# Patient Record
Sex: Male | Born: 1950
Health system: Southern US, Community
[De-identification: ages and names within clinical notes are randomized; demographics above are authoritative.]

## PROBLEM LIST (undated history)

## (undated) DIAGNOSIS — G4733 Obstructive sleep apnea (adult) (pediatric): Secondary | ICD-10-CM

## (undated) DIAGNOSIS — I2510 Atherosclerotic heart disease of native coronary artery without angina pectoris: Secondary | ICD-10-CM

## (undated) DIAGNOSIS — I219 Acute myocardial infarction, unspecified: Secondary | ICD-10-CM

## (undated) DIAGNOSIS — M5136 Other intervertebral disc degeneration, lumbar region: Secondary | ICD-10-CM

## (undated) DIAGNOSIS — M545 Low back pain, unspecified: Secondary | ICD-10-CM

## (undated) DIAGNOSIS — F419 Anxiety disorder, unspecified: Secondary | ICD-10-CM

## (undated) DIAGNOSIS — F1120 Opioid dependence, uncomplicated: Secondary | ICD-10-CM

## (undated) DIAGNOSIS — G8929 Other chronic pain: Secondary | ICD-10-CM

## (undated) DIAGNOSIS — E7800 Pure hypercholesterolemia, unspecified: Secondary | ICD-10-CM

## (undated) DIAGNOSIS — N200 Calculus of kidney: Secondary | ICD-10-CM

## (undated) DIAGNOSIS — Z8679 Personal history of other diseases of the circulatory system: Secondary | ICD-10-CM

## (undated) DIAGNOSIS — Z9989 Dependence on other enabling machines and devices: Secondary | ICD-10-CM

## (undated) DIAGNOSIS — I499 Cardiac arrhythmia, unspecified: Secondary | ICD-10-CM

## (undated) DIAGNOSIS — I10 Essential (primary) hypertension: Secondary | ICD-10-CM

## (undated) DIAGNOSIS — M51369 Other intervertebral disc degeneration, lumbar region without mention of lumbar back pain or lower extremity pain: Secondary | ICD-10-CM

## (undated) DIAGNOSIS — I340 Nonrheumatic mitral (valve) insufficiency: Secondary | ICD-10-CM

## (undated) DIAGNOSIS — F329 Major depressive disorder, single episode, unspecified: Secondary | ICD-10-CM

## (undated) DIAGNOSIS — F32A Depression, unspecified: Secondary | ICD-10-CM

## (undated) DIAGNOSIS — I5189 Other ill-defined heart diseases: Secondary | ICD-10-CM

## (undated) DIAGNOSIS — M109 Gout, unspecified: Secondary | ICD-10-CM

## (undated) DIAGNOSIS — Z87442 Personal history of urinary calculi: Secondary | ICD-10-CM

## (undated) HISTORY — PX: COLONOSCOPY: SHX174

## (undated) HISTORY — PX: CYST EXCISION: SHX5701

## (undated) HISTORY — PX: CYSTOSCOPY W/ STONE MANIPULATION: SHX1427

---

## 1988-04-08 DIAGNOSIS — M5127 Other intervertebral disc displacement, lumbosacral region: Secondary | ICD-10-CM | POA: Insufficient documentation

## 1998-10-05 ENCOUNTER — Ambulatory Visit (HOSPITAL_BASED_OUTPATIENT_CLINIC_OR_DEPARTMENT_OTHER): Admission: RE | Admit: 1998-10-05 | Discharge: 1998-10-05 | Payer: Self-pay | Admitting: *Deleted

## 1999-05-30 ENCOUNTER — Ambulatory Visit (HOSPITAL_COMMUNITY): Admission: RE | Admit: 1999-05-30 | Discharge: 1999-05-30 | Payer: Self-pay | Admitting: Orthopedic Surgery

## 1999-05-30 ENCOUNTER — Encounter: Payer: Self-pay | Admitting: Orthopedic Surgery

## 2007-06-21 ENCOUNTER — Ambulatory Visit (HOSPITAL_BASED_OUTPATIENT_CLINIC_OR_DEPARTMENT_OTHER): Admission: RE | Admit: 2007-06-21 | Discharge: 2007-06-21 | Payer: Self-pay | Admitting: Otolaryngology

## 2007-06-28 ENCOUNTER — Ambulatory Visit: Payer: Self-pay | Admitting: Internal Medicine

## 2008-07-18 ENCOUNTER — Emergency Department (HOSPITAL_COMMUNITY): Admission: EM | Admit: 2008-07-18 | Discharge: 2008-07-19 | Payer: Self-pay | Admitting: Emergency Medicine

## 2008-07-19 ENCOUNTER — Inpatient Hospital Stay (HOSPITAL_COMMUNITY): Admission: AD | Admit: 2008-07-19 | Discharge: 2008-07-20 | Payer: Self-pay | Admitting: *Deleted

## 2008-07-19 ENCOUNTER — Ambulatory Visit: Payer: Self-pay | Admitting: *Deleted

## 2010-07-18 LAB — COMPREHENSIVE METABOLIC PANEL
ALT: 54 U/L — ABNORMAL HIGH (ref 0–53)
AST: 36 U/L (ref 0–37)
Albumin: 3.9 g/dL (ref 3.5–5.2)
Alkaline Phosphatase: 58 U/L (ref 39–117)
CO2: 27 mEq/L (ref 19–32)
Chloride: 104 mEq/L (ref 96–112)
GFR calc Af Amer: >60 mL/min (ref >60)
Potassium: 3.6 mEq/L (ref 3.5–5.1)
Total Bilirubin: 0.9 mg/dL (ref 0.3–1.2)

## 2010-07-18 LAB — ACETAMINOPHEN LEVEL: Acetaminophen (Tylenol), Serum: <10 ug/mL — ABNORMAL LOW (ref 10–30)

## 2010-07-18 LAB — CBC: Platelets: 146 10*3/uL — ABNORMAL LOW (ref 150–400)

## 2010-07-18 LAB — RAPID URINE DRUG SCREEN, HOSP PERFORMED
Amphetamines: NOT DETECTED
Barbiturates: NOT DETECTED
Benzodiazepines: POSITIVE — AB
Cocaine: NOT DETECTED
Opiates: POSITIVE — AB
Tetrahydrocannabinol: NOT DETECTED

## 2010-07-18 LAB — ETHANOL: Alcohol, Ethyl (B): 47 mg/dL — ABNORMAL HIGH (ref 0–10)

## 2010-08-21 NOTE — Procedures (Signed)
NAME:  Terry Cook, Terry Cook               ACCOUNT NO.:  [id number]   MEDICAL RECORD NO.:  [id number]          PATIENT TYPE:  OUT   LOCATION:  SLEEP CENTER                 FACILITY:  [facility]   PHYSICIAN:  Clinton D. Maple Hudson, MD, FCCP, FACPDATE OF BIRTH:  1950/10/24   DATE OF STUDY:  06/21/2007                            NOCTURNAL POLYSOMNOGRAM   REFERRING PHYSICIAN:  Antony Contras, MD   INDICATIONS FOR PROCEDURE:  Hypersomnia with sleep apnea.   RESULTS:  Epward sleepiness score 11/24, BMI 27.7, weight 177 pounds,  height 67 inches, neck 17 inches.   HOME MEDICATIONS:  Charted and reviewed.   SLEEP ARCHITECTURE:  Split study protocol. During the diagnostic phase,  total sleep time was 125.5 minutes with sleep efficiency 97.3%. Stage 1  was 6.8%; Stage 2 was 93.2%. Stage 3 was absent. REM was absent. Sleep  latency 3 minutes. Awake after sleep onset 0.5 minutes. Arousal index  10.5. Bedtime mediation Ambien.   RESPIRATORY DATA:  Apnea hypopnea index (AHI) 58.8, indicating severe  obstructive sleep apnea/hypopnea syndrome before CPAP. This included 9  obstructive apnea's and 114 hypopnea's before CPAP. CPAP was titrated to  12 CWP, AHI 3.2 per hour. He chose a medium Mirage Quattro mask with  heated humidifier.   OXYGEN DATA:  Loud snoring with oxygen desaturation to a nadir of 74%  before CPAP. After CPAP control, saturation held 95.9% on room air.   CARDIAC DATA:  Sinus rhythm.   MOVEMENT/PARASOMNIA:  No significant movement disturbance. No bathroom  trips.   IMPRESSION/RECOMMENDATIONS:  1. Severe obstructive sleep apnea/hypopnea syndrome, AHI 58.8 per      hour, with most events recorded while supine. Loud snoring with      oxygen desaturation to a nadir of 74%.  2. Successful CPAP titration to 12 CWP, AHI 3.2 per hour. The patient      chose a medium Mirage Quattro mask with heated humidifier.     Clinton D. Maple Hudson, MD, [facility], FACP  Diplomate, Biomedical engineer of Sleep Medicine  Electronically Signed    CDY/MEDQ  D:  06/27/2007 10:55:10  T:  06/27/2007 12:07:33  Job:  [id number]

## 2010-08-24 NOTE — Discharge Summary (Signed)
NAME:  Terry Cook, Terry Cook NO.:  [id number]   MEDICAL RECORD NO.:  [id number]          PATIENT TYPE:  IPS   LOCATION:  0305                          FACILITY:  [facility]   PHYSICIAN:  Jasmine Pang, [name] DATE OF BIRTH:  09-02-1950   DATE OF ADMISSION:  07/19/2008  DATE OF DISCHARGE:  07/20/2008                               DISCHARGE SUMMARY   IDENTIFICATION:  This is a 61 year old white male from [place], who  was admitted due to being suicidal.   HISTORY OF PRESENT ILLNESS:  The patient stated he has occupational  stressors.  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  He also has very large debt.  He  took Xanax and thought it would relax him to go through with suicide  plan.  He states that he came to the realization that he cannot commit  suicide.  He will have to let his family know about the financial  problems.  He very much wanted to go home today.  For further admission  information, see psychiatric admission assessment.   PHYSICAL FINDINGS:  There were no acute physical or medical problems  noted.   HOSPITAL COURSE:  Upon admission, the patient was restarted on his  simvastatin 80 mg daily and Benicar/hydrochlorothiazide 40/25 mg daily  and aspirin 81 mg daily.  He was also started on Ambien 5 mg p.o. q.h.s.  p.r.n. and Librium 25 mg p.o. q.4 h. p.r.n. symptoms of withdrawal and  Motrin 400 mg p.o. q.6 h. p.r.n. pain.  It was also ordered that he will  use a CPAP machine.  On July 20, 2008, the patient was started on  alprazolam 0.5 mg p.o. q.i.d. p.r.n.  He states this has helped him at  home.  The Ambien was increased to 10 mg p.o. q.h.s. and was also  started on Celexa 20 mg p.o. q. day.  In individual sessions, the  patient again states he came to realization that he cannot commit  suicide.  He does not want to kill himself.  He realizes that he is  going to have to let his family know about the financial problems.  Social worker spoke with the patient's wife and  she was only comfortable  with the patient coming home if he was on antidepressant.  She also  wanted a family session, which was done on July 20, 2008.  Counselor  met with the patient and his wife.  Wife confirmed the gun has been  removed from the home.  She was adamant that he needed to be on  medication for depression and that he needed to be in therapy when he  leaves.  He agreed to this.  Wife then felt safe with him coming home  and realized that he was not going to get significant one-to-one therapy  while in the hospital as she had originally thought.   DISCHARGE DIAGNOSES:  Axis I:  Major depression, recurrent, severe  without psychosis.  Axis II:  None.  Axis III:  Hypercholesterolemia, hypertension.  Axis IV:  Severe (problems with financial stressors, problems with  primary support group, burden  of psychiatric illness).  Axis V:  Global assessment of functioning was 50 upon discharge.  GAF  was 40 upon admission.  GAF highest past year was 75.   DISCHARGE PLAN:  There was no specific activity level or dietary  restrictions.   POSTHOSPITAL CARE PLANS:  The patient will go to the [facility]  Intensive Outpatient Program on April 15th at 8:45 a.m.   DISCHARGE MEDICATIONS:  1. Aspirin 81 mg daily.  2. Benicar 40 mg/hydrochlorothiazide daily.  3. Simvastatin 80 mg daily.  4. Alprazolam as directed.      Jasmine Pang, [name]  Electronically Signed     BHS/MEDQ  D:  08/01/2008  T:  08/02/2008  Job:  [id number]

## 2011-05-10 DIAGNOSIS — I219 Acute myocardial infarction, unspecified: Secondary | ICD-10-CM

## 2011-05-10 HISTORY — PX: CORONARY ANGIOPLASTY WITH STENT PLACEMENT: SHX49

## 2011-05-10 HISTORY — DX: Acute myocardial infarction, unspecified: I21.9

## 2011-06-03 ENCOUNTER — Inpatient Hospital Stay (HOSPITAL_COMMUNITY)
Admission: EM | Admit: 2011-06-03 | Discharge: 2011-06-06 | DRG: 247 | Disposition: A | Discharge status: Home / Self Care | Payer: Self-pay | Attending: Cardiology | Admitting: Cardiology

## 2011-06-03 ENCOUNTER — Encounter (HOSPITAL_COMMUNITY): Payer: Self-pay | Admitting: *Deleted

## 2011-06-03 ENCOUNTER — Emergency Department (HOSPITAL_COMMUNITY): Payer: Self-pay

## 2011-06-03 ENCOUNTER — Other Ambulatory Visit: Payer: Self-pay

## 2011-06-03 DIAGNOSIS — E876 Hypokalemia: Secondary | ICD-10-CM | POA: Diagnosis not present

## 2011-06-03 DIAGNOSIS — N138 Other obstructive and reflux uropathy: Secondary | ICD-10-CM | POA: Diagnosis present

## 2011-06-03 DIAGNOSIS — N401 Enlarged prostate with lower urinary tract symptoms: Secondary | ICD-10-CM | POA: Diagnosis present

## 2011-06-03 DIAGNOSIS — Z7982 Long term (current) use of aspirin: Secondary | ICD-10-CM

## 2011-06-03 DIAGNOSIS — F411 Generalized anxiety disorder: Secondary | ICD-10-CM | POA: Diagnosis present

## 2011-06-03 DIAGNOSIS — E785 Hyperlipidemia, unspecified: Secondary | ICD-10-CM

## 2011-06-03 DIAGNOSIS — Z7902 Long term (current) use of antithrombotics/antiplatelets: Secondary | ICD-10-CM

## 2011-06-03 DIAGNOSIS — Z79899 Other long term (current) drug therapy: Secondary | ICD-10-CM

## 2011-06-03 DIAGNOSIS — D696 Thrombocytopenia, unspecified: Secondary | ICD-10-CM | POA: Diagnosis not present

## 2011-06-03 DIAGNOSIS — I2119 ST elevation (STEMI) myocardial infarction involving other coronary artery of inferior wall: Principal | ICD-10-CM

## 2011-06-03 DIAGNOSIS — Z6831 Body mass index (BMI) 31.0-31.9, adult: Secondary | ICD-10-CM

## 2011-06-03 DIAGNOSIS — I214 Non-ST elevation (NSTEMI) myocardial infarction: Secondary | ICD-10-CM

## 2011-06-03 DIAGNOSIS — E669 Obesity, unspecified: Secondary | ICD-10-CM | POA: Diagnosis present

## 2011-06-03 DIAGNOSIS — R079 Chest pain, unspecified: Secondary | ICD-10-CM

## 2011-06-03 DIAGNOSIS — R339 Retention of urine, unspecified: Secondary | ICD-10-CM | POA: Diagnosis present

## 2011-06-03 DIAGNOSIS — Z8249 Family history of ischemic heart disease and other diseases of the circulatory system: Secondary | ICD-10-CM

## 2011-06-03 DIAGNOSIS — I10 Essential (primary) hypertension: Secondary | ICD-10-CM | POA: Diagnosis present

## 2011-06-03 DIAGNOSIS — Z955 Presence of coronary angioplasty implant and graft: Secondary | ICD-10-CM

## 2011-06-03 DIAGNOSIS — I2510 Atherosclerotic heart disease of native coronary artery without angina pectoris: Secondary | ICD-10-CM

## 2011-06-03 DIAGNOSIS — G4733 Obstructive sleep apnea (adult) (pediatric): Secondary | ICD-10-CM

## 2011-06-03 HISTORY — DX: Atherosclerotic heart disease of native coronary artery without angina pectoris: I25.10

## 2011-06-03 HISTORY — DX: Obstructive sleep apnea (adult) (pediatric): Z99.89

## 2011-06-03 HISTORY — DX: Essential (primary) hypertension: I10

## 2011-06-03 HISTORY — DX: Obstructive sleep apnea (adult) (pediatric): G47.33

## 2011-06-03 HISTORY — DX: Pure hypercholesterolemia, unspecified: E78.00

## 2011-06-03 LAB — POCT I-STAT, CHEM 8
Creatinine, Ser: 1.1 mg/dL (ref 0.50–1.35)
HCT: 47 % (ref 39.0–52.0)
Hemoglobin: 16 g/dL (ref 13.0–17.0)
Sodium: 141 mEq/L (ref 135–145)
TCO2: 32 mmol/L (ref 0–100)

## 2011-06-03 LAB — CBC
MCH: 32.2 pg (ref 26.0–34.0)
MCHC: 35.7 g/dL (ref 30.0–36.0)
Platelets: 124 10*3/uL — ABNORMAL LOW (ref 150–400)
RDW: 12.7 % (ref 11.5–15.5)

## 2011-06-03 LAB — POCT I-STAT TROPONIN I: Troponin i, poc: 6.08 ng/mL (ref 0.00–0.08)

## 2011-06-03 MED ORDER — HEPARIN (PORCINE) IN NACL 100-0.45 UNIT/ML-% IJ SOLN
1200.0000 [IU]/h | INTRAMUSCULAR | Status: DC
  Administered 2011-06-03: 1000 [IU]/h via INTRAVENOUS
  Filled 2011-06-03 (×2): qty 250

## 2011-06-03 MED ORDER — ZOLPIDEM TARTRATE 5 MG PO TABS: 10.0000 mg | ORAL_TABLET | Freq: Once | ORAL | Status: DC

## 2011-06-03 MED ORDER — CITALOPRAM HYDROBROMIDE 20 MG PO TABS
20.0000 mg | ORAL_TABLET | Freq: Once | ORAL | Status: AC
  Administered 2011-06-04: 20 mg via ORAL
  Filled 2011-06-03: qty 1

## 2011-06-03 MED ORDER — HEPARIN BOLUS VIA INFUSION
4000.0000 [IU] | Freq: Once | INTRAVENOUS | Status: AC
  Administered 2011-06-03: 4000 [IU] via INTRAVENOUS

## 2011-06-03 MED ORDER — POTASSIUM CHLORIDE CRYS ER 20 MEQ PO TBCR
20.0000 meq | EXTENDED_RELEASE_TABLET | Freq: Once | ORAL | Status: AC
  Administered 2011-06-04: 20 meq via ORAL
  Filled 2011-06-03: qty 1

## 2011-06-03 MED ORDER — LOSARTAN POTASSIUM-HCTZ 100-25 MG PO TABS: 1.0000 | ORAL_TABLET | Freq: Every day | ORAL | Status: DC

## 2011-06-03 MED ORDER — HYDROCHLOROTHIAZIDE 12.5 MG PO CAPS
25.0000 mg | ORAL_CAPSULE | Freq: Once | ORAL | Status: AC
  Administered 2011-06-04: 25 mg via ORAL
  Filled 2011-06-03 (×2): qty 2

## 2011-06-03 MED ORDER — CITALOPRAM HYDROBROMIDE 20 MG PO TABS
20.0000 mg | ORAL_TABLET | Freq: Every day | ORAL | Status: DC
  Administered 2011-06-04 – 2011-06-06 (×3): 20 mg via ORAL
  Filled 2011-06-03 (×3): qty 1

## 2011-06-03 MED ORDER — LOSARTAN POTASSIUM 50 MG PO TABS
100.0000 mg | ORAL_TABLET | Freq: Once | ORAL | Status: AC
  Administered 2011-06-04: 100 mg via ORAL
  Filled 2011-06-03: qty 2

## 2011-06-03 MED ORDER — HEPARIN SODIUM (PORCINE) 5000 UNIT/ML IJ SOLN
INTRAMUSCULAR | Status: AC
  Administered 2011-06-03: 5000 [IU]
  Filled 2011-06-03: qty 1

## 2011-06-03 MED ORDER — ZOLPIDEM TARTRATE 5 MG PO TABS: 10.0000 mg | ORAL_TABLET | Freq: Every day | ORAL | Status: DC

## 2011-06-03 MED ORDER — NITROGLYCERIN 0.4 MG SL SUBL
0.4000 mg | SUBLINGUAL_TABLET | SUBLINGUAL | Status: AC | PRN
  Administered 2011-06-03 (×5): 0.4 mg via SUBLINGUAL

## 2011-06-03 MED ORDER — HYDROCODONE-ACETAMINOPHEN 5-325 MG PO TABS
1.0000 | ORAL_TABLET | Freq: Four times a day (QID) | ORAL | Status: DC | PRN
  Administered 2011-06-03: 1 via ORAL
  Filled 2011-06-03: qty 1

## 2011-06-03 NOTE — H&P (Addendum)
See dictated note # [id number].  Patient with NSTEMi now Cp free.  Admit to CCU.  Cath in am.  On ASA and Heparin. Add Integrillin, metoprolol and statin     Patient seen and examined.  No interval change in history and exam since last note   Stable for procedure.  Darden Palmer. MD [facility]  06/04/2011

## 2011-06-03 NOTE — ED Provider Notes (Signed)
History     CSN: [id number]  Arrival date & time 06/03/11  1702   First MD Initiated Contact with Patient 06/03/11 1932      Chief Complaint  Patient presents with  . Chest Pain    Patient is a 61 y.o. male presenting with chest pain. The history is provided by the patient.  Chest Pain The chest pain began 3 - 5 days ago. Duration of episode(s) is 24 hours. Chest pain occurs frequently. The chest pain is worsening. The pain is associated with exertion. The severity of the pain is moderate. The quality of the pain is described as pressure-like. The pain does not radiate. Chest pain is worsened by exertion. Primary symptoms include shortness of breath, nausea and dizziness.  Dizziness also occurs with nausea and diaphoresis.  Associated symptoms include diaphoresis.    Pt reports episodes of chest pain for past 3 days, with the last episode >24 hours He reports diaphoresis/nausea/SOB He took an ASA today He denies known h/o CAD He denies abd pain No pleuritic type pain No recent surgery/travel No recent rectal bleeding reported Past Medical History  Diagnosis Date  . Hypertension   . Sleep apnea     No past surgical history on file.  History reviewed. No pertinent family history.  History  Substance Use Topics  . Smoking status: Never Smoker   . Smokeless tobacco: Not on file  . Alcohol Use: No      Review of Systems  Constitutional: Positive for diaphoresis.  Respiratory: Positive for shortness of breath.   Cardiovascular: Positive for chest pain.  Gastrointestinal: Positive for nausea.  Neurological: Positive for dizziness.  All other systems reviewed and are negative.    Allergies  Review of patient's allergies indicates no known allergies.  Home Medications   Current Outpatient Rx  Name Route Sig Dispense Refill  . ASPIRIN EC 81 MG PO TBEC Oral Take 81 mg by mouth daily.    Marland Kitchen CITALOPRAM HYDROBROMIDE 20 MG PO TABS Oral Take 20 mg by mouth daily.    Marland Kitchen  HYDROCODONE-ACETAMINOPHEN 5-500 MG PO TABS Oral Take 1-2 tablets by mouth every 4 (four) hours as needed. For pain    . LOSARTAN POTASSIUM-HCTZ 100-25 MG PO TABS Oral Take 1 tablet by mouth daily.    Marland Kitchen SIMVASTATIN 40 MG PO TABS Oral Take 40 mg by mouth at bedtime.    Marland Kitchen ZOLPIDEM TARTRATE 10 MG PO TABS Oral Take 10 mg by mouth at bedtime.      BP 161/101  Pulse 67  Resp 20  SpO2 98% CONSTITUTIONAL: Well developed/well nourished HEAD AND FACE: Normocephalic/atraumatic EYES: EOMI/PERRL ENMT: Mucous membranes moist NECK: supple no meningeal signs SPINE:entire spine nontender CV: S1/S2 noted, no murmurs/rubs/gallops noted LUNGS: Lungs are clear to auscultation bilaterally, no apparent distress ABDOMEN: soft, nontender, no rebound or guarding GU:no cva tenderness NEURO: Pt is awake/alert, moves all extremitiesx4 EXTREMITIES: pulses normal, full ROM, no edema SKIN: warm, color normal PSYCH: no abnormalities of mood noted  Physical Exam  ED Course  Procedures   CRITICAL CARE Performed by: Joya Gaskins   Total critical care time: 35  Critical care time was exclusive of separately billable procedures and treating other patients.  Critical care was necessary to treat or prevent imminent or life-threatening deterioration.  Critical care was time spent personally by me on the following activities: development of treatment plan with patient and/or surrogate as well as nursing, discussions with consultants, evaluation of patient's response to treatment, examination of  patient, obtaining history from patient or surrogate, ordering and performing treatments and interventions, ordering and review of laboratory studies, ordering and review of radiographic studies, pulse oximetry and re-evaluation of patient's condition.   Labs Reviewed  CBC - Abnormal; Notable for the following:    WBC 11.3 (*)    Platelets 124 (*)    All other components within normal limits  POCT I-STAT, CHEM 8 -  Abnormal; Notable for the following:    Potassium 3.4 (*)    All other components within normal limits  POCT I-STAT TROPONIN I - Abnormal; Notable for the following:    Troponin i, poc 6.08 (*)    All other components within normal limits  APTT   Dg Chest 2 View  06/03/2011  *RADIOLOGY REPORT*  Clinical Data: Chest pain.  Night sweats.  CHEST - 2 VIEW  Comparison: None.  Findings: Two views of the chest were obtained. Heart and mediastinum are within normal limits.  The trachea is midline.  No evidence for edema or pleural effusions.  Mild degenerative changes in the thoracic spine.  IMPRESSION: No acute chest findings.  Original Report Authenticated By: Richarda Overlie, [name]   8:02 PM Pt with Non-stemi NTG/heparin ordered Pt has already taken ASA today [facility]  8:46 PM D/w dr Donnie Aho, will see patient Pt stabilized in the ED  8:54 PM Patient reports his pain is improved Vitals improved D/w his wife/updated family     MDM  Nursing notes reviewed and considered in documentation All labs/vitals reviewed and considered xrays reviewed and considered     Date: 06/03/2011  Rate: 76  Rhythm: normal sinus rhythm  QRS Axis: normal  Intervals: normal  ST/T Wave abnormalities: nonspecific ST changes  Conduction Disutrbances:none  Narrative Interpretation:   Old EKG Reviewed: unchanged    Joya Gaskins, MD 06/03/11 2054

## 2011-06-03 NOTE — ED Notes (Signed)
Pt reports that he is pain free at this time after NTG x3 doses.  Denies N/V and headache.

## 2011-06-03 NOTE — ED Notes (Signed)
Family at bedside. No verbal complaints at this time. Will continue to monitor.

## 2011-06-03 NOTE — ED Notes (Signed)
Pt reports that he uses CPAP at home but did not bring it with him.  Called RT to inquire as to whether or not he needed to have someone to deliver it to the hospital.  RT assured me that there would be one available when the pt gets to a room. Informed pt.

## 2011-06-03 NOTE — ED Notes (Signed)
To ed for eval of cp and night sweats. States pain started 3 days ago. Describes pain as intermittent from crushing to a knife. Skin w/d, resp e/u. Poor historian.

## 2011-06-03 NOTE — ED Notes (Signed)
Pt reports that his CP is beginning to return and is now at 4/10.

## 2011-06-03 NOTE — Progress Notes (Addendum)
ANTICOAGULATION CONSULT NOTE - Initial Consult  Pharmacy Consult for Heparin, Integrilin Indication: Chest pain, ACS  No Known Allergies  Patient Measurements:   Heparin Dosing Weight: 81kg 78ft 6in 178lb, 81 kg   Vital Signs: BP: 161/101 mmHg (02/25 1933) Pulse Rate: 67  (02/25 1933)  Labs:  Basename 06/03/11 1844 06/03/11 1804  HGB 16.0 15.8  HCT 47.0 44.2  PLT -- 124*  APTT -- --  LABPROT -- --  INR -- --  HEPARINUNFRC -- --  CREATININE 1.10 --  CKTOTAL -- --  CKMB -- --  TROPONINI -- --   CrCl is unknown because there is no height on file for the current visit.  Medical History: Past Medical History  Diagnosis Date  . Hypertension   . Sleep apnea     Medications:   (Not in a hospital admission) Patient notes he has taken up to 8 aspirin daily over the last two days in addition to his naproxen.  Assessment: 61 yo M admitted with chest pain and troponin of 6.  Pharmacy consulted to start heparin.  Patient denies recent bleeding, surgery, bleeding.  Goal of Therapy:  Heparin level 0.3-0.7 units/ml   Plan:  Heparin: 4000 units bolus, 1000 units/hr Check heparin level 6h after ggt starts Daily heparin level and CBC   Juliette Merrily Brittle 06/03/2011,8:07 PM  Addendum: Pharmacy now consulted to manage Integrilin for patient. Estimated CrCl > 50 mL/min.  Will start Integrilin 180 mcg/kg x 1 bolus, then IV infusion at 2 mcg/kg/min.   Lorre Munroe, PharmD 06/03/2011, 00:10 AM.

## 2011-06-03 NOTE — ED Notes (Signed)
No old EKG.

## 2011-06-03 NOTE — ED Notes (Signed)
Pt reports that his CP is now 2/10 and tolerable without associated symptoms.  No diaphoresis noted, pt denies n/v at this time.

## 2011-06-04 ENCOUNTER — Other Ambulatory Visit: Payer: Self-pay

## 2011-06-04 ENCOUNTER — Encounter (HOSPITAL_COMMUNITY): Payer: Self-pay | Admitting: *Deleted

## 2011-06-04 ENCOUNTER — Encounter (HOSPITAL_COMMUNITY)
Admission: EM | Disposition: A | Discharge status: Home / Self Care | Payer: Self-pay | Source: Home / Self Care | Attending: Cardiology

## 2011-06-04 DIAGNOSIS — E785 Hyperlipidemia, unspecified: Secondary | ICD-10-CM

## 2011-06-04 DIAGNOSIS — I2510 Atherosclerotic heart disease of native coronary artery without angina pectoris: Secondary | ICD-10-CM

## 2011-06-04 DIAGNOSIS — Z955 Presence of coronary angioplasty implant and graft: Secondary | ICD-10-CM

## 2011-06-04 HISTORY — DX: Atherosclerotic heart disease of native coronary artery without angina pectoris: I25.10

## 2011-06-04 HISTORY — PX: LEFT HEART CATHETERIZATION WITH CORONARY ANGIOGRAM: SHX5451

## 2011-06-04 LAB — CARDIAC PANEL(CRET KIN+CKTOT+MB+TROPI)
CK, MB: 40.1 ng/mL (ref 0.3–4.0)
CK, MB: 22.7 ng/mL (ref 0.3–4.0)
Relative Index: 4 — ABNORMAL HIGH (ref 0.0–2.5)
Total CK: 671 U/L — ABNORMAL HIGH (ref 7–232)
Total CK: 561 U/L — ABNORMAL HIGH (ref 7–232)

## 2011-06-04 LAB — CBC
HCT: 41.2 % (ref 39.0–52.0)
Hemoglobin: 14.6 g/dL (ref 13.0–17.0)
Hemoglobin: 14.7 g/dL (ref 13.0–17.0)
MCH: 32.3 pg (ref 26.0–34.0)
MCHC: 35.4 g/dL (ref 30.0–36.0)
MCV: 90.7 fL (ref 78.0–100.0)
MCV: 91.2 fL (ref 78.0–100.0)
Platelets: 128 10*3/uL — ABNORMAL LOW (ref 150–400)
RBC: 4.53 MIL/uL (ref 4.22–5.81)
RDW: 12.8 % (ref 11.5–15.5)
WBC: 10.5 10*3/uL (ref 4.0–10.5)

## 2011-06-04 LAB — LIPID PANEL
Cholesterol: 133 mg/dL (ref 0–200)
Triglycerides: 147 mg/dL (ref <150)

## 2011-06-04 LAB — BASIC METABOLIC PANEL
Calcium: 9.2 mg/dL (ref 8.4–10.5)
GFR calc Af Amer: 88 mL/min — ABNORMAL LOW (ref >90)
GFR calc non Af Amer: 76 mL/min — ABNORMAL LOW (ref >90)
Sodium: 140 mEq/L (ref 135–145)

## 2011-06-04 LAB — PROTIME-INR: Prothrombin Time: 15.7 seconds — ABNORMAL HIGH (ref 11.6–15.2)

## 2011-06-04 SURGERY — LEFT HEART CATHETERIZATION WITH CORONARY ANGIOGRAM
Anesthesia: LOCAL

## 2011-06-04 MED ORDER — ATORVASTATIN CALCIUM 80 MG PO TABS
80.0000 mg | ORAL_TABLET | Freq: Every day | ORAL | Status: DC
  Administered 2011-06-04 – 2011-06-05 (×2): 80 mg via ORAL
  Filled 2011-06-04 (×3): qty 1

## 2011-06-04 MED ORDER — ASPIRIN EC 81 MG PO TBEC
81.0000 mg | DELAYED_RELEASE_TABLET | Freq: Every day | ORAL | Status: DC
  Administered 2011-06-05 – 2011-06-06 (×2): 81 mg via ORAL
  Filled 2011-06-04 (×3): qty 1

## 2011-06-04 MED ORDER — METOPROLOL TARTRATE 12.5 MG HALF TABLET
12.5000 mg | ORAL_TABLET | Freq: Two times a day (BID) | ORAL | Status: DC
  Administered 2011-06-04: 12.5 mg via ORAL
  Filled 2011-06-04 (×2): qty 1

## 2011-06-04 MED ORDER — TICAGRELOR 90 MG PO TABS
90.0000 mg | ORAL_TABLET | Freq: Two times a day (BID) | ORAL | Status: DC
  Administered 2011-06-05 – 2011-06-06 (×3): 90 mg via ORAL
  Filled 2011-06-04 (×4): qty 1

## 2011-06-04 MED ORDER — ZOLPIDEM TARTRATE 5 MG PO TABS: 10.0000 mg | ORAL_TABLET | Freq: Every evening | ORAL | Status: DC | PRN

## 2011-06-04 MED ORDER — SODIUM CHLORIDE 0.9 % IV SOLN: INTRAVENOUS | Status: DC

## 2011-06-04 MED ORDER — ASPIRIN 81 MG PO CHEW
324.0000 mg | CHEWABLE_TABLET | ORAL | Status: AC
  Administered 2011-06-04: 324 mg via ORAL
  Filled 2011-06-04: qty 4

## 2011-06-04 MED ORDER — SODIUM CHLORIDE 0.9 % IV SOLN: 1.0000 mL/kg/h | INTRAVENOUS | Status: AC

## 2011-06-04 MED ORDER — LOSARTAN POTASSIUM 50 MG PO TABS
100.0000 mg | ORAL_TABLET | Freq: Every day | ORAL | Status: DC
  Administered 2011-06-04 – 2011-06-06 (×3): 100 mg via ORAL
  Filled 2011-06-04 (×3): qty 2

## 2011-06-04 MED ORDER — ACETAMINOPHEN 325 MG PO TABS
650.0000 mg | ORAL_TABLET | ORAL | Status: DC | PRN
  Administered 2011-06-04 – 2011-06-05 (×6): 650 mg via ORAL
  Filled 2011-06-04 (×6): qty 2

## 2011-06-04 MED ORDER — HEPARIN BOLUS VIA INFUSION
1000.0000 [IU] | Freq: Once | INTRAVENOUS | Status: AC
  Administered 2011-06-04: 1000 [IU] via INTRAVENOUS
  Filled 2011-06-04: qty 1000

## 2011-06-04 MED ORDER — LIDOCAINE HCL (PF) 1 % IJ SOLN
INTRAMUSCULAR | Status: AC
  Filled 2011-06-04: qty 30

## 2011-06-04 MED ORDER — NITROGLYCERIN 0.4 MG SL SUBL: 0.4000 mg | SUBLINGUAL_TABLET | SUBLINGUAL | Status: DC | PRN

## 2011-06-04 MED ORDER — NITROGLYCERIN IN D5W 200-5 MCG/ML-% IV SOLN
2.0000 ug/min | INTRAVENOUS | Status: DC
Start: 2011-06-04 — End: 2011-06-06

## 2011-06-04 MED ORDER — ZOLPIDEM TARTRATE 5 MG PO TABS
10.0000 mg | ORAL_TABLET | Freq: Every evening | ORAL | Status: DC | PRN
  Administered 2011-06-05 (×2): 10 mg via ORAL
  Filled 2011-06-04: qty 2
  Filled 2011-06-04 (×2): qty 1

## 2011-06-04 MED ORDER — NITROGLYCERIN 0.2 MG/ML ON CALL CATH LAB
INTRAVENOUS | Status: AC
  Filled 2011-06-04: qty 1

## 2011-06-04 MED ORDER — EPTIFIBATIDE 75 MG/100ML IV SOLN: 2.0000 ug/kg/min | INTRAVENOUS | Status: AC

## 2011-06-04 MED ORDER — HEPARIN SODIUM (PORCINE) 1000 UNIT/ML IJ SOLN
INTRAMUSCULAR | Status: AC
  Filled 2011-06-04: qty 1

## 2011-06-04 MED ORDER — ONDANSETRON HCL 4 MG/2ML IJ SOLN: 4.0000 mg | Freq: Four times a day (QID) | INTRAMUSCULAR | Status: DC | PRN

## 2011-06-04 MED ORDER — METOPROLOL TARTRATE 25 MG PO TABS
25.0000 mg | ORAL_TABLET | Freq: Two times a day (BID) | ORAL | Status: DC
  Administered 2011-06-04 – 2011-06-05 (×2): 25 mg via ORAL
  Filled 2011-06-04 (×4): qty 1

## 2011-06-04 MED ORDER — EPTIFIBATIDE BOLUS VIA INFUSION
180.0000 ug/kg | Freq: Once | INTRAVENOUS | Status: AC
  Administered 2011-06-04: 14600 ug via INTRAVENOUS
  Filled 2011-06-04: qty 20

## 2011-06-04 MED ORDER — ACETAMINOPHEN 325 MG PO TABS: 650.0000 mg | ORAL_TABLET | ORAL | Status: DC | PRN

## 2011-06-04 MED ORDER — MIDAZOLAM HCL 2 MG/2ML IJ SOLN
INTRAMUSCULAR | Status: AC
  Filled 2011-06-04: qty 2

## 2011-06-04 MED ORDER — HYDROCHLOROTHIAZIDE 25 MG PO TABS
25.0000 mg | ORAL_TABLET | Freq: Every day | ORAL | Status: DC
  Administered 2011-06-04: 25 mg via ORAL
  Filled 2011-06-04: qty 1

## 2011-06-04 MED ORDER — NITROGLYCERIN IN D5W 200-5 MCG/ML-% IV SOLN
2.0000 ug/min | INTRAVENOUS | Status: DC
  Administered 2011-06-04: 10 ug/min via INTRAVENOUS
  Filled 2011-06-04 (×2): qty 250

## 2011-06-04 MED ORDER — MORPHINE SULFATE 2 MG/ML IJ SOLN
1.0000 mg | INTRAMUSCULAR | Status: DC | PRN
  Administered 2011-06-04: 1 mg via INTRAVENOUS
  Administered 2011-06-04: 2 mg via INTRAVENOUS
  Filled 2011-06-04: qty 1

## 2011-06-04 MED ORDER — HEPARIN (PORCINE) IN NACL 2-0.9 UNIT/ML-% IJ SOLN
INTRAMUSCULAR | Status: AC
  Filled 2011-06-04: qty 2000

## 2011-06-04 MED ORDER — EPTIFIBATIDE 75 MG/100ML IV SOLN
2.0000 ug/kg/min | INTRAVENOUS | Status: DC
  Administered 2011-06-04 (×2): 2 ug/kg/min via INTRAVENOUS
  Filled 2011-06-04 (×6): qty 100

## 2011-06-04 MED ORDER — ALPRAZOLAM 0.25 MG PO TABS
0.2500 mg | ORAL_TABLET | Freq: Two times a day (BID) | ORAL | Status: DC | PRN
  Administered 2011-06-04: 0.25 mg via ORAL
  Filled 2011-06-04: qty 1

## 2011-06-04 MED ORDER — DIAZEPAM 5 MG PO TABS
10.0000 mg | ORAL_TABLET | ORAL | Status: AC
  Administered 2011-06-04: 10 mg via ORAL
  Filled 2011-06-04: qty 2

## 2011-06-04 MED ORDER — ASPIRIN 300 MG RE SUPP
300.0000 mg | RECTAL | Status: AC
  Filled 2011-06-04: qty 1

## 2011-06-04 MED ORDER — NITROGLYCERIN IN D5W 200-5 MCG/ML-% IV SOLN
INTRAVENOUS | Status: AC
  Administered 2011-06-04: 10 ug/min via INTRAVENOUS
  Filled 2011-06-04: qty 250

## 2011-06-04 MED ORDER — MORPHINE SULFATE 2 MG/ML IJ SOLN
INTRAMUSCULAR | Status: AC
  Administered 2011-06-04: 2 mg via INTRAVENOUS
  Filled 2011-06-04: qty 1

## 2011-06-04 NOTE — CV Procedure (Signed)
Cardiac Catheterization Report   Terry Cook  [birth year]  [id number]  06/04/11   PROCEDURE:  Left heart catheterization with selective coronary angiography, left ventriculogram.  INDICATIONS:  Non-ST elevation myocardial infarction, unstable angina multiple cardiac risks  The risks, benefits, and details of the procedure were explained to the patient.  The patient verbalized understanding and wanted to proceed.  Informed written consent was obtained.  PROCEDURE TECHNIQUE:  After Xylocaine anesthesia a 14F sheath was placed in the right femoral artery with a single anterior needle wall stick.   Left coronary angiography was done using a Judkins L4 guide catheter.  Right coronary angiography was done using a Judkins R4 guide catheter.  A 30 cc left ventriculogram was performed in the 30 RAO projection and the digital images were reviewed. Dr. Eldridge Dace felt that he would be a candidate for percutaneous intervention of the circumflex as the right coronary artery appeared occluded and less favorable for percutaneous intervention. Dr. Eldridge Dace then scrubbed into the case and assumed care for the intervention.   CONTRAST:  Total of 65 cc.  COMPLICATIONS:  None.    HEMODYNAMICS: Aortic postcontrast 107/67, left ventriculogram postcontrast 107/14-22 .There was no gradient between the left ventricle and aorta.    ANGIOGRAPHIC DATA:    CORONARY ARTERIES:   Arise and distribute normally.  Right dominant. Mild coronary calcification is noted.  Left main coronary artery: Normal.  Left anterior descending: Appears to be a twin LAD, the more medial branch has only minor irregularities. The more lateral branch has proximal 30-40% stenosis and gives off a diagonal branch. There appears to be a severe 80-90% stenosis in the distal vessel at the apex.  Circumflex coronary artery: First marginal branch a segmental 60% to 80% proximal stenosis. Following the first  marginal branch is a severe eccentric 99% stenosis with some ulceration prior to additional marginal branches. The second marginal branch has a proximal 60% stenosis. A third marginal branch has proximal irregularity. Collateral filling is seen of the right coronary artery off of the atrial branch and through septal collaterals.   Right coronary artery: Mild proximal stenosis noted. The vessel has a mid segment occlusion estimated 1.5 cm after a right ventricular branch. Very faint antegrade flow is noted. Significant collateral filling of the distal vessel is noted off of the right coronary artery.Marland Kitchen  LEFT VENTRICULOGRAM:  Performed in the 30 RAO projection.  The aortic and mitral valves are normal. The left ventricle is normal in size there is akinesis of the proximal mid and distal inferior wall.. Estimated ejection fraction is 45%..   IMPRESSIONS:  1. Severe coronary artery disease with a severe ulcerated stenosis involving the mid circumflex between the first and second marginal branches. Moderate diffuse disease involving the first and second marginal branches. Total occlusion of the right coronary artery with collateral filling via right to right as well as left left collaterals. Distal disease noted in the LAD. 2. Normal left particular function with akinesis of the inferior wall and ejection fraction 45%  RECOMMENDATION:    Discussed with Dr. Eldridge Dace. After review the films it appears that he has had an inferior infarction but appears to be more of a chronic occlusion but is difficult to say. He does have significant collaterals coming from left to left and right to right. We will attempt at stenting of the circumflex stenosis and determine if he has ongoing ischemia prior to its duration of right coronary intervention. He does have distal stenosis involving the LAD also.  Darden Palmer MD [facility]

## 2011-06-04 NOTE — Progress Notes (Signed)
Patient stable post PCI of circumflex with DES.  He has occluded RCA an only mild LAD disease. Inferior hypokinesis.  Currently agitated and c/o prostate pain and bladder pain.  SHeath in place still and on integrillin.  WIll insert foley until can be off of bedrest. Taper NTG.  Darden Palmer MD [facility]

## 2011-06-04 NOTE — Progress Notes (Signed)
Spoke with Dr. Mayford Knife. Ordered to turn off Integrilin at this time.  Perkins, Terry Cook

## 2011-06-04 NOTE — Progress Notes (Signed)
Dr. Donnie Aho paged at [facility] regarding critical values from previous shift.  Terry Cook

## 2011-06-04 NOTE — H&P (Signed)
NAME:  Terry Cook, Terry Cook NO.:  [id number]  MEDICAL RECORD NO.:  [id number]  LOCATION:  PDA10                        FACILITY:  [facility]  PHYSICIAN:  Natasha Bence, MD       DATE OF BIRTH:  02-21-1951  DATE OF ADMISSION:  06/03/2011 DATE OF DISCHARGE:                             HISTORY & PHYSICAL   PRIMARY PHYSICIAN:  [facility].  CHIEF COMPLAINT:  Chest discomfort.  HISTORY OF PRESENT ILLNESS:  Terry Cook is a 62 year old white male with a history of hypertension who developed substernal chest discomfort approximately 3 days ago while doing some work.  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  He initially thought this was musculoskeletal pain, however, later that night started to have several episodes of this pain associated with diaphoresis and mild shortness of breath.  He denies any nausea or lightheadedness.  He had several episodes throughout the next 2 days, and finally decided to come in today primarily because of the associated known diaphoresis.  In the ED, he received 3 sublingual nitroglycerin with complete relief of his pain. He is currently pain free.  Prior to this episode, he had had no episodes of chest discomfort.  However, he does have mild dyspnea on exertion which he attributes to him being out of shape.  He denies any PND or orthopnea.  He does have intermittent palpitations, not had any episodes of syncope.  He denies any melena or hematochezia.  REVIEW OF SYSTEMS:  Otherwise, review of systems as below.  GENERAL:  No fever, chills, or sweats.  SKIN:  No rash or ulcers.  GI:  No diarrhea, constipation, melena, or hematochezia.  CARDIOVASCULAR:  See HPI. RESPIRATORY:  No PND or orthopnea.  GU:  No dysuria, hematuria. MUSCULOSKELETAL:  He has chronic degenerative joint disease of his back. Otherwise, a complete review of systems was performed was  negative except for what is stated above.  PAST MEDICAL HISTORY: 1. Hypertension. 2. Degenerative joint disease. 3. Obstructive sleep apnea on CPAP.  PAST SURGICAL HISTORY:  None.  FAMILY HISTORY:  He has a strong family history of coronary artery disease.  His father died of sudden cardiac death in his 15s.  Brother had an MI with stent placement in his mid 47s.  SOCIAL HISTORY:  He is a Office manager.  He denies any smoking.  He drinks beer rarely.  No illicit drug use.  ALLERGIES:  He has no known drug allergies.  MEDICATIONS:  Home meds: 1. He is on aspirin 81 mg p.o. daily. 2. Celexa 20 mg p.o. daily. 3. Hydrocodone 5/500 mg 1-2 tablets every 4 hours as needed. 4. Hyzaar 100/25 mg p.o. daily. 5. Zolpidem 10 mg p.o. daily. 6. Simvastatin 40 mg p.o. daily.  PHYSICAL EXAMINATION:  VITAL SIGNS:  He is afebrile.  Temperature 99.1, pulse is 67 and regular, blood pressure 161/101, O2 sats 98% on room air, respiratory rate 20. GENERAL:  He is an overweight white male in no apparent distress. EYES:  He has anicteric sclerae. NECK:  He has normal jugular venous pressure.  No carotid  bruits.  No thyromegaly.  LUNGS:  Clear to auscultation bilaterally. CARDIOVASCULAR:  He has a regular rate and rhythm.  No murmurs, rubs, or gallops.  He does have occasional ectopy. ABDOMEN:  Soft, nontender, nondistended.  No masses or bruits. EXTREMITIES:  Warm with no edema.  He has symmetrical pulses throughout. SKIN:  No rash or ulcers. NEURO:  Grossly afocal.  LABORATORY DATA:  Sodium 141, potassium 3.4, chloride 100, BUN of 15, creatinine of 1.1, glucose of 98, calcium of 1.2.  White blood cell count 11.3, hematocrit of 44, platelet count 124.  Point of care troponin was 6.08, PTT was 35.  Chest x-ray showed no acute infiltrates or effusions.  EKG shows sinus mechanism, rate 76 beats per minute.  He has biatrial enlargement.  He has inferior ST-T changes with Q-waves suggestive of  ischemia.  IMPRESSION AND PLAN:  This is a 61 year old hypertensive male who presents with chest discomfort and non ST elevation myocardial infarction.  His primary event probably occurred 2-3 days ago, and he already has q waves in his inferior leads.  Currently he is pain free and on heparin infusion after receiving aspirin in the emergency department.  We will be admitted to CCU on telemetry.  We will follow serial cardiac enzymes.  In the morning, we will obtain echocardiogram and proceed with catheterization to define his coronary anatomy.  We will also add Integrilin infusion tonight and metoprolol in addition to statin.  Currently, he is pain free and hemodynamically stable.  We will keep him on CPAP for his obstructive sleep apnea.          ______________________________ Natasha Bence, MD     MH/MEDQ  D:  06/03/2011  T:  06/04/2011  Job:  [id number]

## 2011-06-04 NOTE — Progress Notes (Signed)
Nursing: Obtained critical values from lab at 0605 am. Paged cardiology resident at [phone] with no response. Called Dr. York Spaniel office and the answering service was given information to call this RN at 0630 am. No response. Called Dr. York Spaniel office again and gave information required to page at 0720. No response. [place] RN made aware of critical values in report.

## 2011-06-04 NOTE — CV Procedure (Signed)
PROCEDURE:  PCI mid left circumflex  INDICATIONS:  NSTEMI   The risks, benefits, and details of the procedure were explained to the patient.  The patient verbalized understanding and wanted to proceed.  Informed written consent was obtained.  PROCEDURE TECHNIQUE:  Dr. Donnie Aho performed a diagnostic catheterization showing a severe, 90% lesion in the midcircumflex.  A 66F sheath was placed in the right femoral artery in exchange for a 5 French sheath..   Left coronary angiography was done using a CLS 3.5 guide catheter.  Heparin was used for anticoagulation.  An ACT was used to confirm that the heparin is therapeutic.  Integrilin was running during the procedures well.  Pro water wire was placed across the stenosis in the midcircumflex.  A 2.5 x 20 Myrtle balloon was placed across the diseased area and inflated to 8 atmospheres.  A 2.75 x 28 Promus element stent was placed across the diseased area and deployed at 14 atmospheres.  The stent was postdilated with a 3.0 x 20 Sac Quantum Apex balloon inflated to 16 atmospheres.  There is no residual stenosis.  Initial TIMI 2 flow was improved to TIMI 3 flow.  Lesion length was 24 mm.  Intracoronary nitroglycerin was administered several times to help treat distal vessel vasospasm.   CONTRAST:  Total of 50  cc.  COMPLICATIONS:  None.       IMPRESSIONS:  1. Successful PCI of the mid circumflex artery with a 2.75 x 28 Promus element stent, postdilated to greater than 3 mm in diameter.  RECOMMENDATION:  The patient will need to take dual antiplatelet therapy for at least a year.  Continue aggressive secondary prevention.  He'll followup with Dr. Donnie Aho.

## 2011-06-04 NOTE — Progress Notes (Signed)
ANTICOAGULATION CONSULT NOTE - Follow Up  Pharmacy Consult for Heparin, Integrilin Indication: Chest pain, ACS  No Known Allergies  Patient Measurements: Weight: 178 lb 9.2 oz (81 kg) Heparin Dosing Weight: 81kg 28ft 6in 178lb, 81 kg  Vital Signs: BP: 102/49 mmHg (02/26 0330) Pulse Rate: 67  (02/26 0330)  Labs:  Basename 06/04/11 0300 06/04/11 0010 06/03/11 1949 06/03/11 1844 06/03/11 1804  HGB -- 14.6 -- 16.0 --  HCT -- 41.2 -- 47.0 44.2  PLT -- 125* -- -- 124*  APTT -- -- 35 -- --  LABPROT -- -- -- -- --  INR -- -- -- -- --  HEPARINUNFRC 0.24* -- -- -- --  CREATININE -- -- -- 1.10 --  CKTOTAL -- -- -- -- --  CKMB -- -- -- -- --  TROPONINI -- -- -- -- --   CrCl is unknown because there is no height on file for the current visit.  Medical History: Past Medical History  Diagnosis Date  . Hypertension   . Sleep apnea     Medications:   (Not in a hospital admission)  Assessment: 61 yo M admitted with chest pain and troponin of 6. Pharmacy consulted to manage heparin and Integrilin.  Heparin level (0.24) is below-goal on 1000 units/hr.   Goal of Therapy:  Heparin level 0.3 - 0.5 (on Integrilin)   Plan:  1. Heparin IV bolus of 1000 units, then Increase IV heparin to 1200 units/hr  2. Heparin level, CBC in 6 hours.   Emeline Gins 06/04/2011,3:40 AM

## 2011-06-04 NOTE — Progress Notes (Signed)
Chaplain attempted initial visit with patient, but patient was not available. Chaplain may attempt an initial visit at a later time.

## 2011-06-04 NOTE — Progress Notes (Signed)
*  PRELIMINARY RESULTS* Echocardiogram 2D Echocardiogram has been performed.  Jeryl Columbia R 06/04/2011, 11:14 AM

## 2011-06-04 NOTE — Progress Notes (Signed)
Subjective:  Pain free now.  EKG with subtle inferior changes.  Objective:  Vital Signs in the last 24 hours: BP 108/79  Pulse 70  Temp(Src) 98.6 F (37 C) (Oral)  Resp 20  Ht 5\' 5"  (1.651 m)  Wt 84.823 kg (187 lb)  BMI 31.12 kg/m2  SpO2 96%  Physical Exam:  Lungs:  Clear to A&P Cardiac:  Regular rhythm, normal S1 and S2, no S3 Extremities:  No edema present, 2+pulses  Intake/Output from previous day: 02/25 0701 - 02/26 0700 In: 124 [I.V.:124] Out: 200 [Urine:200] Weight change:   Lab Results: Basic Metabolic Panel:  Basename 06/04/11 0442 06/03/11 1844  NA 140 141  K 3.8 3.4*  CL 100 100  CO2 34* --  GLUCOSE 95 98  BUN 13 15  CREATININE 1.04 1.10   CBC:  Basename 06/04/11 0010 06/03/11 1844 06/03/11 1804  WBC 10.9* -- 11.3*  NEUTROABS -- -- --  HGB 14.6 16.0 --  HCT 41.2 47.0 --  MCV 91.2 -- 90.2  PLT 125* -- 124*   Cardiac Enzymes:  Basename 06/04/11 0442  CKTOTAL 671*  CKMB 40.1*  CKMBINDEX --  TROPONINI 13.24*    Protime: . Lab Results  Component Value Date   INR 1.22 06/04/2011    Telemetry: Sinus  Assessment/Plan: \1. NSTEMI initial 2. Strong FH 3. Mild thrombocytopenia  Rec:  Cardiac cath later on. Cardiac catheterization was discussed with the patient fully including risks of myocardial infarction, death, stroke, bleeding, arrhythmia, dye allergy, renal insufficiency or bleeding.  The patient understands and is willing to proceed. Possibility of PCI and intervention discussed with patient to be done by interventional cardiology post cath if necessary.  Terry Cook.  MD [facility] 06/04/2011, 9:11 AM

## 2011-06-04 NOTE — Progress Notes (Signed)
UR Completed. Simmons, Aadyn Buchheit F [phone]  

## 2011-06-04 NOTE — ED Notes (Signed)
Pt. Transferred to floor via stretcher with cardiac monitor and RN

## 2011-06-05 LAB — BASIC METABOLIC PANEL
BUN: 11 mg/dL (ref 6–23)
BUN: 11 mg/dL (ref 6–23)
CO2: 25 mEq/L (ref 19–32)
Chloride: 98 mEq/L (ref 96–112)
Creatinine, Ser: 0.83 mg/dL (ref 0.50–1.35)
GFR calc Af Amer: >90 mL/min (ref >90)
GFR calc non Af Amer: 88 mL/min — ABNORMAL LOW (ref >90)
Glucose, Bld: 114 mg/dL — ABNORMAL HIGH (ref 70–99)
Glucose, Bld: 96 mg/dL (ref 70–99)
Potassium: 3 mEq/L — ABNORMAL LOW (ref 3.5–5.1)

## 2011-06-05 LAB — CBC
HCT: 39.6 % (ref 39.0–52.0)
Hemoglobin: 13.9 g/dL (ref 13.0–17.0)
MCHC: 35.1 g/dL (ref 30.0–36.0)

## 2011-06-05 MED ORDER — ASPIRIN 81 MG PO CHEW
CHEWABLE_TABLET | ORAL | Status: AC
  Filled 2011-06-05: qty 1

## 2011-06-05 MED ORDER — POTASSIUM CHLORIDE CRYS ER 20 MEQ PO TBCR
40.0000 meq | EXTENDED_RELEASE_TABLET | Freq: Once | ORAL | Status: AC
  Administered 2011-06-05: 40 meq via ORAL

## 2011-06-05 MED ORDER — ATROPINE SULFATE 1 MG/ML IJ SOLN
INTRAMUSCULAR | Status: AC
  Filled 2011-06-05: qty 1

## 2011-06-05 MED ORDER — POTASSIUM CHLORIDE 20 MEQ/15ML (10%) PO LIQD: 40.0000 meq | Freq: Two times a day (BID) | ORAL | Status: DC

## 2011-06-05 MED ORDER — ENOXAPARIN SODIUM 40 MG/0.4ML ~~LOC~~ SOLN
40.0000 mg | Freq: Every day | SUBCUTANEOUS | Status: DC
  Administered 2011-06-05 – 2011-06-06 (×2): 40 mg via SUBCUTANEOUS
  Filled 2011-06-05 (×2): qty 0.4

## 2011-06-05 MED ORDER — POTASSIUM CHLORIDE CRYS ER 20 MEQ PO TBCR
EXTENDED_RELEASE_TABLET | ORAL | Status: AC
  Filled 2011-06-05: qty 2

## 2011-06-05 MED ORDER — POTASSIUM CHLORIDE 20 MEQ/15ML (10%) PO LIQD
40.0000 meq | Freq: Two times a day (BID) | ORAL | Status: AC
  Administered 2011-06-05: 40 meq via ORAL
  Filled 2011-06-05: qty 30

## 2011-06-05 MED ORDER — METOPROLOL TARTRATE 50 MG PO TABS
50.0000 mg | ORAL_TABLET | Freq: Two times a day (BID) | ORAL | Status: DC
  Administered 2011-06-05 – 2011-06-06 (×2): 50 mg via ORAL
  Filled 2011-06-05 (×3): qty 1

## 2011-06-05 NOTE — Progress Notes (Signed)
Sheath removed from right groin. Site level 0 pre-removal. Manual pressure applied for 25 minutes beginning at 0033 till 0058. Pt tolerated procedure. No adverse effects. Post sheath removal groin level 0. Post cath instructions given, pulses present, pressure dsg applied. Will continue to monitor site. Perkins, [place] Elizabeth

## 2011-06-05 NOTE — Progress Notes (Signed)
Pt. Has order to wear cpap. Pt has his home cpap present. [employer] was notified but was unable to respond. RT inspected pt cords and machine and the machine appears to be intact.

## 2011-06-05 NOTE — Progress Notes (Signed)
Subjective:  Feels much better this am, denies chest pain or SOB.  Has foley in place for urinary retention last pm.   Objective:  Vital Signs in the last 24 hours: BP 138/88  Pulse 80  Temp(Src) 98.6 F (37 C) (Axillary)  Resp 18  Ht 5\' 5"  (1.651 m)  Wt 84.823 kg (187 lb)  BMI 31.12 kg/m2  SpO2 97%  Physical Exam: Pleasant WM in NAD Lungs:  Clear  Cardiac:  Regular rhythm, normal S1 and S2, no S3 Abdomen:  Soft, nontender, no masses Extremities:  Cath site clean and dry  Intake/Output from previous day: 02/26 0701 - 02/27 0700 In: 1874 [P.O.:30; I.V.:1339; IV Piggyback:505] Out: 2575 [Urine:2575]  Lab Results: Basic Metabolic Panel:  Basename 06/05/11 0620 06/04/11 2340  NA 143 137  K 3.0* 3.2*  CL 103 98  CO2 31 25  GLUCOSE 96 114*  BUN 11 11  CREATININE 0.96 0.83   CBC:  Basename 06/05/11 0620 06/04/11 1024  WBC 8.8 10.5  NEUTROABS -- --  HGB 13.9 14.7  HCT 39.6 41.1  MCV 90.8 90.7  PLT 118* 128*   Cardiac Enzymes:  Basename 06/04/11 1625 06/04/11 1024 06/04/11 0442  CKTOTAL 561* 612* 671*  CKMB 22.7* 31.2* 40.1*  CKMBINDEX -- -- --  TROPONINI 20.56* 11.38* 13.24*    Protime: . Lab Results  Component Value Date   INR 1.22 06/04/2011   EKG:  Inferior MI  Telemetry: Reviewed  Sinus rhtyhm  Assessment/Plan:  1. Acute inferior MI 2. S/p DES 3. Urinary retention 4. Hypokalemia 5. Hypertension  REC:  D/C IV NTG, foley, begin rehab, Increase BP meds.  Move to floor.  Darden Palmer.  MD [facility] 06/05/2011, 9:07 AM

## 2011-06-05 NOTE — Progress Notes (Signed)
75CARDIAC REHAB PHASE I   PRE:  Rate/Rhythm: 75 SR  BP:  Supine: 121/82  Sitting:   Standing:    SaO2:   MODE:  Ambulation: 700 ft   POST:  Rate/Rhythem: 86 SR  BP:  Supine:   Sitting: 135/76  Standing:    SaO2:  1335-1430 Pt tolerated ambulation well without c/o of cp or SOB. VS stable. Started discharge education with pt and wife. He agrees to [employer] in [place], will send referral. Pt has no insurance, will ask case manger to see and gave him financial form to fill out for assistance for [facility].  Beatrix Fetters

## 2011-06-06 DIAGNOSIS — I2119 ST elevation (STEMI) myocardial infarction involving other coronary artery of inferior wall: Secondary | ICD-10-CM

## 2011-06-06 LAB — BASIC METABOLIC PANEL
CO2: 30 mEq/L (ref 19–32)
Chloride: 105 mEq/L (ref 96–112)
Glucose, Bld: 93 mg/dL (ref 70–99)
Potassium: 4.1 mEq/L (ref 3.5–5.1)
Sodium: 142 mEq/L (ref 135–145)

## 2011-06-06 MED ORDER — NITROGLYCERIN 0.4 MG SL SUBL: 0.4000 mg | SUBLINGUAL_TABLET | SUBLINGUAL | Status: DC | PRN

## 2011-06-06 MED ORDER — ATORVASTATIN CALCIUM 80 MG PO TABS: 80.0000 mg | ORAL_TABLET | Freq: Every day | ORAL | Status: DC

## 2011-06-06 MED ORDER — ASPIRIN 81 MG PO TBEC: 81.0000 mg | DELAYED_RELEASE_TABLET | Freq: Every day | ORAL | Status: AC

## 2011-06-06 MED ORDER — TICAGRELOR 90 MG PO TABS: 90.0000 mg | ORAL_TABLET | Freq: Two times a day (BID) | ORAL | Status: DC

## 2011-06-06 MED ORDER — METOPROLOL TARTRATE 50 MG PO TABS: 50.0000 mg | ORAL_TABLET | Freq: Two times a day (BID) | ORAL | Status: DC

## 2011-06-06 NOTE — Progress Notes (Signed)
Pt discharge education performed. Pt verbalized understanding. IV's removed, no bleeding noted, catheter intact. D/Ced telemetry

## 2011-06-06 NOTE — Progress Notes (Signed)
[id number] Cardiac Rehab Completed discharge education with pt. He voices understanding.

## 2011-06-06 NOTE — Progress Notes (Signed)
   CARE MANAGEMENT NOTE 06/06/2011  Patient:  Terry Cook, Terry Cook   Account Number:  [id number]  Date Initiated:  06/06/2011  Documentation initiated by:  GRAVES-BIGELOW,Tyresa Prindiville  Subjective/Objective Assessment:   Pt admitted with cp Acute inferior myocardial infarction-initial episode. Plan for home on Brilinta.     Action/Plan:   CM did call [facility] pharmacy and they have a bottle of brilinta available. CM did provide pt with a 30 day free card. Pt stated he will have insurance by April 1st. Pt in process of having house forelocsed and possible bankruptcy.   Anticipated DC Date:  06/06/2011   Anticipated DC Plan:  HOME/SELF CARE      DC Planning Services  CM consult      Choice offered to / List presented to:             Status of service:  Completed, signed off Medicare Important Message given?   (If response is "NO", the following Medicare IM given date fields will be blank) Date Medicare IM given:   Date Additional Medicare IM given:    Discharge Disposition:  HOME/SELF CARE  Per UR Regulation:    Comments:  06-06-11 1440 Tomi Bamberger, RN,BSN [phone] CM did supply pt with pt assist forms and he will take forms  back to MD [facility] office and have him fill out his part and then will fax to co. CM relayed to pt not to miss the medication and he see's that he has nt heard anything from pt assist AZ&ME to call MD and ask for samples.

## 2011-06-06 NOTE — Discharge Summary (Signed)
Physician Discharge Summary  Patient ID: CADENCE HASLAM MRN: [id number] DOB/AGE: January 29, 1951 61 y.o.  Admit date: 06/03/2011 Discharge date: 06/06/2011  Primary Physician: Dr. Juluis Rainier  Primary Discharge Diagnosis: 1. Acute inferior myocardial infarction-initial episode  Secondary Discharge Diagnosis: 2. Hypertension 3. Hyperlipidemia 4. Obesity 5. Sleep apnea 6. Anxiety 7 BPH  Procedures::  1. Cardiac catheterization 2. Drug-eluting stent placement to the midportion of the circumflex with a Promus drug-eluting stent 3. Echocardiogram  Hospital Course: Patient is a 61 year old male with previous history of hypertension and hyperlipidemia as well as sleep apnea. He presented to the emergency room with a three-day history of chest discomfort described as tightness across his chest that is been intermittent and associated with shortness of breath. He presented to the emergency room her he was found L. of elevation of troponin and CPK with minor changes inferiorly. He was admitted to rule out a myocardial infarction.  The patient was placed on heparin and later Integrilin. He was given beta blockers. He had significant elevation of troponin but was pain-free. He was taken to the cardiac catheterization laboratory the next day and was found to have a severe stenosis involving the mid circumflex vessel. Both of the first and second marginal branches were moderately diseased at their proximal portions. The right coronary artery appeared occluded in the midportion and had a long segment of occlusion and appeared more chronic. There is also a severe stenosis in the distal apex of the LAD. Ejection fraction was 40-45% with inferior hypokinesis. Echocardiogram showed a significantly akinetic inferior wall.  After review of the films Dr. Eldridge Dace did PCI with a drug-eluting stent to the mid circumflex. He had no recurrent chest discomfort following that and was moved to the floor. He did  develop urinary retention and required placement of a Foley catheterization following the catheterization. He was ambulatory in the hall and had no recurrent chest discomfort and is discharged home in improved condition. He was seen by cardiac rehabilitation and was given significant instruction concerning activity.  Discharge Exam: Blood pressure 126/80, pulse 59, temperature 97.9 F (36.6 C), temperature source Oral, resp. rate 20, height 5\' 5"  (1.651 m), weight 84.823 kg (187 lb), SpO2 94.00%.   He was stable the day of discharge with clear lungs and a normal cardiac exam. Catheterization site had no ecchymosis  Labs: CBC:   Lab Results  Component Value Date   WBC 8.8 06/05/2011   HGB 13.9 06/05/2011   HCT 39.6 06/05/2011   MCV 90.8 06/05/2011   PLT 118* 06/05/2011   CMP:  Lab 06/06/11 0545  NA 142  K 4.1  CL 105  CO2 30  BUN 15  CREATININE 1.03  CALCIUM 9.5  PROT --  BILITOT --  ALKPHOS --  ALT --  AST --  GLUCOSE 93   Lipid Panel     Component Value Date/Time   CHOL 133 06/04/2011 0442   TRIG 147 06/04/2011 0442   HDL 35* 06/04/2011 0442   CHOLHDL 3.8 06/04/2011 0442   VLDL 29 06/04/2011 0442   LDLCALC 69 06/04/2011 0442   Cardiac Enzymes:  Basename 06/04/11 1625 06/04/11 1024 06/04/11 0442  CKTOTAL 561* 612* 671*  CKMB 22.7* 31.2* 40.1*  CKMBINDEX -- -- --  TROPONINI 20.56* 11.38* 13.24*    Radiology: Clear lung fields, mild degenerative changes in the spine.  EKG: Inferior myocardial infarction that evolved  Discharge Medications:  Rohn, Fritsch  Home Medication Instructions [id number]   Printed on:06/06/11 1006  Medication  Information                    losartan-hydrochlorothiazide (HYZAAR) 100-25 MG per tablet Take 1 tablet by mouth daily.           aspirin EC 81 MG tablet Take 81 mg by mouth daily.           citalopram (CELEXA) 20 MG tablet Take 20 mg by mouth daily.           zolpidem (AMBIEN) 10 MG tablet Take 10 mg by mouth at  bedtime.           aspirin EC 81 MG EC tablet Take 1 tablet (81 mg total) by mouth daily.           atorvastatin (LIPITOR) 80 MG tablet Take 1 tablet (80 mg total) by mouth daily at 6 PM.           metoprolol (LOPRESSOR) 50 MG tablet Take 1 tablet (50 mg total) by mouth 2 (two) times daily.           Ticagrelor (BRILINTA) 90 MG TABS tablet Take 1 tablet (90 mg total) by mouth 2 (two) times daily.           nitroGLYCERIN (NITROSTAT) 0.4 MG SL tablet Place 1 tablet (0.4 mg total) under the tongue every 5 (five) minutes x 3 doses as needed for chest pain.             Followup plans and appointments:  He is to be out of work for 2-3 weeks. He will follow a carbohydrate modified diet of the Mediterranean type such as the [employer]. Followup in one week and consider treadmill testing in 2-3 weeks. Report any recurrent chest discomfort.  Time spent with patient to include physician time: 45 minutes  Signed: W. Ashley Royalty. MD [facility] 06/06/2011, 10:06 AM

## 2011-07-08 DEATH — deceased

## 2012-03-30 ENCOUNTER — Ambulatory Visit (INDEPENDENT_AMBULATORY_CARE_PROVIDER_SITE_OTHER): Payer: BC Managed Care – PPO | Admitting: *Deleted

## 2012-03-30 DIAGNOSIS — I739 Peripheral vascular disease, unspecified: Secondary | ICD-10-CM

## 2013-11-17 ENCOUNTER — Inpatient Hospital Stay (HOSPITAL_COMMUNITY)
Admission: EM | Admit: 2013-11-17 | Discharge: 2013-11-19 | DRG: 247 | Disposition: A | Discharge status: Home / Self Care | Payer: BC Managed Care – PPO | Attending: Cardiology | Admitting: Cardiology

## 2013-11-17 ENCOUNTER — Emergency Department (HOSPITAL_COMMUNITY): Payer: BC Managed Care – PPO

## 2013-11-17 ENCOUNTER — Encounter (HOSPITAL_COMMUNITY): Payer: Self-pay | Admitting: Emergency Medicine

## 2013-11-17 DIAGNOSIS — Z7982 Long term (current) use of aspirin: Secondary | ICD-10-CM

## 2013-11-17 DIAGNOSIS — G4733 Obstructive sleep apnea (adult) (pediatric): Secondary | ICD-10-CM | POA: Diagnosis present

## 2013-11-17 DIAGNOSIS — M5137 Other intervertebral disc degeneration, lumbosacral region: Secondary | ICD-10-CM | POA: Diagnosis present

## 2013-11-17 DIAGNOSIS — I4729 Other ventricular tachycardia: Secondary | ICD-10-CM | POA: Diagnosis not present

## 2013-11-17 DIAGNOSIS — M51379 Other intervertebral disc degeneration, lumbosacral region without mention of lumbar back pain or lower extremity pain: Secondary | ICD-10-CM | POA: Diagnosis present

## 2013-11-17 DIAGNOSIS — E7800 Pure hypercholesterolemia, unspecified: Secondary | ICD-10-CM | POA: Diagnosis present

## 2013-11-17 DIAGNOSIS — M25512 Pain in left shoulder: Secondary | ICD-10-CM

## 2013-11-17 DIAGNOSIS — I200 Unstable angina: Secondary | ICD-10-CM | POA: Diagnosis present

## 2013-11-17 DIAGNOSIS — R079 Chest pain, unspecified: Secondary | ICD-10-CM | POA: Diagnosis present

## 2013-11-17 DIAGNOSIS — Z8249 Family history of ischemic heart disease and other diseases of the circulatory system: Secondary | ICD-10-CM

## 2013-11-17 DIAGNOSIS — Z9861 Coronary angioplasty status: Secondary | ICD-10-CM | POA: Diagnosis not present

## 2013-11-17 DIAGNOSIS — I252 Old myocardial infarction: Secondary | ICD-10-CM

## 2013-11-17 DIAGNOSIS — F411 Generalized anxiety disorder: Secondary | ICD-10-CM | POA: Diagnosis present

## 2013-11-17 DIAGNOSIS — Z87442 Personal history of urinary calculi: Secondary | ICD-10-CM | POA: Diagnosis not present

## 2013-11-17 DIAGNOSIS — Z7902 Long term (current) use of antithrombotics/antiplatelets: Secondary | ICD-10-CM | POA: Diagnosis not present

## 2013-11-17 DIAGNOSIS — I498 Other specified cardiac arrhythmias: Secondary | ICD-10-CM | POA: Diagnosis not present

## 2013-11-17 DIAGNOSIS — E785 Hyperlipidemia, unspecified: Secondary | ICD-10-CM | POA: Diagnosis present

## 2013-11-17 DIAGNOSIS — I10 Essential (primary) hypertension: Secondary | ICD-10-CM | POA: Diagnosis present

## 2013-11-17 DIAGNOSIS — I472 Ventricular tachycardia, unspecified: Secondary | ICD-10-CM | POA: Diagnosis not present

## 2013-11-17 DIAGNOSIS — E876 Hypokalemia: Secondary | ICD-10-CM | POA: Diagnosis not present

## 2013-11-17 DIAGNOSIS — M25511 Pain in right shoulder: Secondary | ICD-10-CM

## 2013-11-17 DIAGNOSIS — F329 Major depressive disorder, single episode, unspecified: Secondary | ICD-10-CM | POA: Diagnosis present

## 2013-11-17 DIAGNOSIS — I2510 Atherosclerotic heart disease of native coronary artery without angina pectoris: Secondary | ICD-10-CM | POA: Diagnosis present

## 2013-11-17 DIAGNOSIS — E663 Overweight: Secondary | ICD-10-CM | POA: Diagnosis present

## 2013-11-17 DIAGNOSIS — Z79899 Other long term (current) drug therapy: Secondary | ICD-10-CM

## 2013-11-17 DIAGNOSIS — F3289 Other specified depressive episodes: Secondary | ICD-10-CM | POA: Diagnosis present

## 2013-11-17 DIAGNOSIS — I2582 Chronic total occlusion of coronary artery: Secondary | ICD-10-CM | POA: Diagnosis present

## 2013-11-17 DIAGNOSIS — Z955 Presence of coronary angioplasty implant and graft: Secondary | ICD-10-CM

## 2013-11-17 HISTORY — DX: Other chronic pain: G89.29

## 2013-11-17 HISTORY — DX: Calculus of kidney: N20.0

## 2013-11-17 HISTORY — DX: Low back pain, unspecified: M54.50

## 2013-11-17 HISTORY — DX: Other intervertebral disc degeneration, lumbar region without mention of lumbar back pain or lower extremity pain: M51.369

## 2013-11-17 HISTORY — DX: Anxiety disorder, unspecified: F41.9

## 2013-11-17 HISTORY — DX: Low back pain: M54.5

## 2013-11-17 HISTORY — DX: Cardiac arrhythmia, unspecified: I49.9

## 2013-11-17 HISTORY — DX: Depression, unspecified: F32.A

## 2013-11-17 HISTORY — DX: Major depressive disorder, single episode, unspecified: F32.9

## 2013-11-17 HISTORY — DX: Acute myocardial infarction, unspecified: I21.9

## 2013-11-17 HISTORY — DX: Other intervertebral disc degeneration, lumbar region: M51.36

## 2013-11-17 LAB — COMPREHENSIVE METABOLIC PANEL
ALBUMIN: 4.1 g/dL (ref 3.5–5.2)
ALT: 25 U/L (ref 0–53)
ANION GAP: 12 (ref 5–15)
AST: 26 U/L (ref 0–37)
Alkaline Phosphatase: 78 U/L (ref 39–117)
BUN: 13 mg/dL (ref 6–23)
CALCIUM: 9.4 mg/dL (ref 8.4–10.5)
CO2: 33 mEq/L — ABNORMAL HIGH (ref 19–32)
CREATININE: 0.96 mg/dL (ref 0.50–1.35)
Chloride: 100 mEq/L (ref 96–112)
GFR calc Af Amer: >90 mL/min (ref >90)
GFR calc non Af Amer: 86 mL/min — ABNORMAL LOW (ref >90)
Glucose, Bld: 132 mg/dL — ABNORMAL HIGH (ref 70–99)
POTASSIUM: 3.1 meq/L — AB (ref 3.7–5.3)
Sodium: 145 mEq/L (ref 137–147)
Total Bilirubin: 0.7 mg/dL (ref 0.3–1.2)
Total Protein: 7.6 g/dL (ref 6.0–8.3)

## 2013-11-17 LAB — HEPATIC FUNCTION PANEL
ALBUMIN: 4.5 g/dL (ref 3.5–5.2)
ALT: 28 U/L (ref 0–53)
AST: 34 U/L (ref 0–37)
Alkaline Phosphatase: 84 U/L (ref 39–117)
Bilirubin, Direct: <0.2 mg/dL (ref 0.0–0.3)
TOTAL PROTEIN: 7.7 g/dL (ref 6.0–8.3)
Total Bilirubin: 0.7 mg/dL (ref 0.3–1.2)

## 2013-11-17 LAB — BASIC METABOLIC PANEL
ANION GAP: 13 (ref 5–15)
BUN: 10 mg/dL (ref 6–23)
CALCIUM: 9.7 mg/dL (ref 8.4–10.5)
CHLORIDE: 96 meq/L (ref 96–112)
CO2: 30 mEq/L (ref 19–32)
CREATININE: 0.92 mg/dL (ref 0.50–1.35)
GFR calc non Af Amer: 88 mL/min — ABNORMAL LOW (ref >90)
Glucose, Bld: 91 mg/dL (ref 70–99)
Potassium: 3.7 mEq/L (ref 3.7–5.3)
SODIUM: 139 meq/L (ref 137–147)

## 2013-11-17 LAB — CBC WITH DIFFERENTIAL/PLATELET
BASOS ABS: 0 10*3/uL (ref 0.0–0.1)
BASOS PCT: 0 % (ref 0–1)
Basophils Absolute: 0 10*3/uL (ref 0.0–0.1)
Basophils Relative: 0 % (ref 0–1)
Eosinophils Absolute: 0.3 10*3/uL (ref 0.0–0.7)
Eosinophils Absolute: 0.2 10*3/uL (ref 0.0–0.7)
Eosinophils Relative: 3 % (ref 0–5)
Eosinophils Relative: 2 % (ref 0–5)
HCT: 46.1 % (ref 39.0–52.0)
HCT: 44.2 % (ref 39.0–52.0)
HEMOGLOBIN: 16.8 g/dL (ref 13.0–17.0)
Hemoglobin: 15.9 g/dL (ref 13.0–17.0)
LYMPHS PCT: 20 % (ref 12–46)
LYMPHS PCT: 22 % (ref 12–46)
Lymphs Abs: 1.6 10*3/uL (ref 0.7–4.0)
Lymphs Abs: 1.8 10*3/uL (ref 0.7–4.0)
MCH: 32.7 pg (ref 26.0–34.0)
MCH: 32.4 pg (ref 26.0–34.0)
MCHC: 36.4 g/dL — AB (ref 30.0–36.0)
MCHC: 36 g/dL (ref 30.0–36.0)
MCV: 89.9 fL (ref 78.0–100.0)
MCV: 90.2 fL (ref 78.0–100.0)
Monocytes Absolute: 0.6 10*3/uL (ref 0.1–1.0)
Monocytes Absolute: 0.5 10*3/uL (ref 0.1–1.0)
Monocytes Relative: 8 % (ref 3–12)
Monocytes Relative: 6 % (ref 3–12)
NEUTROS ABS: 5.5 10*3/uL (ref 1.7–7.7)
NEUTROS ABS: 5.8 10*3/uL (ref 1.7–7.7)
NEUTROS PCT: 69 % (ref 43–77)
Neutrophils Relative %: 70 % (ref 43–77)
PLATELETS: 128 10*3/uL — AB (ref 150–400)
Platelets: 130 10*3/uL — ABNORMAL LOW (ref 150–400)
RBC: 5.13 MIL/uL (ref 4.22–5.81)
RBC: 4.9 MIL/uL (ref 4.22–5.81)
RDW: 13.2 % (ref 11.5–15.5)
RDW: 13.4 % (ref 11.5–15.5)
WBC: 8 10*3/uL (ref 4.0–10.5)
WBC: 8.4 10*3/uL (ref 4.0–10.5)

## 2013-11-17 LAB — I-STAT TROPONIN, ED: TROPONIN I, POC: 0 ng/mL (ref 0.00–0.08)

## 2013-11-17 LAB — PLATELET INHIBITION P2Y12: PLATELET FUNCTION P2Y12: 154 [PRU] — AB (ref 194–418)

## 2013-11-17 LAB — TROPONIN I: Troponin I: <0.3 ng/mL (ref <0.30)

## 2013-11-17 LAB — LIPASE, BLOOD: LIPASE: 39 U/L (ref 11–59)

## 2013-11-17 LAB — PROTIME-INR
INR: 1.17 (ref 0.00–1.49)
PROTHROMBIN TIME: 14.9 s (ref 11.6–15.2)

## 2013-11-17 LAB — APTT: APTT: 30 s (ref 24–37)

## 2013-11-17 LAB — TSH: TSH: 1.52 u[IU]/mL (ref 0.350–4.500)

## 2013-11-17 MED ORDER — ONDANSETRON HCL 4 MG/2ML IJ SOLN: 4.0000 mg | Freq: Four times a day (QID) | INTRAMUSCULAR | Status: DC | PRN

## 2013-11-17 MED ORDER — CITALOPRAM HYDROBROMIDE 20 MG PO TABS
20.0000 mg | ORAL_TABLET | Freq: Every day | ORAL | Status: DC
  Administered 2013-11-17 – 2013-11-19 (×3): 20 mg via ORAL
  Filled 2013-11-17 (×3): qty 1

## 2013-11-17 MED ORDER — ASPIRIN 81 MG PO CHEW
81.0000 mg | CHEWABLE_TABLET | ORAL | Status: AC
  Administered 2013-11-18: 05:00:00 81 mg via ORAL
  Filled 2013-11-17: qty 1

## 2013-11-17 MED ORDER — ATORVASTATIN CALCIUM 80 MG PO TABS
80.0000 mg | ORAL_TABLET | Freq: Every day | ORAL | Status: DC
  Administered 2013-11-17 – 2013-11-18 (×2): 80 mg via ORAL
  Filled 2013-11-17 (×3): qty 1

## 2013-11-17 MED ORDER — SODIUM CHLORIDE 0.9 % IJ SOLN
3.0000 mL | Freq: Two times a day (BID) | INTRAMUSCULAR | Status: DC
  Administered 2013-11-17: 3 mL via INTRAVENOUS

## 2013-11-17 MED ORDER — ACETAMINOPHEN 325 MG PO TABS: 650.0000 mg | ORAL_TABLET | ORAL | Status: DC | PRN

## 2013-11-17 MED ORDER — SODIUM CHLORIDE 0.9 % IV SOLN: 250.0000 mL | INTRAVENOUS | Status: DC | PRN

## 2013-11-17 MED ORDER — HYDROCHLOROTHIAZIDE 25 MG PO TABS
25.0000 mg | ORAL_TABLET | Freq: Every day | ORAL | Status: DC
  Administered 2013-11-17 – 2013-11-19 (×3): 25 mg via ORAL
  Filled 2013-11-17 (×4): qty 1

## 2013-11-17 MED ORDER — NITROGLYCERIN IN D5W 200-5 MCG/ML-% IV SOLN
3.0000 ug/min | INTRAVENOUS | Status: DC
  Administered 2013-11-17: 3 ug/min via INTRAVENOUS
  Administered 2013-11-18: 20 ug/min via INTRAVENOUS
  Administered 2013-11-18: 25 ug/min via INTRAVENOUS
  Filled 2013-11-17: qty 250

## 2013-11-17 MED ORDER — NITROGLYCERIN 0.4 MG SL SUBL: 0.4000 mg | SUBLINGUAL_TABLET | SUBLINGUAL | Status: DC | PRN

## 2013-11-17 MED ORDER — MODAFINIL 100 MG PO TABS: 100.0000 mg | ORAL_TABLET | Freq: Every day | ORAL | Status: DC | PRN

## 2013-11-17 MED ORDER — LOSARTAN POTASSIUM-HCTZ 100-25 MG PO TABS: 1.0000 | ORAL_TABLET | Freq: Every day | ORAL | Status: DC

## 2013-11-17 MED ORDER — HEPARIN BOLUS VIA INFUSION
3500.0000 [IU] | Freq: Once | INTRAVENOUS | Status: AC
  Administered 2013-11-17: 22:00:00 3500 [IU] via INTRAVENOUS
  Filled 2013-11-17: qty 3500

## 2013-11-17 MED ORDER — HYDROCODONE-ACETAMINOPHEN 5-325 MG PO TABS
1.0000 | ORAL_TABLET | Freq: Four times a day (QID) | ORAL | Status: DC | PRN
Start: 2013-11-17 — End: 2013-11-19
  Administered 2013-11-17 – 2013-11-18 (×5): 1 via ORAL
  Filled 2013-11-17 (×5): qty 1

## 2013-11-17 MED ORDER — HEPARIN (PORCINE) IN NACL 100-0.45 UNIT/ML-% IJ SOLN
1150.0000 [IU]/h | INTRAMUSCULAR | Status: DC
  Administered 2013-11-17: 22:00:00 950 [IU]/h via INTRAVENOUS
  Filled 2013-11-17 (×2): qty 250

## 2013-11-17 MED ORDER — SODIUM CHLORIDE 0.9 % IJ SOLN: 3.0000 mL | INTRAMUSCULAR | Status: DC | PRN

## 2013-11-17 MED ORDER — TICAGRELOR 90 MG PO TABS
90.0000 mg | ORAL_TABLET | Freq: Two times a day (BID) | ORAL | Status: DC
  Administered 2013-11-17 – 2013-11-19 (×4): 90 mg via ORAL
  Filled 2013-11-17 (×5): qty 1

## 2013-11-17 MED ORDER — ASPIRIN EC 81 MG PO TBEC: 81.0000 mg | DELAYED_RELEASE_TABLET | Freq: Every day | ORAL | Status: DC

## 2013-11-17 MED ORDER — LOSARTAN POTASSIUM 50 MG PO TABS
100.0000 mg | ORAL_TABLET | Freq: Every day | ORAL | Status: DC
  Administered 2013-11-17 – 2013-11-19 (×3): 100 mg via ORAL
  Filled 2013-11-17 (×4): qty 2

## 2013-11-17 MED ORDER — SODIUM CHLORIDE 0.9 % IV SOLN
INTRAVENOUS | Status: DC
  Administered 2013-11-18: 07:00:00 via INTRAVENOUS

## 2013-11-17 MED ORDER — ASPIRIN EC 81 MG PO TBEC
81.0000 mg | DELAYED_RELEASE_TABLET | Freq: Every day | ORAL | Status: DC
  Administered 2013-11-18 – 2013-11-19 (×2): 81 mg via ORAL
  Filled 2013-11-17 (×2): qty 1

## 2013-11-17 MED ORDER — ZOLPIDEM TARTRATE 5 MG PO TABS
10.0000 mg | ORAL_TABLET | Freq: Every day | ORAL | Status: DC
  Administered 2013-11-17 – 2013-11-18 (×2): 10 mg via ORAL
  Filled 2013-11-17 (×3): qty 2

## 2013-11-17 MED ORDER — ALPRAZOLAM 0.5 MG PO TABS: 0.5000 mg | ORAL_TABLET | Freq: Two times a day (BID) | ORAL | Status: DC | PRN

## 2013-11-17 NOTE — H&P (Addendum)
Admit date: 11/17/2013 Referring Physician Dr. Jodi Mourning Primary Cardiologist Dr. Donnie Aho Chief complaint/reason for admission:Chest Pain  HPI: This is a 63yo WM with a history of remote MI with PCI of left circ in 2013, HTN, dyslipidemia who presented to the ER with complaints of CP.  He was in his USOH until 24 to 36 hours ago when he developed CP while sitting watching TV. His CP was very similar to the pain he had with his MI 3 years ago.  He states that discomfort was a pressure sensation in his chest with radiation into his arms and shoulders and then last PM he developed severe diaphoresis.  He also felt nauseated as well.  The symptoms have been waxing and waning since then and never have gone completely away.  He did not sleep at all last night.  He took 4 baby ASA and 2 SL NTG with some improvement so he decided to proceed to the ER. He currently is pain free but his arms still ache at a 6/10.      PMH:    Past Medical History  Diagnosis Date  . Hypertension   . Sleep apnea   . High cholesterol   . OSA on CPAP 06/03/2011  . MI (myocardial infarction)   . CAD (coronary artery disease), native coronary artery 06/04/2011    s/p MI with PCI to left circ    PSH:    Past Surgical History  Procedure Laterality Date  . Stent  05/2011  . Cardiac catheterization    . Coronary angioplasty    . Kidney stone removal      ALLERGIES:   Review of patient's allergies indicates no known allergies.  Prior to Admit Meds:   (Not in a hospital admission) Family HX:    Family History  Problem Relation Age of Onset  . Coronary artery disease Father 1  . Coronary artery disease Brother 42   Social HX:    History   Social History  . Marital Status: Married    Spouse Name: N/A    Number of Children: N/A  . Years of Education: N/A   Occupational History  . Not on file.   Social History Main Topics  . Smoking status: Never Smoker   . Smokeless tobacco: Never Used  . Alcohol Use: Yes   Comment: occasionally socially  . Drug Use: No  . Sexual Activity: Not on file   Other Topics Concern  . Not on file   Social History Narrative  . No narrative on file     ROS:  All 11 ROS were addressed and are negative except what is stated in the HPI  PHYSICAL EXAM Filed Vitals:   11/17/13 1532  BP: 144/77  Pulse: 56  Temp: 98.3 F (36.8 C)  Resp: 22   General: Well developed, well nourished, in no acute distress Head: Eyes PERRLA, No xanthomas.   Normal cephalic and atramatic  Lungs:   Clear bilaterally to auscultation and percussion. Heart:   HRRR S1 S2 Pulses are 2+ & equal.            No carotid bruit. No JVD.  No abdominal bruits. No femoral bruits. Abdomen: Bowel sounds are positive, abdomen soft and non-tender without masses Extremities:   No clubbing, cyanosis or edema.  DP +1 Neuro: Alert and oriented X 3. Psych:  Good affect, responds appropriately   Labs:   Lab Results  Component Value Date   WBC 8.0 11/17/2013   HGB 16.8  11/17/2013   HCT 46.1 11/17/2013   MCV 89.9 11/17/2013   PLT 130* 11/17/2013    Recent Labs Lab 11/17/13 1536  NA 139  K 3.7  CL 96  CO2 30  BUN 10  CREATININE 0.92  CALCIUM 9.7  PROT 7.7  BILITOT 0.7  ALKPHOS 84  ALT 28  AST 34  GLUCOSE 91   Lab Results  Component Value Date   CKTOTAL 561* 06/04/2011   CKMB 22.7* 06/04/2011   TROPONINI <0.30 11/17/2013   No results found for this basename: PTT   Lab Results  Component Value Date   INR 1.22 06/04/2011     Lab Results  Component Value Date   CHOL 133 06/04/2011   Lab Results  Component Value Date   HDL 35* 06/04/2011   Lab Results  Component Value Date   LDLCALC 69 06/04/2011   Lab Results  Component Value Date   TRIG 147 06/04/2011   Lab Results  Component Value Date   CHOLHDL 3.8 06/04/2011   No results found for this basename: LDLDIRECT      Radiology:  No results found.  EKG:  NSR  ASSESSMENT:  1.  Unstable angina now with no further CP but has  residual arm pain.  EKG is nonischemic.  Initial troponin is normal.  2.  ASCAD with remote MI in 2013 with DES to the left circumflex 3.  HTN well controlled 4.  Dyslipidemia  PLAN:   1.  Admit to tele bed 2.  Cycle cardiac enzymes 3.  NPO after MN 4.  Cath in am to evaluate coronary anatomy 5.  Continue current meds including ASA/statin/ARB/Brilinta 6.  No BB secondary to borderline bradycardia 7. Check FLP in am 8.  IV Heparin gtt and IV NTG gtt   Quintella ReichertURNER,TRACI R, MD  11/17/2013  6:34 PM

## 2013-11-17 NOTE — Progress Notes (Addendum)
Pt states left arm discomfort returning, rates 1/10, NTG increased to 10 mcg/min, BP cuff moved to rt arm, Heparin gtt started w/ bolus, pt states pain gone within 15 minutes.  BP 134/77, SB 57.

## 2013-11-17 NOTE — Progress Notes (Signed)
Admitted into room 6C05, states chest/arm pain gone.  On NTG 5 mcg/min = 1.5 cc/hr.  BP 156/95, Tele SR 60.  Ate dinner without difficulty.  Oriented to room and plan of care, bedrest, NPO after midnight for cath in AM, call light in reach.

## 2013-11-17 NOTE — ED Provider Notes (Signed)
CSN: [id number]     Arrival date & time 11/17/13  1521 History   First MD Initiated Contact with Patient 11/17/13 1527     No chief complaint on file.    (Consider location/radiation/quality/duration/timing/severity/associated sxs/prior Treatment) HPI  Terry Cook is a 64 y.o. male  with a past medical history of hypertension, sleep apnea, high cholesterol, and coronary artery disease, presenting for chest pain. Patient endorses multiple features of chest pain over the last 2-3 days. He states he's been having bilateral arm pain sometimes worse with movement. Patient also states he's had tightness in his chest, which wakes him up at night, and is somewhat reminiscent of his last MI pain. Pain associated with nausea and diaphoresis. Patient had symptomatic bradycardia in route with EMS. Pain is improved with nitroglycerin. Midline chest pain not reproducible, however patient has reproducible bilateral arm pain with movement. Patient denies shortness of breath. No abdominal pain. No vomiting, diarrhea, cough, fevers, or chills.     Past Medical History  Diagnosis Date  . Hypertension   . High cholesterol   . CAD (coronary artery disease), native coronary artery 06/04/2011    s/p MI with PCI to left circ  . Irregular heart beats   . MI (myocardial infarction) 05/2011  . OSA on CPAP   . Chronic lower back pain   . DDD (degenerative disc disease), lumbar   . Depression   . Anxiety   . Kidney stones ~ 1975   Past Surgical History  Procedure Laterality Date  . Cystoscopy w/ stone manipulation  ~ 1975 X 2  . Coronary angioplasty with stent placement  05/2011    "1"  . Cyst excision Right ~ 2009    "index finger; in office"   Family History  Problem Relation Age of Onset  . Coronary artery disease Father 35  . Coronary artery disease Brother 32   History  Substance Use Topics  . Smoking status: Never Smoker   . Smokeless tobacco: Never Used  . Alcohol Use: 1.2 oz/week    2  Glasses of wine per week    Review of Systems  Constitutional: Positive for diaphoresis.  HENT: Negative.   Eyes: Negative.   Respiratory: Negative.   Cardiovascular: Positive for chest pain.  Gastrointestinal: Positive for nausea.  Endocrine: Negative.   Genitourinary: Negative.   Musculoskeletal: Positive for arthralgias.  Skin: Negative.   Allergic/Immunologic: Negative.   Neurological: Negative.   Hematological: Negative.   Psychiatric/Behavioral: Negative.       Allergies  Review of patient's allergies indicates no known allergies.  Home Medications   Prior to Admission medications   Medication Sig Start Date End Date Taking? Authorizing Provider  ALPRAZolam Prudy Feeler) 0.5 MG tablet Take 0.5 mg by mouth 2 (two) times daily as needed for anxiety.   Yes Historical Provider, MD  aspirin EC 81 MG tablet Take 81 mg by mouth daily.   Yes Historical Provider, MD  atorvastatin (LIPITOR) 80 MG tablet Take 1 tablet (80 mg total) by mouth daily at 6 PM. 06/06/11 11/17/13 Yes Othella Boyer, MD  citalopram (CELEXA) 20 MG tablet Take 20 mg by mouth daily.   Yes Historical Provider, MD  HYDROcodone-acetaminophen (NORCO/VICODIN) 5-325 MG per tablet Take 1 tablet by mouth every 6 (six) hours as needed for moderate pain or severe pain.   Yes Historical Provider, MD  losartan-hydrochlorothiazide (HYZAAR) 100-25 MG per tablet Take 1 tablet by mouth daily.   Yes Historical Provider, MD  modafinil (PROVIGIL) 100  MG tablet Take 100 mg by mouth daily as needed (for off shift work).   Yes Historical Provider, MD  nitroGLYCERIN (NITROSTAT) 0.4 MG SL tablet Place 1 tablet (0.4 mg total) under the tongue every 5 (five) minutes x 3 doses as needed for chest pain. 06/06/11 11/17/13 Yes Othella BoyerWilliam S Tilley, MD  Pseudoephedrine HCl (SUDAFED PO) Take 2 tablets by mouth daily as needed (for sinuses).   Yes Historical Provider, MD  sildenafil (VIAGRA) 50 MG tablet Take 50 mg by mouth daily as needed for erectile  dysfunction.   Yes Historical Provider, MD  zolpidem (AMBIEN) 10 MG tablet Take 10 mg by mouth at bedtime.   Yes Historical Provider, MD   BP 144/77  Pulse 56  Temp(Src) 98.3 F (36.8 C) (Oral)  Resp 22  SpO2 96% Physical Exam  Nursing note and vitals reviewed. Constitutional: He is oriented to person, place, and time. He appears well-developed and well-nourished. No distress.  HENT:  Head: Normocephalic and atraumatic.  Right Ear: External ear normal.  Left Ear: External ear normal.  Nose: Nose normal.  Mouth/Throat: Oropharynx is clear and moist. No oropharyngeal exudate.  Eyes: Conjunctivae and EOM are normal. Pupils are equal, round, and reactive to light. Right eye exhibits no discharge. Left eye exhibits no discharge. No scleral icterus.  Neck: Normal range of motion. Neck supple. No JVD present. No tracheal deviation present. No thyromegaly present.  Cardiovascular: Regular rhythm, normal heart sounds and intact distal pulses.  Bradycardia present.  Exam reveals no gallop and no friction rub.   No murmur heard. Pulmonary/Chest: Effort normal and breath sounds normal. No stridor. No respiratory distress. He has no wheezes. He has no rales. He exhibits no tenderness.  Abdominal: Soft. He exhibits no distension. There is no tenderness. There is no rebound and no guarding.  Musculoskeletal: He exhibits no edema.       Right shoulder: He exhibits decreased range of motion, tenderness, pain and decreased strength.       Left shoulder: He exhibits decreased range of motion, tenderness, pain and decreased strength.  Pain with manipulation of rotator cuff muscles. Especially supraspinatus on the right.  Lymphadenopathy:    He has no cervical adenopathy.  Neurological: He is alert and oriented to person, place, and time. He has normal reflexes. He displays no atrophy and no tremor. No cranial nerve deficit or sensory deficit. He exhibits normal muscle tone. He displays no seizure activity.  Coordination normal. GCS eye subscore is 4. GCS verbal subscore is 5. GCS motor subscore is 6.  Skin: Skin is warm and dry. No rash noted. He is not diaphoretic. No erythema. No pallor.  Psychiatric: He has a normal mood and affect. His behavior is normal. Judgment and thought content normal.    ED Course  Procedures (including critical care time) Labs Review Labs Reviewed  BASIC METABOLIC PANEL - Abnormal; Notable for the following:    GFR calc non Af Amer 88 (*)    All other components within normal limits  CBC WITH DIFFERENTIAL - Abnormal; Notable for the following:    MCHC 36.4 (*)    Platelets 130 (*)    All other components within normal limits  COMPREHENSIVE METABOLIC PANEL - Abnormal; Notable for the following:    Potassium 3.1 (*)    CO2 33 (*)    Glucose, Bld 132 (*)    GFR calc non Af Amer 86 (*)    All other components within normal limits  CBC WITH DIFFERENTIAL -  Abnormal; Notable for the following:    Platelets 128 (*)    All other components within normal limits  BASIC METABOLIC PANEL - Abnormal; Notable for the following:    Potassium 3.4 (*)    CO2 34 (*)    GFR calc non Af Amer 86 (*)    All other components within normal limits  PLATELET INHIBITION P2Y12 - Abnormal; Notable for the following:    Platelet Function  P2Y12 154 (*)    All other components within normal limits  HEPARIN LEVEL (UNFRACTIONATED) - Abnormal; Notable for the following:    Heparin Unfractionated 0.22 (*)    All other components within normal limits  LIPID PANEL - Abnormal; Notable for the following:    Triglycerides 190 (*)    HDL 38 (*)    All other components within normal limits  TROPONIN I  HEPATIC FUNCTION PANEL  LIPASE, BLOOD  TROPONIN I  TSH  TROPONIN I  TROPONIN I  PROTIME-INR  APTT  OCCULT BLOOD X 1 CARD TO LAB, STOOL  CBC  HEPARIN LEVEL (UNFRACTIONATED)  TROPONIN I  I-STAT TROPOININ, ED  Rosezena Sensor, ED    Imaging Review Dg Shoulder Right  11/17/2013    CLINICAL DATA:  Bilateral shoulder pain.  EXAM: RIGHT SHOULDER - 2+ VIEW  COMPARISON:  None.  FINDINGS: Osteoarthritic changes within the right [facility] joint with bony overgrowth and joint space narrowing. Glenohumeral joint is maintained. Mild spurring along the greater tuberosity. No acute bony abnormality. Specifically, no fracture, subluxation, or dislocation. Soft tissues are intact.  IMPRESSION: No acute bony abnormality. Mild degenerative changes in the right shoulder.   Electronically Signed   By: Charlett Nose [name]   On: 11/17/2013 16:53   Dg Chest Port 1 View  11/17/2013   CLINICAL DATA:  Mid chest pain.  EXAM: PORTABLE CHEST - 1 VIEW  COMPARISON:  06/03/2011  FINDINGS: Cardiac silhouette is normal in size. Normal mediastinal and hilar contours. Lungs are clear. No pleural effusion or pneumothorax.  Bony thorax is demineralized but intact.  IMPRESSION: No active disease.   Electronically Signed   By: Amie Portland [name]   On: 11/17/2013 16:13   Dg Shoulder Left  11/17/2013   CLINICAL DATA:  Bilateral shoulder pain.  EXAM: LEFT SHOULDER - 2+ VIEW  COMPARISON:  None.  FINDINGS: Mild osteoarthritic changes within the left [facility] joint with joint space narrowing and spurring. Glenohumeral joint is maintained. No acute bony abnormality. Specifically, no fracture, subluxation, or dislocation. Soft tissues are intact.  IMPRESSION: Mild left AC joint osteoarthritis.  No acute findings.   Electronically Signed   By: Charlett Nose [name]   On: 11/17/2013 16:54     EKG Interpretation   Date/Time:  Wednesday November 17 2013 15:27:46 EDT Ventricular Rate:  58 PR Interval:  180 QRS Duration: 112 QT Interval:  433 QTC Calculation: 425 R Axis:   44 Text Interpretation:  Sinus rhythm Borderline intraventricular conduction  delay similar to previous Confirmed by ZAVITZ  MD, JOSHUA ([id number]) on  11/17/2013 3:34:39 PM      MDM   Final diagnoses:  Unstable angina  Bilateral shoulder pain    Some of patient's chest  pain is reproducible, such as his shoulder pain with rotator cuff maneuvers. However he has a high risk history, and also has features of cardiac chest pain. Also had chest pain and symptomatic bradycardia in route with EMS. Will obtain screening workup including troponins and basic labs, chest x-ray, and EKG. Low  concern for PE as patient has not had any leg swelling or other signs of DVT, and no shortness of breath. Pain more likely consistent with musculoskeletal or cardiac origin. On workup no arthritic changes bilaterally in shoulders, possibly accounting for some of patient's reproducible chest pain.  No other significant findings on workup.  Patient will need to be admitted for further evaluation. Patient discussed with cardiology and will be admitted.  Patient care was discussed with my attending, Dr. Jodi Mourning.        Gavin Pound, MD 11/18/13 [phone]

## 2013-11-17 NOTE — Progress Notes (Signed)
[facility] Community Liaison Stacy, ° °Will be sending information on [facility] orange Card program, using the address provided.  °

## 2013-11-17 NOTE — Progress Notes (Signed)
ANTICOAGULATION CONSULT NOTE - Initial Consult  Pharmacy Consult:  Heparin Indication: chest pain/ACS  No Known Allergies  Patient Measurements: Height: 5\' 5"  (165.1 cm) Weight: 190 lb (86.183 kg) IBW/kg (Calculated) : 61.5 Heparin Dosing Weight: 80 kg  Vital Signs: Temp: 98.4 F (36.9 C) (08/12 2006) Temp src: Oral (08/12 2006) BP: 156/95 mmHg (08/12 2006) Pulse Rate: 61 (08/12 2006)  Labs:  Recent Labs  11/17/13 1536 11/17/13 1818  HGB 16.8  --   HCT 46.1  --   PLT 130*  --   CREATININE 0.92  --   TROPONINI <0.30 <0.30    Estimated Creatinine Clearance: 83 ml/min (by C-G formula based on Cr of 0.92).   Medical History: Past Medical History  Diagnosis Date  . Hypertension   . Sleep apnea   . High cholesterol   . OSA on CPAP 06/03/2011  . MI (myocardial infarction)   . CAD (coronary artery disease), native coronary artery 06/04/2011    s/p MI with PCI to left circ      Assessment: [age] YOM with a significant cardiac history to start IV heparin for ACS.  Baseline labs reviewed and aware patient has chronic thrombocytopenia.   Goal of Therapy:  Heparin level 0.3-0.7 units/ml Monitor platelets by anticoagulation protocol: Yes    Plan:  - Heparin 3500 units IV bolus x 1, then - Heparin gtt at 950 units/hr - Check 6 hr HL - Daily HL / CBC     Humaira Sculley D. Laney Potashang, PharmD, BCPS Pager:  [phone] 11/17/2013, 8:12 PM

## 2013-11-17 NOTE — Progress Notes (Signed)
Called ED for report, was told the RN would call back with report.

## 2013-11-17 NOTE — ED Notes (Signed)
PerEMS: Hx cardiac stent feb 2012.  2-3 days, pain in arms, thinking muscle related, worse last night, took 2 nitro (with relief) and 324 asa this am. Was in bigemeny and converted to sinus rhythm en route, pressures maintained, with bigemeny pulse rate decreased to 30 and then the pain begins.  Reports bilateral arm pain with intermittent hand numbness that radiates in his chest,no nv/ no loc.  SpO2 98%ra.  BP: 135/80,  cbg 93 rr16

## 2013-11-18 ENCOUNTER — Encounter (HOSPITAL_COMMUNITY)
Admission: EM | Disposition: A | Discharge status: Home / Self Care | Payer: BC Managed Care – PPO | Source: Home / Self Care | Attending: Cardiology

## 2013-11-18 DIAGNOSIS — I2510 Atherosclerotic heart disease of native coronary artery without angina pectoris: Principal | ICD-10-CM

## 2013-11-18 HISTORY — PX: LEFT HEART CATHETERIZATION WITH CORONARY ANGIOGRAM: SHX5451

## 2013-11-18 HISTORY — PX: PERCUTANEOUS CORONARY STENT INTERVENTION (PCI-S): SHX5485

## 2013-11-18 LAB — BASIC METABOLIC PANEL
ANION GAP: 10 (ref 5–15)
Anion gap: 10 (ref 5–15)
BUN: 16 mg/dL (ref 6–23)
BUN: 16 mg/dL (ref 6–23)
CALCIUM: 9.2 mg/dL (ref 8.4–10.5)
CALCIUM: 9.1 mg/dL (ref 8.4–10.5)
CO2: 34 meq/L — AB (ref 19–32)
CO2: 31 meq/L (ref 19–32)
Chloride: 98 mEq/L (ref 96–112)
Chloride: 103 mEq/L (ref 96–112)
Creatinine, Ser: 0.97 mg/dL (ref 0.50–1.35)
Creatinine, Ser: 0.96 mg/dL (ref 0.50–1.35)
GFR calc Af Amer: >90 mL/min (ref >90)
GFR calc Af Amer: >90 mL/min (ref >90)
GFR calc non Af Amer: 86 mL/min — ABNORMAL LOW (ref >90)
GFR, EST NON AFRICAN AMERICAN: 86 mL/min — AB (ref >90)
GLUCOSE: 94 mg/dL (ref 70–99)
GLUCOSE: 100 mg/dL — AB (ref 70–99)
POTASSIUM: 4.2 meq/L (ref 3.7–5.3)
Potassium: 3.4 mEq/L — ABNORMAL LOW (ref 3.7–5.3)
SODIUM: 142 meq/L (ref 137–147)
SODIUM: 144 meq/L (ref 137–147)

## 2013-11-18 LAB — LIPID PANEL
Cholesterol: 139 mg/dL (ref 0–200)
HDL: 38 mg/dL — AB (ref >39)
LDL CALC: 63 mg/dL (ref 0–99)
Total CHOL/HDL Ratio: 3.7 RATIO
Triglycerides: 190 mg/dL — ABNORMAL HIGH (ref <150)
VLDL: 38 mg/dL (ref 0–40)

## 2013-11-18 LAB — HEPARIN LEVEL (UNFRACTIONATED)
HEPARIN UNFRACTIONATED: 0.22 [IU]/mL — AB (ref 0.30–0.70)
HEPARIN UNFRACTIONATED: 0.49 [IU]/mL (ref 0.30–0.70)

## 2013-11-18 LAB — POCT ACTIVATED CLOTTING TIME: Activated Clotting Time: 630 seconds

## 2013-11-18 LAB — TROPONIN I: Troponin I: <0.3 ng/mL (ref <0.30)

## 2013-11-18 SURGERY — LEFT HEART CATHETERIZATION WITH CORONARY ANGIOGRAM
Anesthesia: LOCAL

## 2013-11-18 MED ORDER — MIDAZOLAM HCL 2 MG/2ML IJ SOLN
INTRAMUSCULAR | Status: AC
  Filled 2013-11-18: qty 2

## 2013-11-18 MED ORDER — LIDOCAINE HCL (PF) 1 % IJ SOLN
INTRAMUSCULAR | Status: AC
  Filled 2013-11-18: qty 30

## 2013-11-18 MED ORDER — SODIUM CHLORIDE 0.9 % IJ SOLN: 3.0000 mL | Freq: Two times a day (BID) | INTRAMUSCULAR | Status: DC

## 2013-11-18 MED ORDER — SODIUM CHLORIDE 0.9 % IV SOLN: 250.0000 mL | INTRAVENOUS | Status: DC | PRN

## 2013-11-18 MED ORDER — SODIUM CHLORIDE 0.9 % IV SOLN: INTRAVENOUS | Status: AC

## 2013-11-18 MED ORDER — MIDAZOLAM HCL 2 MG/2ML IJ SOLN
INTRAMUSCULAR | Status: AC
Start: 2013-11-18 — End: 2013-11-18
  Filled 2013-11-18: qty 2

## 2013-11-18 MED ORDER — TICAGRELOR 90 MG PO TABS
ORAL_TABLET | ORAL | Status: AC
  Filled 2013-11-18: qty 2

## 2013-11-18 MED ORDER — SODIUM CHLORIDE 0.9 % IJ SOLN: 3.0000 mL | INTRAMUSCULAR | Status: DC | PRN

## 2013-11-18 MED ORDER — POTASSIUM CHLORIDE CRYS ER 20 MEQ PO TBCR
40.0000 meq | EXTENDED_RELEASE_TABLET | ORAL | Status: AC
  Administered 2013-11-18 (×2): 40 meq via ORAL
  Filled 2013-11-18 (×2): qty 2

## 2013-11-18 MED ORDER — NITROGLYCERIN 1 MG/10 ML FOR IR/CATH LAB
INTRA_ARTERIAL | Status: AC
  Filled 2013-11-18: qty 10

## 2013-11-18 MED ORDER — SODIUM CHLORIDE 0.9 % IV SOLN: 1.0000 mL/kg/h | INTRAVENOUS | Status: DC

## 2013-11-18 MED ORDER — OXYCODONE-ACETAMINOPHEN 5-325 MG PO TABS: 1.0000 | ORAL_TABLET | ORAL | Status: DC | PRN

## 2013-11-18 MED ORDER — ALUM & MAG HYDROXIDE-SIMETH 200-200-20 MG/5ML PO SUSP
30.0000 mL | ORAL | Status: DC | PRN
  Administered 2013-11-18: 30 mL via ORAL
  Filled 2013-11-18: qty 30

## 2013-11-18 MED ORDER — BIVALIRUDIN 250 MG IV SOLR
INTRAVENOUS | Status: AC
  Filled 2013-11-18: qty 250

## 2013-11-18 MED ORDER — FENTANYL CITRATE 0.05 MG/ML IJ SOLN
INTRAMUSCULAR | Status: AC
  Filled 2013-11-18: qty 2

## 2013-11-18 MED ORDER — HEPARIN (PORCINE) IN NACL 2-0.9 UNIT/ML-% IJ SOLN
INTRAMUSCULAR | Status: AC
  Filled 2013-11-18: qty 1000

## 2013-11-18 MED ORDER — HEPARIN BOLUS VIA INFUSION
1500.0000 [IU] | Freq: Once | INTRAVENOUS | Status: AC
  Administered 2013-11-18: 1500 [IU] via INTRAVENOUS
  Filled 2013-11-18: qty 1500

## 2013-11-18 MED ORDER — ASPIRIN 81 MG PO CHEW: 81.0000 mg | CHEWABLE_TABLET | ORAL | Status: DC

## 2013-11-18 MED ORDER — HEPARIN (PORCINE) IN NACL 2-0.9 UNIT/ML-% IJ SOLN
INTRAMUSCULAR | Status: AC
  Filled 2013-11-18: qty 500

## 2013-11-18 NOTE — Progress Notes (Signed)
ANTICOAGULATION CONSULT NOTE - Follow Up Consult  Pharmacy Consult for heparin Indication: chest pain/ACS  No Known Allergies  Patient Measurements: Height: 5\' 5"  (165.1 cm) Weight: 182 lb 8.7 oz (82.8 kg) IBW/kg (Calculated) : 61.5 Heparin Dosing Weight:   Vital Signs: Temp: 97.9 F (36.6 C) (08/13 0711) Temp src: Oral (08/13 0711) BP: 138/71 mmHg (08/13 0711) Pulse Rate: 61 (08/13 0711)  Labs:  Recent Labs  11/17/13 1536  11/17/13 2051 11/18/13 0130 11/18/13 0246 11/18/13 1029  HGB 16.8  --  15.9  --   --   --   HCT 46.1  --  44.2  --   --   --   PLT 130*  --  128*  --   --   --   APTT  --   --  30  --   --   --   LABPROT  --   --  14.9  --   --   --   INR  --   --  1.17  --   --   --   HEPARINUNFRC  --   --   --   --  0.22* 0.49  CREATININE 0.92  --  0.96  --  0.97 0.96  TROPONINI <0.30  < > <0.30 <0.30  --  <0.30  < > = values in this interval not displayed.  Estimated Creatinine Clearance: 78 ml/min (by C-G formula based on Cr of 0.96).   Medications:  Scheduled:  . aspirin EC  81 mg Oral Daily  . atorvastatin  80 mg Oral q1800  . citalopram  20 mg Oral Daily  . losartan  100 mg Oral Daily   And  . hydrochlorothiazide  25 mg Oral Daily  . sodium chloride  3 mL Intravenous Q12H  . ticagrelor  90 mg Oral BID  . zolpidem  10 mg Oral QHS   Infusions:  . sodium chloride 50 mL/hr at 11/18/13 0700  . heparin 1,150 Units/hr (11/18/13 0512)  . nitroGLYCERIN 30 mcg/min (11/18/13 0940)    Assessment: 63 yo male with ACS is currently on therapeutic heparin.  Heparin level is 0.49.  Goal of Therapy:  Heparin level 0.3-0.7 units/ml Monitor platelets by anticoagulation protocol: Yes   Plan:  1) Continue heparin at 1150 units/hr 2) f/u after cath  Shonta Phillis, Tsz-Yin 11/18/2013,11:34 AM

## 2013-11-18 NOTE — Progress Notes (Signed)
ANTICOAGULATION CONSULT NOTE Pharmacy Consult:  Heparin Indication: chest pain/ACS  No Known Allergies  Patient Measurements: Height: 5\' 5"  (165.1 cm) Weight: 182 lb 8.7 oz (82.8 kg) IBW/kg (Calculated) : 61.5 Heparin Dosing Weight: 80 kg  Vital Signs: Temp: 97.9 F (36.6 C) (08/13 0000) Temp src: Oral (08/13 0000) BP: 135/90 mmHg (08/13 0300) Pulse Rate: 59 (08/13 0100)  Labs:  Recent Labs  11/17/13 1536 11/17/13 1818 11/17/13 2051 11/18/13 0130 11/18/13 0246  HGB 16.8  --  15.9  --   --   HCT 46.1  --  44.2  --   --   PLT 130*  --  128*  --   --   APTT  --   --  30  --   --   LABPROT  --   --  14.9  --   --   INR  --   --  1.17  --   --   HEPARINUNFRC  --   --   --   --  0.22*  CREATININE 0.92  --  0.96  --  0.97  TROPONINI <0.30 <0.30 <0.30 <0.30  --     Estimated Creatinine Clearance: 77.2 ml/min (by C-G formula based on Cr of 0.97).  Assessment: 63 y.o. male with chest pain for heparin  Goal of Therapy:  Heparin level 0.3-0.7 units/ml Monitor platelets by anticoagulation protocol: Yes   Plan:  Heparin 1500 units IV bolus, then increase heparin 1150 units/hr Check heparin level in 6 hours.   Geannie RisenGreg Domini Vandehei, PharmD, BCPS

## 2013-11-18 NOTE — Progress Notes (Signed)
TR BAND REMOVAL  LOCATION:    right radial  DEFLATED PER PROTOCOL:    Yes.    TIME BAND OFF / DRESSING APPLIED:    1900   SITE UPON ARRIVAL:    Level 0  SITE AFTER BAND REMOVAL:    Level 0  REVERSE ALLEN'S TEST:     positive  CIRCULATION SENSATION AND MOVEMENT:    Within Normal Limits   Yes.    COMMENTS:  Tr band removed. Site level 0.  No bleeding. Pt tol well

## 2013-11-18 NOTE — CV Procedure (Signed)
Left Heart Catheterization with Coronary Angiography Report  Terry Cook  63 y.o.  male [birth year]  Procedure Date: 11/18/2013 Referring Physician: [name], [name] Primary Cardiologist: Same  INDICATIONS: Unstable angina with atypical arm symptoms and negative cardiac markers  PROCEDURE: 1. Left heart catheterization; 2. Coronary angiography; 3. Left ventriculography; 4. Drug-eluting stent implantation  CONSENT:  The risks, benefits, and details of the procedure were explained in detail to the patient. Risks including death, stroke, heart attack, kidney injury, allergy, limb ischemia, bleeding and radiation injury were discussed.  The patient verbalized understanding and wanted to proceed.  Informed written consent was obtained.  PROCEDURE TECHNIQUE:  After Xylocaine anesthesia a a 5 French slender sheath was placed in the right radial artery with an angiocath and the modified Seldinger technique.  Coronary angiography was done using a 5 F JR 4 and JL 3.5 cm diagnostic catheter.  Left ventriculography was done using the JR 4 catheter and hand injection.   After review of the digital images, the mid LAD segment was noted to be diffusely diseased with up to 90% stenosis of the origin of a small diagonal branch. After considering the patient's symptoms, speaking with his primary cardiologist, Dr. Donnie Ahoilley, and discussing with the patient we decided to proceed with PCI of the LAD. He was loaded with Brilinta. IV bivalirudin was started. A CT was documented greater than 300. A pro-water guidewire was then used to cross the stenosis in the LAD. We chose a 6 [place] XB LAD guide catheter. Predilatation was performed with a 2 5 x 15 mm balloon. We then positioned and deployed a 3.0 x 32 mm Promus Premier drug-eluting stent. The stent was deployed at 9 atmospheres. Postdilatation was performed with a 27 x 3.0 mm [place] Euphora x14 atmospheres for 2 inflations. TIMI grade 3 flow was noted. A  proximal and distal stepup was noted. Balloon inflations reproduced chest discomfort similar to that that has occurred over the past 6 months and during admission. There was no arm discomfort during balloon inflations and it is likely that arm discomfort is not ischemia related.  Are angiographic images were reviewed post PCI and the case was terminated.  Hemostasis was achieved with a wristband.  CONTRAST:  Total of 190 cc.  COMPLICATIONS:  None   HEMODYNAMICS:  Aortic pressure 108/56 mmHg; LV pressure 111/0 mmHg; LVEDP 8 mmHg  ANGIOGRAPHIC DATA:   The left main coronary artery is widely patent.  The left anterior descending artery is is a large vessel with a "Twin pattern" that includes a large anterior interventricular groove vessel this supplies all the supple perforators and may be intramyocardial. There is a large vessel that arises proximal and supplies the distal anterior interventricular groove and wraps around the left ventricular apex. This vessel contains a segmental 90% stenosis. This represents significant progression since the prior study..  The left circumflex artery is large, giving origin to 4 moderate sized obtuse marginal branches. The previously placed stent in the midsegment the jail Z. second obtuse marginal is widely patent the.  The right coronary artery is totally occluded in the mid vessel. Right to right and left-to-right collaterals are noted to the distal vessel.  PCI RESULTS: There is severe mid segmental 90% stenosis in the LAD that has the distribution around the left ventricular apex. This was the site of PCI where a 32 x 3.0 Promus Premier DES was placed and post dilated to 14 atmospheres. The region of stenosis was reduced  to 0% with TIMI grade 3 flow. There is mild to moderate diffuse disease proximal to the stent but no high-grade obstruction is identified. A diagonal that arises from the stented region remain patent.  LEFT VENTRICULOGRAM:  Left ventricular  angiogram was done in the 30 RAO projection and revealed moderate inferior wall hypokinesis with an EF of 45%.   IMPRESSIONS:  1. Progressive angina pectoris with episodes occurring at rest over the past several days is felt to be related to the 90% stenosis in the mid LAD. Symptoms were reproduced during intervention.  2. Successful drug-eluting stent implantation reducing the 90% stenosis to 0% with TIMI grade 3 flow  3. Widely patent mid circumflex stent.  4. Totally occluded mid RCA  5. Left ventricular systolic dysfunction with inferior wall hypokinesis and an EF of 45%   RECOMMENDATION:  Aspirin and Brilinta x12 months followed by continuous aspirin and Plavix therapy thereafter. The patient be eligible for discharge in the a.m.

## 2013-11-18 NOTE — Progress Notes (Signed)
Subjective:  Admitted with severe arm pain and bilat wrist numbness. Also vague chest pain.  Did not have arm symptoms with prior event and did not have crushing pain with this event like previously. Currently reasonably comfortable.  Objective:  Vital Signs in the last 24 hours: BP 138/71  Pulse 61  Temp(Src) 97.9 F (36.6 C) (Oral)  Resp 20  Ht 5\' 5"  (1.651 m)  Wt 82.8 kg (182 lb 8.7 oz)  BMI 30.38 kg/m2  SpO2 98%  Physical Exam: Pleasant WM in NAD Lungs:  Clear Cardiac:  Regular rhythm, normal S1 and S2, no S3 Extremities:  No edema present  Intake/Output from previous day: 08/12 0701 - 08/13 0700 In: 623.9 [P.O.:320; I.V.:303.9] Out: 500 [Urine:500]  Weight Filed Weights   11/17/13 1556 11/17/13 2006 11/18/13 0000  Weight: 86.183 kg (190 lb) 82.8 kg (182 lb 8.7 oz) 82.8 kg (182 lb 8.7 oz)    Lab Results: Basic Metabolic Panel:  Recent Labs  69/62/9507/03/23 2051 11/18/13 0246  NA 145 142  K 3.1* 3.4*  CL 100 98  CO2 33* 34*  GLUCOSE 132* 94  BUN 13 16  CREATININE 0.96 0.97   CBC:  Recent Labs  11/17/13 1536 11/17/13 2051  WBC 8.0 8.4  NEUTROABS 5.5 5.8  HGB 16.8 15.9  HCT 46.1 44.2  MCV 89.9 90.2  PLT 130* 128*   Cardiac Enzymes:  Recent Labs  11/17/13 1818 11/17/13 2051 11/18/13 0130  TROPONINI <0.30 <0.30 <0.30    Telemetry: Sinus  Assessment/Plan: 1. Atypical arm pain, doubt this is angina 2. Midsternal chest pain c/w unstable angina 3. CAD  Rec:  With progressive symptoms plan cath today.  He has a known occlusion of the RCA from 2 years ago.  He would prefer radial approach [facility] Cardiac catheterization was discussed with the patient fully including risks of myocardial infarction, death, stroke, bleeding, arrhythmia, dye allergy, renal insufficiency or bleeding.  The patient understands and is willing to proceed. Possiblity of PCI discussed with patient.  Dr. Katrinka BlazingSmith will do procedure.   Terry PalmerW. Spencer  Laiken Cook, [name]  MD [facility]iology  11/18/2013, 9:08 AM

## 2013-11-18 NOTE — H&P (View-Only) (Signed)
Subjective:  Admitted with severe arm pain and bilat wrist numbness. Also vague chest pain.  Did not have arm symptoms with prior event and did not have crushing pain with this event like previously. Currently reasonably comfortable.  Objective:  Vital Signs in the last 24 hours: BP 138/71  Pulse 61  Temp(Src) 97.9 F (36.6 C) (Oral)  Resp 20  Ht 5' 5" (1.651 m)  Wt 82.8 kg (182 lb 8.7 oz)  BMI 30.38 kg/m2  SpO2 98%  Physical Exam: Pleasant WM in NAD Lungs:  Clear Cardiac:  Regular rhythm, normal S1 and S2, no S3 Extremities:  No edema present  Intake/Output from previous day: 08/12 0701 - 08/13 0700 In: 623.9 [P.O.:320; I.V.:303.9] Out: 500 [Urine:500]  Weight Filed Weights   11/17/13 1556 11/17/13 2006 11/18/13 0000  Weight: 86.183 kg (190 lb) 82.8 kg (182 lb 8.7 oz) 82.8 kg (182 lb 8.7 oz)    Lab Results: Basic Metabolic Panel:  Recent Labs  11/17/13 2051 11/18/13 0246  NA 145 142  K 3.1* 3.4*  CL 100 98  CO2 33* 34*  GLUCOSE 132* 94  BUN 13 16  CREATININE 0.96 0.97   CBC:  Recent Labs  11/17/13 1536 11/17/13 2051  WBC 8.0 8.4  NEUTROABS 5.5 5.8  HGB 16.8 15.9  HCT 46.1 44.2  MCV 89.9 90.2  PLT 130* 128*   Cardiac Enzymes:  Recent Labs  11/17/13 1818 11/17/13 2051 11/18/13 0130  TROPONINI <0.30 <0.30 <0.30    Telemetry: Sinus  Assessment/Plan: 1. Atypical arm pain, doubt this is angina 2. Midsternal chest pain c/w unstable angina 3. CAD  Rec:  With progressive symptoms plan cath today.  He has a known occlusion of the RCA from 2 years ago.  He would prefer radial approach so will ask colleagues from [facility] to do. Cardiac catheterization was discussed with the patient fully including risks of myocardial infarction, death, stroke, bleeding, arrhythmia, dye allergy, renal insufficiency or bleeding.  The patient understands and is willing to proceed. Possiblity of PCI discussed with patient.  Dr. Smith will do procedure.   W. Spencer  Tilley, [name]  MD FACC Cardiology  11/18/2013, 9:08 AM    

## 2013-11-18 NOTE — Progress Notes (Signed)
K+ 3.1 per last lab.  Dr. Bufford ButtnerMehta informed, orders received to give KCl PO.  Pt awake, on CPAP, denies complaints, SB 59, had bigeminal PVC's shortly after admission but none now.  BP 147/92.

## 2013-11-18 NOTE — Progress Notes (Signed)
UR completed Anderia Lorenzo K. Lorma Heater, RN, BSN, MSHL, CCM  11/18/2013 10:28 AM

## 2013-11-18 NOTE — Interval H&P Note (Signed)
Cath Lab Visit (complete for each Cath Lab visit)  Clinical Evaluation Leading to the Procedure:   ACS: No.  Non-ACS:    Anginal Classification: CCS II  Anti-ischemic medical therapy: Minimal Therapy (1 class of medications)  Non-Invasive Test Results: No non-invasive testing performed  Prior CABG: No previous CABG      History and Physical Interval Note:  11/18/2013 12:55 PM  Terry Cook  has presented today for surgery, with the diagnosis of cp  The various methods of treatment have been discussed with the patient and family. After consideration of risks, benefits and other options for treatment, the patient has consented to  Procedure(s): LEFT HEART CATHETERIZATION WITH CORONARY ANGIOGRAM (N/A) as a surgical intervention .  The patient's history has been reviewed, patient examined, no change in status, stable for surgery.  I have reviewed the patient's chart and labs.  Questions were answered to the patient's satisfaction.     Lesleigh NoeSMITH III,HENRY W

## 2013-11-18 NOTE — Progress Notes (Signed)
0900 Patient reported left arm pain with numbness in left hand rated 6/10. Increased nitroglycerin infusion rate from 20 to 25 mcg. Rechecked at 0915 and patient sleeping. Patient called at 0945 and reported having pain in left arm again for the last 5 minutes rated 6/10, nitroglycerin increased from 25 mcg to 30 mcg and 12 lead ecg acquired. Patient reported pain radiating to left shoulder and pain starting in the right extremity with numbness in the right hand. Left upper extremity pain resolving and at 950 when 12 lead ecg taken, patient reported pain in left arm resolved and right arm pain 9/10. 0955 pain in right arm resolving and rate 5/10. 1000 Reported pain resolving. Pain resolved at 1005.

## 2013-11-18 NOTE — Care Management Note (Addendum)
  Page 2 of 2   11/19/2013     4:37:05 PM CARE MANAGEMENT NOTE 11/19/2013  Patient:  Terry Cook,Terry Cook   Account Number:  [id number]  Date Initiated:  11/18/2013  Documentation initiated by:  Alfred Eckley  Subjective/Objective Assessment:   CP     Action/Plan:   CM to follow for disposition needs   Anticipated DC Date:  11/19/2013   Anticipated DC Plan:  HOME/SELF CARE         Choice offered to / List presented to:             Status of service:  Completed, signed off Medicare Important Message given?   (If response is "NO", the following Medicare IM given date fields will be blank) Date Medicare IM given:   Medicare IM given by:   Date Additional Medicare IM given:   Additional Medicare IM given by:    Discharge Disposition:  HOME/SELF CARE  Per UR Regulation:  Reviewed for med. necessity/level of care/duration of stay  If discussed at Long Length of Stay Meetings, dates discussed:    Comments:  Darnisha Vernet RN, BSN, MSHL, CCM  Nurse - Case Manager, (Unit [phone])  [phone]  11/19/2013  Social:  From home with wife.  Also has motherinlaw in the home receiving home hospice services. Hx/o has previously used Brilinta 30 day freee card during a time of not being insured.  States was on Brilinata x 1 year at that time. Med Review:  Brilinta planned.  Benefits check sent. Brilinta card explained for commercial insured co-pay asssitance card.  Update: ---11/19/2013 1132 by Sloan LeiterLLIE HAWKINS--- CO-PAY AMOUNT FOR BRILINTA  90MG . TWICE A DAY  IS $50.00 no-auth required,pt. may use any pharmacy. ---11/19/2013 1103 by Donato SchultzRYSTAL Shakela Donati--- Benefits Check: ticagrelor (BRILINTA) tablet 90 mg  :  Dose 90 mg  :  Oral :  2 times daily Coverage, co-pay, deductibles, authorization, pharm requirements. (Patient for d/c today) Thanks, Lashanti Chambless RN, BSN, MSHL, CCM  Nurse - Case Manager, (Unit [phone])  [phone]  11/19/2013   Donato Schultzrystal Dannon Perlow RN, BSN, MSHL, CCM  Nurse -  Case Manager, (Unit [phone])  [phone]  11/18/2013 Cath scheduled for 11/18/2013 Dispositon Plan:  Home / Self Care CM will assess for medication assistance needs post cath.

## 2013-11-19 LAB — CBC
HEMATOCRIT: 41.5 % (ref 39.0–52.0)
HEMOGLOBIN: 14.9 g/dL (ref 13.0–17.0)
MCH: 32.5 pg (ref 26.0–34.0)
MCHC: 35.9 g/dL (ref 30.0–36.0)
MCV: 90.4 fL (ref 78.0–100.0)
Platelets: 114 10*3/uL — ABNORMAL LOW (ref 150–400)
RBC: 4.59 MIL/uL (ref 4.22–5.81)
RDW: 13.5 % (ref 11.5–15.5)
WBC: 9.2 10*3/uL (ref 4.0–10.5)

## 2013-11-19 LAB — BASIC METABOLIC PANEL
Anion gap: 13 (ref 5–15)
BUN: 12 mg/dL (ref 6–23)
CALCIUM: 8.7 mg/dL (ref 8.4–10.5)
CHLORIDE: 100 meq/L (ref 96–112)
CO2: 29 mEq/L (ref 19–32)
CREATININE: 0.98 mg/dL (ref 0.50–1.35)
GFR calc Af Amer: >90 mL/min (ref >90)
GFR calc non Af Amer: 86 mL/min — ABNORMAL LOW (ref >90)
GLUCOSE: 87 mg/dL (ref 70–99)
Potassium: 3.1 mEq/L — ABNORMAL LOW (ref 3.7–5.3)
Sodium: 142 mEq/L (ref 137–147)

## 2013-11-19 MED ORDER — POTASSIUM CHLORIDE CRYS ER 20 MEQ PO TBCR
20.0000 meq | EXTENDED_RELEASE_TABLET | Freq: Every day | ORAL | Status: DC
  Administered 2013-11-19: 20 meq via ORAL
  Filled 2013-11-19: qty 1

## 2013-11-19 MED ORDER — METOPROLOL TARTRATE 25 MG PO TABS: 25.0000 mg | ORAL_TABLET | Freq: Two times a day (BID) | ORAL | Status: DC

## 2013-11-19 MED ORDER — POTASSIUM CHLORIDE CRYS ER 20 MEQ PO TBCR: 20.0000 meq | EXTENDED_RELEASE_TABLET | Freq: Every day | ORAL | Status: DC

## 2013-11-19 MED ORDER — POTASSIUM CHLORIDE 20 MEQ/15ML (10%) PO LIQD
40.0000 meq | Freq: Once | ORAL | Status: AC
  Administered 2013-11-19: 40 meq via ORAL
  Filled 2013-11-19: qty 30

## 2013-11-19 MED ORDER — TICAGRELOR 90 MG PO TABS: 90.0000 mg | ORAL_TABLET | Freq: Every day | ORAL | Status: DC

## 2013-11-19 MED ORDER — METOPROLOL TARTRATE 25 MG PO TABS
25.0000 mg | ORAL_TABLET | Freq: Two times a day (BID) | ORAL | Status: DC
  Administered 2013-11-19: 25 mg via ORAL
  Filled 2013-11-19: qty 1

## 2013-11-19 NOTE — Progress Notes (Signed)
CARDIAC REHAB PHASE I   PRE:  Rate/Rhythm: 75 SR  BP:  Supine: 158/63  Sitting:   Standing:    SaO2:   MODE:  Ambulation: 1000 ft   POST:  Rate/Rhythm: 95 SR  BP:  Supine:   Sitting: 142/101  Standing:    SaO2:  0807-0910 Pt tolerated ambulation well without c/o of co or SOB. BP up after walk. After walk pt in chair and talking with Dr Donnie Ahoilley, HR up to 157 irregular, brief episode. Pt without c/o during episode. Completed stent discharge education with pt. He voices understanding. Pt agrees to Outpt. [facility] [place] will send referral.  Melina CopaLisa Sherese Heyward RN 11/19/2013 9:31 AM

## 2013-11-19 NOTE — Progress Notes (Signed)
Doing well with no angina. Cath site unremarkable. Plan home today per Dr. Donnie Ahoilley. Prefer Brilinta over plavix given length of stent.

## 2013-11-19 NOTE — Discharge Summary (Addendum)
Physician Discharge Summary  Patient ID: Terry Cook L Chenette MRN: [id number] DOB/AGE: 11-18-1950 63 y.o.  Admit date: 11/17/2013 Discharge date: 11/19/2013  Primary Physician:  Dr. Juluis RainierElizabeth Barnes  Primary Discharge Diagnosis:  1. Unstable angina pectoris  Secondary Discharge Diagnosis: 2. Coronary artery disease with stenting of the LAD done this admission 3. Hypertension 4. Sleep apnea 5. Hyperlipidemia under treatment 6. Overweight 7. Hypokalemia  Procedures:  Cardiac catheterization with drug-eluting stent placement of the LAD done through the radial approach by Dr. Alicia AmelSmith  Hospital Course: This [age] male has a history of coronary artery disease. 2 years ago he had a episode of unstable angina was found to have an occluded right coronary artery and a severe mid circumflex stenosis. At that time he had a drug-eluting stent placed and had medical treatment done of the occlusion of the right coronary artery and distal LAD disease. He did not get involved in cardiac rehabilitation but has done well since then. Over the past week he developed some bilateral arm pain which was atypical as well as some mild numbness in his wrists. He became concerned about this and then had an episode of prolonged substernal chest discomfort the night of admission and was brought in to the hospital for further evaluation.  The patient was placed on intravenous heparin. Serial enzymes were negative. EKG did not show any ischemic changes. Because the suggestive nature the sub-sternal chest pain he was taken to the cardiac catheterization laboratory by Dr. Katrinka BlazingSmith. He was found to have a progressive stenosis in the LAD which was a twin branch and the previous stent was widely patent in the circumflex or the right coronary artery was occluded. He had diffuse moderately severe disease involving the first marginal branch that was not amenable to PCI. Dr. Katrinka BlazingSmith placed a 3.0 x 32 mm Promus stent in the LAD that was slightly  oversized for this vessel. The patient tolerated the procedure well and was feeling better following the procedure.  The next morning his radial catheterization site was evaluated and was clean and dry. On talking with the patient prior to discharge she was noted to have a 30 to be run of wide-complex tachycardia at a rate of 150. He was difficult to tell if this was SVT with aberrancy or VT. The patient was asymptomatic during this episode. He was placed on beta blockers and we will observe him for several more hours and if he remains free of the wrist and we will let him go home and pursue his treatment as an outpatient. His ejection fraction is greater than 40%. He'll be discharged in improved condition and we would like him to get involved in cardiac rehabilitation. I will see him in followup in one week. Instructions were given for care of his radial catheterization site. Because of the length of the stent he will be changed from Plavix to Brilanta. He was hophlakemic and was given potassium prior to discharge and sent home on potassium.  Discharge Exam: Blood pressure 158/63, pulse 68, temperature 99.3 F (37.4 C), temperature source Oral, resp. rate 18, height 5\' 5"  (1.651 m), weight 84.8 kg (186 lb 15.2 oz), SpO2 96.00%. Weight: 84.8 kg (186 lb 15.2 oz) Radial catheterization site is clean and dry, lungs clear, no S3  Labs: CBC:   Lab Results  Component Value Date   WBC 9.2 11/19/2013   HGB 14.9 11/19/2013   HCT 41.5 11/19/2013   MCV 90.4 11/19/2013   PLT 114* 11/19/2013    CMP:  Recent Labs Lab 11/17/13 2051  11/19/13 0241  NA 145  < > 142  K 3.1*  < > 3.1*  CL 100  < > 100  CO2 33*  < > 29  BUN 13  < > 12  CREATININE 0.96  < > 0.98  CALCIUM 9.4  < > 8.7  PROT 7.6  --   --   BILITOT 0.7  --   --   ALKPHOS 78  --   --   ALT 25  --   --   AST 26  --   --   GLUCOSE 132*  < > 87  < > = values in this interval not displayed.  Lipid Panel     Component Value Date/Time   CHOL  139 11/18/2013 0246   TRIG 190* 11/18/2013 0246   HDL 38* 11/18/2013 0246   CHOLHDL 3.7 11/18/2013 0246   VLDL 38 11/18/2013 0246   LDLCALC 63 11/18/2013 0246    Cardiac Enzymes:  Recent Labs  11/17/13 2051 11/18/13 0130 11/18/13 1029  TROPONINI <0.30 <0.30 <0.30   Thyroid: Lab Results  Component Value Date   TSH 1.520 11/17/2013    Radiology: Lungs clear, heart size normal, no acute changes  EKG: Sinus rhythm, no acute disease.  Discharge Medications:   Medication List    STOP taking these medications       SUDAFED PO      TAKE these medications       ALPRAZolam 0.5 MG tablet  Commonly known as:  XANAX  Take 0.5 mg by mouth 2 (two) times daily as needed for anxiety.     aspirin EC 81 MG tablet  Take 81 mg by mouth daily.     atorvastatin 80 MG tablet  Commonly known as:  LIPITOR  Take 1 tablet (80 mg total) by mouth daily at 6 PM.     citalopram 20 MG tablet  Commonly known as:  CELEXA  Take 20 mg by mouth daily.     HYDROcodone-acetaminophen 5-325 MG per tablet  Commonly known as:  NORCO/VICODIN  Take 1 tablet by mouth every 6 (six) hours as needed for moderate pain or severe pain.     losartan-hydrochlorothiazide 100-25 MG per tablet  Commonly known as:  HYZAAR  Take 1 tablet by mouth daily.     metoprolol tartrate 25 MG tablet  Commonly known as:  LOPRESSOR  Take 1 tablet (25 mg total) by mouth 2 (two) times daily.     modafinil 100 MG tablet  Commonly known as:  PROVIGIL  Take 100 mg by mouth daily as needed (for off shift work).     nitroGLYCERIN 0.4 MG SL tablet  Commonly known as:  NITROSTAT  Place 1 tablet (0.4 mg total) under the tongue every 5 (five) minutes x 3 doses as needed for chest pain.     sildenafil 50 MG tablet  Commonly known as:  VIAGRA  Take 50 mg by mouth daily as needed for erectile dysfunction.     ticagrelor 90 MG Tabs tablet  Commonly known as:  BRILINTA  Take 1 tablet (90 mg total) by mouth daily.     zolpidem 10  MG tablet  Commonly known as:  AMBIEN  Take 10 mg by mouth at bedtime.            K Dur 20 Meq po daily    Followup plans and appointments: Followup Donnie Aho one week  Time spent with patient to include physician  time:  30 minutes  Signed: Darden Palmer. MD [facility] 11/19/2013, 8:52 AM

## 2013-11-22 MED FILL — Sodium Chloride IV Soln 0.9%: INTRAVENOUS | Qty: 50 | Status: AC

## 2013-11-22 NOTE — ED Provider Notes (Signed)
Medical screening examination/treatment/procedure(s) were conducted as a shared visit with non-physician practitioner(s) or resident  and myself.  I personally evaluated the patient during the encounter and agree with the findings.   I have personally reviewed any xrays and/ or EKG's with the provider and I agree with interpretation.   Cardiac hx, MSK vs cardiac presentation, intermittent chest discomfort with bilateral shoulder/ arm radiation/ pain.  Plan for cardiac work up and obs/ tele rule out.  On exam well appearing, RRR, lungs clear, 5+ strength shoulders/ elbows/ wrists with F/E, sensation intact UE, neck supple.  Pt okay with plan.  EKG no acute findings, reviewed.  Chest pain acute  Enid SkeensJoshua M Wellington Winegarden, MD 11/22/13 1006

## 2013-12-09 ENCOUNTER — Encounter (HOSPITAL_COMMUNITY)
Admission: RE | Admit: 2013-12-09 | Discharge: 2013-12-09 | Disposition: A | Discharge status: Home / Self Care | Payer: BC Managed Care – PPO | Source: Ambulatory Visit | Attending: Cardiology | Admitting: Cardiology

## 2013-12-09 DIAGNOSIS — I2510 Atherosclerotic heart disease of native coronary artery without angina pectoris: Secondary | ICD-10-CM | POA: Insufficient documentation

## 2013-12-09 DIAGNOSIS — Z9861 Coronary angioplasty status: Secondary | ICD-10-CM | POA: Insufficient documentation

## 2013-12-09 NOTE — Progress Notes (Signed)
Cardiac Rehab Medication Review by a Pharmacist  Does the patient  feel that his/her medications are working for him/her?  yes  Has the patient been experiencing any side effects to the medications prescribed?  Yes. Norco does cause constipation, but he takes an OTC stool softener as needed.  Does the patient measure his/her own blood pressure or blood glucose at home?  no   Does the patient have any problems obtaining medications due to transportation or finances?   no  Understanding of regimen: good Understanding of indications: good Potential of compliance: good   Pharmacist comments: Pt reports compliance to all medications. No questions or concerns on medications but does report he feels his medication regimen is working really well for him.  Armandina Stammer 12/09/2013 8:55 AM

## 2013-12-15 ENCOUNTER — Encounter (HOSPITAL_COMMUNITY): Payer: BC Managed Care – PPO

## 2013-12-15 ENCOUNTER — Encounter (HOSPITAL_COMMUNITY)
Admission: RE | Admit: 2013-12-15 | Discharge: 2013-12-15 | Disposition: A | Discharge status: Home / Self Care | Payer: BC Managed Care – PPO | Source: Ambulatory Visit | Attending: Cardiology | Admitting: Cardiology

## 2013-12-15 ENCOUNTER — Encounter (HOSPITAL_COMMUNITY): Payer: Self-pay

## 2013-12-15 DIAGNOSIS — Z9861 Coronary angioplasty status: Secondary | ICD-10-CM | POA: Diagnosis not present

## 2013-12-15 DIAGNOSIS — I2510 Atherosclerotic heart disease of native coronary artery without angina pectoris: Secondary | ICD-10-CM | POA: Diagnosis not present

## 2013-12-15 NOTE — Progress Notes (Signed)
Pt started cardiac rehab today.  Pt tolerated light exercise without difficulty.  VSS, asymptomatic.  Telemetry-NSR with frequent PVC.  PHQ-0.  However pt admits to significant situational stress due to caring for dying mother in law in his home.  Hospice is involved. Pt offered emotional support and reassurance.  Pt also works odd shifts, driving, takes provigil to help stay awake and alert.  Pt has no barriers to rehab participation, exhibits effective coping skills and positive outlook.  Pt goals for rehab include increased stamina with decreased dyspnea and weight loss.  Pt oriented to exercise equipment and routine.  Understanding verbalized.

## 2013-12-17 ENCOUNTER — Encounter (HOSPITAL_COMMUNITY)
Admission: RE | Admit: 2013-12-17 | Discharge: 2013-12-17 | Disposition: A | Discharge status: Home / Self Care | Payer: BC Managed Care – PPO | Source: Ambulatory Visit | Attending: Cardiology | Admitting: Cardiology

## 2013-12-17 ENCOUNTER — Encounter (HOSPITAL_COMMUNITY): Payer: BC Managed Care – PPO

## 2013-12-17 DIAGNOSIS — I2510 Atherosclerotic heart disease of native coronary artery without angina pectoris: Secondary | ICD-10-CM | POA: Diagnosis not present

## 2013-12-20 ENCOUNTER — Encounter (HOSPITAL_COMMUNITY): Payer: BC Managed Care – PPO

## 2013-12-20 ENCOUNTER — Encounter (HOSPITAL_COMMUNITY)
Admission: RE | Admit: 2013-12-20 | Discharge: 2013-12-20 | Disposition: A | Discharge status: Home / Self Care | Payer: BC Managed Care – PPO | Source: Ambulatory Visit | Attending: Cardiology | Admitting: Cardiology

## 2013-12-20 DIAGNOSIS — I2510 Atherosclerotic heart disease of native coronary artery without angina pectoris: Secondary | ICD-10-CM | POA: Diagnosis not present

## 2013-12-22 ENCOUNTER — Encounter (HOSPITAL_COMMUNITY)
Admission: RE | Admit: 2013-12-22 | Discharge: 2013-12-22 | Disposition: A | Discharge status: Home / Self Care | Payer: BC Managed Care – PPO | Source: Ambulatory Visit | Attending: Cardiology | Admitting: Cardiology

## 2013-12-22 ENCOUNTER — Encounter (HOSPITAL_COMMUNITY): Payer: BC Managed Care – PPO

## 2013-12-22 DIAGNOSIS — I2510 Atherosclerotic heart disease of native coronary artery without angina pectoris: Secondary | ICD-10-CM | POA: Diagnosis not present

## 2013-12-24 ENCOUNTER — Encounter (HOSPITAL_COMMUNITY): Payer: BC Managed Care – PPO

## 2013-12-24 ENCOUNTER — Encounter (HOSPITAL_COMMUNITY)
Admission: RE | Admit: 2013-12-24 | Discharge: 2013-12-24 | Disposition: A | Discharge status: Home / Self Care | Payer: BC Managed Care – PPO | Source: Ambulatory Visit | Attending: Cardiology | Admitting: Cardiology

## 2013-12-24 DIAGNOSIS — I2510 Atherosclerotic heart disease of native coronary artery without angina pectoris: Secondary | ICD-10-CM | POA: Diagnosis not present

## 2013-12-27 ENCOUNTER — Encounter (HOSPITAL_COMMUNITY)
Admission: RE | Admit: 2013-12-27 | Discharge: 2013-12-27 | Disposition: A | Discharge status: Home / Self Care | Payer: BC Managed Care – PPO | Source: Ambulatory Visit | Attending: Cardiology | Admitting: Cardiology

## 2013-12-27 ENCOUNTER — Encounter (HOSPITAL_COMMUNITY): Payer: BC Managed Care – PPO

## 2013-12-27 DIAGNOSIS — I2510 Atherosclerotic heart disease of native coronary artery without angina pectoris: Secondary | ICD-10-CM | POA: Diagnosis not present

## 2013-12-29 ENCOUNTER — Encounter (HOSPITAL_COMMUNITY): Payer: BC Managed Care – PPO

## 2013-12-29 ENCOUNTER — Encounter (HOSPITAL_COMMUNITY)
Admission: RE | Admit: 2013-12-29 | Discharge: 2013-12-29 | Disposition: A | Discharge status: Home / Self Care | Payer: BC Managed Care – PPO | Source: Ambulatory Visit | Attending: Cardiology | Admitting: Cardiology

## 2013-12-29 DIAGNOSIS — I2510 Atherosclerotic heart disease of native coronary artery without angina pectoris: Secondary | ICD-10-CM | POA: Diagnosis not present

## 2013-12-29 NOTE — Progress Notes (Signed)
Pt transferred to 6:45 class time due to scheduling conflicts. At the 6:45 exercise class time pt noted to have increased PVC.  Pt reports a history of "PVC's".  Pt did have coffee this morning along with stressful night due to staying up with his Mother in law who is under hospice care for declining health.  Pt takes HCTZ and last labs were at the hospital.  Will fax strips over to Dr. Donnie Aho for review.  Alanson Aly, BSN

## 2013-12-31 ENCOUNTER — Encounter (HOSPITAL_COMMUNITY)
Admission: RE | Admit: 2013-12-31 | Discharge: 2013-12-31 | Disposition: A | Discharge status: Home / Self Care | Payer: BC Managed Care – PPO | Source: Ambulatory Visit | Attending: Cardiology | Admitting: Cardiology

## 2013-12-31 ENCOUNTER — Encounter (HOSPITAL_COMMUNITY): Payer: BC Managed Care – PPO

## 2013-12-31 DIAGNOSIS — I2510 Atherosclerotic heart disease of native coronary artery without angina pectoris: Secondary | ICD-10-CM | POA: Diagnosis not present

## 2014-01-03 ENCOUNTER — Encounter (HOSPITAL_COMMUNITY): Payer: BC Managed Care – PPO

## 2014-01-03 ENCOUNTER — Encounter (HOSPITAL_COMMUNITY)
Admission: RE | Admit: 2014-01-03 | Discharge: 2014-01-03 | Disposition: A | Discharge status: Home / Self Care | Payer: BC Managed Care – PPO | Source: Ambulatory Visit | Attending: Cardiology | Admitting: Cardiology

## 2014-01-03 DIAGNOSIS — I2510 Atherosclerotic heart disease of native coronary artery without angina pectoris: Secondary | ICD-10-CM | POA: Diagnosis not present

## 2014-01-03 NOTE — Progress Notes (Signed)
I have reviewed home exercise with Terry Cook. The patient was advised to walk 2-4 days per week outside of CRP II for 30 minutes continuously.  Pt will also complete one additional day of hand weights outside of CRP II.  Progression of exercise prescription was discussed.  Reviewed THR, pulse, RPE, sign and symptoms, NTG use and when to call 911 or MD.  Pt voiced understanding.  Alexia Freestone, MS, ACSM RCEP 01/03/2014 2:55 PM

## 2014-01-05 ENCOUNTER — Encounter (HOSPITAL_COMMUNITY): Payer: BC Managed Care – PPO

## 2014-01-05 ENCOUNTER — Encounter (HOSPITAL_COMMUNITY)
Admission: RE | Admit: 2014-01-05 | Discharge: 2014-01-05 | Disposition: A | Discharge status: Home / Self Care | Payer: BC Managed Care – PPO | Source: Ambulatory Visit | Attending: Cardiology | Admitting: Cardiology

## 2014-01-05 DIAGNOSIS — I2510 Atherosclerotic heart disease of native coronary artery without angina pectoris: Secondary | ICD-10-CM | POA: Diagnosis not present

## 2014-01-07 ENCOUNTER — Encounter (HOSPITAL_COMMUNITY): Payer: BC Managed Care – PPO

## 2014-01-10 ENCOUNTER — Encounter (HOSPITAL_COMMUNITY): Payer: BC Managed Care – PPO

## 2014-01-10 ENCOUNTER — Encounter (HOSPITAL_COMMUNITY)
Admission: RE | Admit: 2014-01-10 | Discharge: 2014-01-10 | Disposition: A | Discharge status: Home / Self Care | Payer: BC Managed Care – PPO | Source: Ambulatory Visit | Attending: Cardiology | Admitting: Cardiology

## 2014-01-10 DIAGNOSIS — Z955 Presence of coronary angioplasty implant and graft: Secondary | ICD-10-CM | POA: Diagnosis not present

## 2014-01-12 ENCOUNTER — Encounter (HOSPITAL_COMMUNITY): Payer: BC Managed Care – PPO

## 2014-01-12 ENCOUNTER — Encounter (HOSPITAL_COMMUNITY)
Admission: RE | Admit: 2014-01-12 | Discharge: 2014-01-12 | Disposition: A | Discharge status: Home / Self Care | Payer: BC Managed Care – PPO | Source: Ambulatory Visit | Attending: Cardiology | Admitting: Cardiology

## 2014-01-12 DIAGNOSIS — Z955 Presence of coronary angioplasty implant and graft: Secondary | ICD-10-CM | POA: Diagnosis not present

## 2014-01-14 ENCOUNTER — Encounter (HOSPITAL_COMMUNITY)
Admission: RE | Admit: 2014-01-14 | Discharge: 2014-01-14 | Disposition: A | Discharge status: Home / Self Care | Payer: BC Managed Care – PPO | Source: Ambulatory Visit | Attending: Cardiology | Admitting: Cardiology

## 2014-01-14 ENCOUNTER — Encounter (HOSPITAL_COMMUNITY): Payer: BC Managed Care – PPO

## 2014-01-14 DIAGNOSIS — Z955 Presence of coronary angioplasty implant and graft: Secondary | ICD-10-CM | POA: Diagnosis not present

## 2014-01-17 ENCOUNTER — Encounter (HOSPITAL_COMMUNITY): Payer: BC Managed Care – PPO

## 2014-01-17 ENCOUNTER — Encounter (HOSPITAL_COMMUNITY)
Admission: RE | Admit: 2014-01-17 | Discharge: 2014-01-17 | Disposition: A | Discharge status: Home / Self Care | Payer: BC Managed Care – PPO | Source: Ambulatory Visit | Attending: Cardiology | Admitting: Cardiology

## 2014-01-17 DIAGNOSIS — Z955 Presence of coronary angioplasty implant and graft: Secondary | ICD-10-CM | POA: Diagnosis not present

## 2014-01-19 ENCOUNTER — Encounter (HOSPITAL_COMMUNITY)
Admission: RE | Admit: 2014-01-19 | Discharge: 2014-01-19 | Disposition: A | Discharge status: Home / Self Care | Payer: BC Managed Care – PPO | Source: Ambulatory Visit | Attending: Cardiology | Admitting: Cardiology

## 2014-01-19 ENCOUNTER — Encounter (HOSPITAL_COMMUNITY): Payer: BC Managed Care – PPO

## 2014-01-19 DIAGNOSIS — Z955 Presence of coronary angioplasty implant and graft: Secondary | ICD-10-CM | POA: Diagnosis not present

## 2014-01-19 NOTE — Progress Notes (Signed)
Terry Cook 63 y.o. male Nutrition Note Spoke with pt.  Nutrition Survey reviewed with pt. Pt is not currently following the Therapeutic Lifestyle Changes diet. 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 Pt states he wants to lose wt. Pt has not been actively trying to lose wt at this time. Pt expressed understanding of the information reviewed. Pt aware of nutrition education classes offered.  Nutrition Diagnosis   Food-and nutrition-related knowledge deficit related to lack of exposure to information as related to diagnosis of: ? CVD    Overweight related to excessive energy intake as evidenced by a BMI of 29.3  Nutrition Intervention   Benefits of adopting Therapeutic Lifestyle Changes discussed when Medficts reviewed.   Pt to attend the Portion Distortion class   Pt given handouts for: ? Nutrition I class ? Nutrition II class   Continue client-centered nutrition education by RD, as part of interdisciplinary care.  Goal(s)   Pt to eat whole grains for breakfast more often.   Pt to go easy on red meats, eggs and high fat cheeses.   Pt to identify and limit food sources of saturated fat, trans fat, and cholesterol   Pt to identify food quantities necessary to achieve: ? wt loss to a goal wt of 158-176 lb (72.2-80.4 kg) at graduation from cardiac rehab.    Pt to describe the benefit of including fruits, vegetables, whole grains, and low-fat dairy products in a heart healthy meal plan.  Monitor and Evaluate progress toward nutrition goal with team.   Terry PlumbEdna Marites Nath, M.Ed, RD, LDN, CDE 01/19/2014 8:24 AM

## 2014-01-21 ENCOUNTER — Encounter (HOSPITAL_COMMUNITY): Payer: BC Managed Care – PPO

## 2014-01-21 ENCOUNTER — Encounter (HOSPITAL_COMMUNITY)
Admission: RE | Admit: 2014-01-21 | Discharge: 2014-01-21 | Disposition: A | Discharge status: Home / Self Care | Payer: BC Managed Care – PPO | Source: Ambulatory Visit | Attending: Cardiology | Admitting: Cardiology

## 2014-01-21 DIAGNOSIS — Z955 Presence of coronary angioplasty implant and graft: Secondary | ICD-10-CM | POA: Diagnosis not present

## 2014-01-24 ENCOUNTER — Encounter (HOSPITAL_COMMUNITY): Payer: BC Managed Care – PPO

## 2014-01-24 ENCOUNTER — Telehealth (HOSPITAL_COMMUNITY): Payer: Self-pay | Admitting: *Deleted

## 2014-01-26 ENCOUNTER — Encounter (HOSPITAL_COMMUNITY)
Admission: RE | Admit: 2014-01-26 | Discharge: 2014-01-26 | Disposition: A | Discharge status: Home / Self Care | Payer: BC Managed Care – PPO | Source: Ambulatory Visit | Attending: Cardiology | Admitting: Cardiology

## 2014-01-26 ENCOUNTER — Encounter (HOSPITAL_COMMUNITY): Payer: BC Managed Care – PPO

## 2014-01-26 DIAGNOSIS — Z955 Presence of coronary angioplasty implant and graft: Secondary | ICD-10-CM | POA: Diagnosis not present

## 2014-01-28 ENCOUNTER — Encounter (HOSPITAL_COMMUNITY): Payer: BC Managed Care – PPO

## 2014-01-28 ENCOUNTER — Encounter (HOSPITAL_COMMUNITY)
Admission: RE | Admit: 2014-01-28 | Discharge: 2014-01-28 | Disposition: A | Discharge status: Home / Self Care | Payer: BC Managed Care – PPO | Source: Ambulatory Visit | Attending: Cardiology | Admitting: Cardiology

## 2014-01-28 DIAGNOSIS — Z955 Presence of coronary angioplasty implant and graft: Secondary | ICD-10-CM | POA: Diagnosis not present

## 2014-01-31 ENCOUNTER — Encounter (HOSPITAL_COMMUNITY)
Admission: RE | Admit: 2014-01-31 | Discharge: 2014-01-31 | Disposition: A | Discharge status: Home / Self Care | Payer: BC Managed Care – PPO | Source: Ambulatory Visit | Attending: Cardiology | Admitting: Cardiology

## 2014-01-31 ENCOUNTER — Encounter (HOSPITAL_COMMUNITY): Payer: BC Managed Care – PPO

## 2014-01-31 DIAGNOSIS — Z955 Presence of coronary angioplasty implant and graft: Secondary | ICD-10-CM | POA: Diagnosis not present

## 2014-02-02 ENCOUNTER — Encounter (HOSPITAL_COMMUNITY): Payer: BC Managed Care – PPO

## 2014-02-02 ENCOUNTER — Encounter (HOSPITAL_COMMUNITY)
Admission: RE | Admit: 2014-02-02 | Discharge: 2014-02-02 | Disposition: A | Discharge status: Home / Self Care | Payer: BC Managed Care – PPO | Source: Ambulatory Visit | Attending: Cardiology | Admitting: Cardiology

## 2014-02-02 DIAGNOSIS — Z955 Presence of coronary angioplasty implant and graft: Secondary | ICD-10-CM | POA: Diagnosis not present

## 2014-02-04 ENCOUNTER — Encounter (HOSPITAL_COMMUNITY): Payer: BC Managed Care – PPO

## 2014-02-04 ENCOUNTER — Encounter (HOSPITAL_COMMUNITY)
Admission: RE | Admit: 2014-02-04 | Discharge: 2014-02-04 | Disposition: A | Discharge status: Home / Self Care | Payer: BC Managed Care – PPO | Source: Ambulatory Visit | Attending: Cardiology | Admitting: Cardiology

## 2014-02-04 DIAGNOSIS — Z955 Presence of coronary angioplasty implant and graft: Secondary | ICD-10-CM | POA: Diagnosis not present

## 2014-02-07 ENCOUNTER — Encounter (HOSPITAL_COMMUNITY): Payer: BC Managed Care – PPO

## 2014-02-07 ENCOUNTER — Encounter (HOSPITAL_COMMUNITY)
Admission: RE | Admit: 2014-02-07 | Discharge: 2014-02-07 | Disposition: A | Discharge status: Home / Self Care | Payer: BC Managed Care – PPO | Source: Ambulatory Visit | Attending: Cardiology | Admitting: Cardiology

## 2014-02-07 DIAGNOSIS — Z955 Presence of coronary angioplasty implant and graft: Secondary | ICD-10-CM | POA: Insufficient documentation

## 2014-02-09 ENCOUNTER — Encounter (HOSPITAL_COMMUNITY)
Admission: RE | Admit: 2014-02-09 | Discharge: 2014-02-09 | Disposition: A | Discharge status: Home / Self Care | Payer: BC Managed Care – PPO | Source: Ambulatory Visit | Attending: Cardiology | Admitting: Cardiology

## 2014-02-09 ENCOUNTER — Encounter (HOSPITAL_COMMUNITY): Payer: BC Managed Care – PPO

## 2014-02-09 DIAGNOSIS — Z955 Presence of coronary angioplasty implant and graft: Secondary | ICD-10-CM | POA: Diagnosis not present

## 2014-02-11 ENCOUNTER — Encounter (HOSPITAL_COMMUNITY)
Admission: RE | Admit: 2014-02-11 | Discharge: 2014-02-11 | Disposition: A | Discharge status: Home / Self Care | Payer: BC Managed Care – PPO | Source: Ambulatory Visit | Attending: Cardiology | Admitting: Cardiology

## 2014-02-11 ENCOUNTER — Encounter (HOSPITAL_COMMUNITY): Payer: BC Managed Care – PPO

## 2014-02-11 DIAGNOSIS — Z955 Presence of coronary angioplasty implant and graft: Secondary | ICD-10-CM | POA: Diagnosis not present

## 2014-02-14 ENCOUNTER — Encounter (HOSPITAL_COMMUNITY)
Admission: RE | Admit: 2014-02-14 | Discharge: 2014-02-14 | Disposition: A | Discharge status: Home / Self Care | Payer: BC Managed Care – PPO | Source: Ambulatory Visit | Attending: Cardiology | Admitting: Cardiology

## 2014-02-14 ENCOUNTER — Encounter (HOSPITAL_COMMUNITY): Payer: BC Managed Care – PPO

## 2014-02-14 DIAGNOSIS — Z955 Presence of coronary angioplasty implant and graft: Secondary | ICD-10-CM | POA: Diagnosis not present

## 2014-02-16 ENCOUNTER — Encounter (HOSPITAL_COMMUNITY): Payer: BC Managed Care – PPO

## 2014-02-16 ENCOUNTER — Encounter (HOSPITAL_COMMUNITY)
Admission: RE | Admit: 2014-02-16 | Discharge: 2014-02-16 | Disposition: A | Discharge status: Home / Self Care | Payer: BC Managed Care – PPO | Source: Ambulatory Visit | Attending: Cardiology | Admitting: Cardiology

## 2014-02-16 DIAGNOSIS — Z955 Presence of coronary angioplasty implant and graft: Secondary | ICD-10-CM | POA: Diagnosis not present

## 2014-02-18 ENCOUNTER — Encounter (HOSPITAL_COMMUNITY): Payer: BC Managed Care – PPO

## 2014-02-18 ENCOUNTER — Encounter (HOSPITAL_COMMUNITY)
Admission: RE | Admit: 2014-02-18 | Discharge: 2014-02-18 | Disposition: A | Discharge status: Home / Self Care | Payer: BC Managed Care – PPO | Source: Ambulatory Visit | Attending: Cardiology | Admitting: Cardiology

## 2014-02-18 DIAGNOSIS — Z955 Presence of coronary angioplasty implant and graft: Secondary | ICD-10-CM | POA: Diagnosis not present

## 2014-02-21 ENCOUNTER — Encounter (HOSPITAL_COMMUNITY): Payer: BC Managed Care – PPO

## 2014-02-21 ENCOUNTER — Encounter (HOSPITAL_COMMUNITY)
Admission: RE | Admit: 2014-02-21 | Discharge: 2014-02-21 | Disposition: A | Discharge status: Home / Self Care | Payer: BC Managed Care – PPO | Source: Ambulatory Visit | Attending: Cardiology | Admitting: Cardiology

## 2014-02-21 DIAGNOSIS — Z955 Presence of coronary angioplasty implant and graft: Secondary | ICD-10-CM | POA: Diagnosis not present

## 2014-02-23 ENCOUNTER — Encounter (HOSPITAL_COMMUNITY): Payer: BC Managed Care – PPO

## 2014-02-23 ENCOUNTER — Encounter (HOSPITAL_COMMUNITY)
Admission: RE | Admit: 2014-02-23 | Discharge: 2014-02-23 | Disposition: A | Discharge status: Home / Self Care | Payer: BC Managed Care – PPO | Source: Ambulatory Visit | Attending: Cardiology | Admitting: Cardiology

## 2014-02-23 DIAGNOSIS — Z955 Presence of coronary angioplasty implant and graft: Secondary | ICD-10-CM | POA: Diagnosis not present

## 2014-02-25 ENCOUNTER — Encounter (HOSPITAL_COMMUNITY): Payer: BC Managed Care – PPO

## 2014-02-25 ENCOUNTER — Encounter (HOSPITAL_COMMUNITY)
Admission: RE | Admit: 2014-02-25 | Discharge: 2014-02-25 | Disposition: A | Discharge status: Home / Self Care | Payer: BC Managed Care – PPO | Source: Ambulatory Visit | Attending: Cardiology | Admitting: Cardiology

## 2014-02-25 DIAGNOSIS — Z955 Presence of coronary angioplasty implant and graft: Secondary | ICD-10-CM | POA: Diagnosis not present

## 2014-02-28 ENCOUNTER — Encounter (HOSPITAL_COMMUNITY): Payer: BC Managed Care – PPO

## 2014-02-28 ENCOUNTER — Encounter (HOSPITAL_COMMUNITY)
Admission: RE | Admit: 2014-02-28 | Discharge: 2014-02-28 | Disposition: A | Discharge status: Home / Self Care | Payer: BC Managed Care – PPO | Source: Ambulatory Visit | Attending: Cardiology | Admitting: Cardiology

## 2014-02-28 DIAGNOSIS — Z955 Presence of coronary angioplasty implant and graft: Secondary | ICD-10-CM | POA: Diagnosis not present

## 2014-03-02 ENCOUNTER — Encounter (HOSPITAL_COMMUNITY): Payer: BC Managed Care – PPO

## 2014-03-02 ENCOUNTER — Encounter (HOSPITAL_COMMUNITY)
Admission: RE | Admit: 2014-03-02 | Discharge: 2014-03-02 | Disposition: A | Discharge status: Home / Self Care | Payer: BC Managed Care – PPO | Source: Ambulatory Visit | Attending: Cardiology | Admitting: Cardiology

## 2014-03-02 DIAGNOSIS — Z955 Presence of coronary angioplasty implant and graft: Secondary | ICD-10-CM | POA: Diagnosis not present

## 2014-03-04 ENCOUNTER — Encounter (HOSPITAL_COMMUNITY): Payer: BC Managed Care – PPO

## 2014-03-07 ENCOUNTER — Encounter (HOSPITAL_COMMUNITY): Payer: BC Managed Care – PPO

## 2014-03-07 ENCOUNTER — Encounter (HOSPITAL_COMMUNITY)
Admission: RE | Admit: 2014-03-07 | Discharge: 2014-03-07 | Disposition: A | Discharge status: Home / Self Care | Payer: BC Managed Care – PPO | Source: Ambulatory Visit | Attending: Cardiology | Admitting: Cardiology

## 2014-03-07 DIAGNOSIS — Z955 Presence of coronary angioplasty implant and graft: Secondary | ICD-10-CM | POA: Diagnosis not present

## 2014-03-09 ENCOUNTER — Encounter (HOSPITAL_COMMUNITY): Payer: BC Managed Care – PPO

## 2014-03-09 ENCOUNTER — Encounter (HOSPITAL_COMMUNITY)
Admission: RE | Admit: 2014-03-09 | Discharge: 2014-03-09 | Disposition: A | Discharge status: Home / Self Care | Payer: BC Managed Care – PPO | Source: Ambulatory Visit | Attending: Cardiology | Admitting: Cardiology

## 2014-03-09 DIAGNOSIS — Z955 Presence of coronary angioplasty implant and graft: Secondary | ICD-10-CM | POA: Insufficient documentation

## 2014-03-11 ENCOUNTER — Encounter (HOSPITAL_COMMUNITY): Payer: BC Managed Care – PPO

## 2014-03-11 ENCOUNTER — Encounter (HOSPITAL_COMMUNITY)
Admission: RE | Admit: 2014-03-11 | Discharge: 2014-03-11 | Disposition: A | Discharge status: Home / Self Care | Payer: BC Managed Care – PPO | Source: Ambulatory Visit | Attending: Cardiology | Admitting: Cardiology

## 2014-03-11 DIAGNOSIS — Z955 Presence of coronary angioplasty implant and graft: Secondary | ICD-10-CM | POA: Diagnosis not present

## 2014-03-14 ENCOUNTER — Encounter (HOSPITAL_COMMUNITY)
Admission: RE | Admit: 2014-03-14 | Discharge: 2014-03-14 | Disposition: A | Discharge status: Home / Self Care | Payer: BC Managed Care – PPO | Source: Ambulatory Visit | Attending: Cardiology | Admitting: Cardiology

## 2014-03-14 ENCOUNTER — Encounter (HOSPITAL_COMMUNITY): Payer: BC Managed Care – PPO

## 2014-03-14 DIAGNOSIS — Z955 Presence of coronary angioplasty implant and graft: Secondary | ICD-10-CM | POA: Diagnosis not present

## 2014-03-14 NOTE — Progress Notes (Signed)
Pt graduates today from the cardiac rehab phase II program with the completion of 36 exercise sessions.  Pt maintained good attendance to exercise and education classes.  Pt made great progress with MET level progression.  Pt improved from 2.5 to 5.4 over 3 month period.  Pt plans to continue home exercise with walking and plans to join [employer] for continued exercise. Pt met his short term goal to increase stamina and to increase strength.  Pt has struggled with the weight loss and admits he daily indulges in a bowl of ice cream of various sizes every night before bed.  Pt offered other alternatives for bedtime snack that are lower calories and saturated fats.  Pt with increased amount of stress within the home which has resolved since pt began the program.  Pt struggles with making the changes that are necessary due to familiar ties and sense of pleasure with eating high calorie and fat food.  Pt declined any offered counseling from the dietician.  Pt readily admits he has the tools needed he just doesn't want to make the change now.  Phq2 core improved to 1.  Pt's mother in law recently passed away which is the large part of his dissatisfaction and coping during his cardiac event.  Pt time was heavily involved with the care of his mother in law who lived with he and his wife.   Medication list reconciled.  Pt verbalizes compliance with his medication regimen.  It was a joy to have pt in the program, Psychologist, clinical, BSN

## 2014-03-16 ENCOUNTER — Encounter (HOSPITAL_COMMUNITY): Payer: BC Managed Care – PPO

## 2014-03-17 ENCOUNTER — Encounter (HOSPITAL_COMMUNITY): Payer: Self-pay | Admitting: Cardiology

## 2014-03-18 ENCOUNTER — Encounter (HOSPITAL_COMMUNITY): Payer: BC Managed Care – PPO

## 2014-03-21 ENCOUNTER — Encounter (HOSPITAL_COMMUNITY): Payer: BC Managed Care – PPO

## 2014-03-23 ENCOUNTER — Encounter (HOSPITAL_COMMUNITY): Payer: BC Managed Care – PPO

## 2015-07-12 DIAGNOSIS — I10 Essential (primary) hypertension: Secondary | ICD-10-CM | POA: Diagnosis not present

## 2015-07-12 DIAGNOSIS — M5416 Radiculopathy, lumbar region: Secondary | ICD-10-CM | POA: Diagnosis not present

## 2015-07-12 DIAGNOSIS — M545 Low back pain: Secondary | ICD-10-CM | POA: Diagnosis not present

## 2015-07-12 DIAGNOSIS — Z6831 Body mass index (BMI) 31.0-31.9, adult: Secondary | ICD-10-CM | POA: Diagnosis not present

## 2015-07-31 DIAGNOSIS — H43811 Vitreous degeneration, right eye: Secondary | ICD-10-CM | POA: Diagnosis not present

## 2015-07-31 DIAGNOSIS — H43391 Other vitreous opacities, right eye: Secondary | ICD-10-CM | POA: Diagnosis not present

## 2015-07-31 DIAGNOSIS — H40023 Open angle with borderline findings, high risk, bilateral: Secondary | ICD-10-CM | POA: Diagnosis not present

## 2015-07-31 DIAGNOSIS — H5213 Myopia, bilateral: Secondary | ICD-10-CM | POA: Diagnosis not present

## 2015-08-08 DIAGNOSIS — M545 Low back pain: Secondary | ICD-10-CM | POA: Diagnosis not present

## 2015-08-08 DIAGNOSIS — M5416 Radiculopathy, lumbar region: Secondary | ICD-10-CM | POA: Diagnosis not present

## 2015-09-27 DIAGNOSIS — E668 Other obesity: Secondary | ICD-10-CM | POA: Diagnosis not present

## 2015-09-27 DIAGNOSIS — I252 Old myocardial infarction: Secondary | ICD-10-CM | POA: Diagnosis not present

## 2015-09-27 DIAGNOSIS — Z955 Presence of coronary angioplasty implant and graft: Secondary | ICD-10-CM | POA: Diagnosis not present

## 2015-09-27 DIAGNOSIS — G4733 Obstructive sleep apnea (adult) (pediatric): Secondary | ICD-10-CM | POA: Diagnosis not present

## 2015-09-27 DIAGNOSIS — I2510 Atherosclerotic heart disease of native coronary artery without angina pectoris: Secondary | ICD-10-CM | POA: Diagnosis not present

## 2015-09-27 DIAGNOSIS — E785 Hyperlipidemia, unspecified: Secondary | ICD-10-CM | POA: Diagnosis not present

## 2015-09-27 DIAGNOSIS — I10 Essential (primary) hypertension: Secondary | ICD-10-CM | POA: Diagnosis not present

## 2015-10-02 DIAGNOSIS — I10 Essential (primary) hypertension: Secondary | ICD-10-CM | POA: Diagnosis not present

## 2015-10-02 DIAGNOSIS — M519 Unspecified thoracic, thoracolumbar and lumbosacral intervertebral disc disorder: Secondary | ICD-10-CM | POA: Diagnosis not present

## 2015-10-02 DIAGNOSIS — R413 Other amnesia: Secondary | ICD-10-CM | POA: Diagnosis not present

## 2015-10-02 DIAGNOSIS — F339 Major depressive disorder, recurrent, unspecified: Secondary | ICD-10-CM | POA: Diagnosis not present

## 2015-10-02 DIAGNOSIS — I2510 Atherosclerotic heart disease of native coronary artery without angina pectoris: Secondary | ICD-10-CM | POA: Diagnosis not present

## 2015-10-02 DIAGNOSIS — Z955 Presence of coronary angioplasty implant and graft: Secondary | ICD-10-CM | POA: Diagnosis not present

## 2015-10-02 DIAGNOSIS — Z79899 Other long term (current) drug therapy: Secondary | ICD-10-CM | POA: Diagnosis not present

## 2015-10-02 DIAGNOSIS — G479 Sleep disorder, unspecified: Secondary | ICD-10-CM | POA: Diagnosis not present

## 2015-10-02 DIAGNOSIS — E7800 Pure hypercholesterolemia, unspecified: Secondary | ICD-10-CM | POA: Diagnosis not present

## 2015-10-02 DIAGNOSIS — F419 Anxiety disorder, unspecified: Secondary | ICD-10-CM | POA: Diagnosis not present

## 2015-11-02 DIAGNOSIS — M545 Low back pain: Secondary | ICD-10-CM | POA: Diagnosis not present

## 2015-11-02 DIAGNOSIS — M5416 Radiculopathy, lumbar region: Secondary | ICD-10-CM | POA: Diagnosis not present

## 2015-11-02 DIAGNOSIS — Z6830 Body mass index (BMI) 30.0-30.9, adult: Secondary | ICD-10-CM | POA: Diagnosis not present

## 2016-01-01 DIAGNOSIS — M5416 Radiculopathy, lumbar region: Secondary | ICD-10-CM | POA: Diagnosis not present

## 2016-01-01 DIAGNOSIS — M545 Low back pain: Secondary | ICD-10-CM | POA: Diagnosis not present

## 2016-01-01 DIAGNOSIS — Z6830 Body mass index (BMI) 30.0-30.9, adult: Secondary | ICD-10-CM | POA: Diagnosis not present

## 2016-03-27 DIAGNOSIS — M545 Low back pain: Secondary | ICD-10-CM | POA: Diagnosis not present

## 2016-03-27 DIAGNOSIS — M5416 Radiculopathy, lumbar region: Secondary | ICD-10-CM | POA: Diagnosis not present

## 2016-04-03 DIAGNOSIS — M545 Low back pain: Secondary | ICD-10-CM | POA: Diagnosis not present

## 2016-04-04 DIAGNOSIS — F419 Anxiety disorder, unspecified: Secondary | ICD-10-CM | POA: Diagnosis not present

## 2016-04-04 DIAGNOSIS — I10 Essential (primary) hypertension: Secondary | ICD-10-CM | POA: Diagnosis not present

## 2016-04-04 DIAGNOSIS — Z79899 Other long term (current) drug therapy: Secondary | ICD-10-CM | POA: Diagnosis not present

## 2016-04-04 DIAGNOSIS — F339 Major depressive disorder, recurrent, unspecified: Secondary | ICD-10-CM | POA: Diagnosis not present

## 2016-04-04 DIAGNOSIS — Z23 Encounter for immunization: Secondary | ICD-10-CM | POA: Diagnosis not present

## 2016-04-04 DIAGNOSIS — E7800 Pure hypercholesterolemia, unspecified: Secondary | ICD-10-CM | POA: Diagnosis not present

## 2016-04-04 DIAGNOSIS — M519 Unspecified thoracic, thoracolumbar and lumbosacral intervertebral disc disorder: Secondary | ICD-10-CM | POA: Diagnosis not present

## 2016-04-29 DIAGNOSIS — E876 Hypokalemia: Secondary | ICD-10-CM | POA: Diagnosis not present

## 2016-05-01 DIAGNOSIS — M545 Low back pain: Secondary | ICD-10-CM | POA: Diagnosis not present

## 2016-05-01 DIAGNOSIS — M47816 Spondylosis without myelopathy or radiculopathy, lumbar region: Secondary | ICD-10-CM | POA: Diagnosis not present

## 2016-05-01 DIAGNOSIS — G5603 Carpal tunnel syndrome, bilateral upper limbs: Secondary | ICD-10-CM | POA: Diagnosis not present

## 2016-05-14 DIAGNOSIS — M5416 Radiculopathy, lumbar region: Secondary | ICD-10-CM | POA: Diagnosis not present

## 2016-05-14 DIAGNOSIS — M545 Low back pain: Secondary | ICD-10-CM | POA: Diagnosis not present

## 2016-05-14 DIAGNOSIS — M47816 Spondylosis without myelopathy or radiculopathy, lumbar region: Secondary | ICD-10-CM | POA: Diagnosis not present

## 2016-07-03 DIAGNOSIS — M545 Low back pain: Secondary | ICD-10-CM | POA: Diagnosis not present

## 2016-07-03 DIAGNOSIS — M47816 Spondylosis without myelopathy or radiculopathy, lumbar region: Secondary | ICD-10-CM | POA: Diagnosis not present

## 2016-08-05 DIAGNOSIS — H40023 Open angle with borderline findings, high risk, bilateral: Secondary | ICD-10-CM | POA: Insufficient documentation

## 2016-08-05 DIAGNOSIS — H52203 Unspecified astigmatism, bilateral: Secondary | ICD-10-CM | POA: Diagnosis not present

## 2016-08-05 DIAGNOSIS — H2513 Age-related nuclear cataract, bilateral: Secondary | ICD-10-CM | POA: Diagnosis not present

## 2016-08-05 DIAGNOSIS — H5213 Myopia, bilateral: Secondary | ICD-10-CM | POA: Diagnosis not present

## 2016-08-12 DIAGNOSIS — M5416 Radiculopathy, lumbar region: Secondary | ICD-10-CM | POA: Diagnosis not present

## 2016-09-05 DIAGNOSIS — M5416 Radiculopathy, lumbar region: Secondary | ICD-10-CM | POA: Diagnosis not present

## 2016-09-05 DIAGNOSIS — M545 Low back pain: Secondary | ICD-10-CM | POA: Diagnosis not present

## 2016-09-25 DIAGNOSIS — E668 Other obesity: Secondary | ICD-10-CM | POA: Diagnosis not present

## 2016-09-25 DIAGNOSIS — I2510 Atherosclerotic heart disease of native coronary artery without angina pectoris: Secondary | ICD-10-CM | POA: Diagnosis not present

## 2016-09-25 DIAGNOSIS — E785 Hyperlipidemia, unspecified: Secondary | ICD-10-CM | POA: Diagnosis not present

## 2016-09-25 DIAGNOSIS — I10 Essential (primary) hypertension: Secondary | ICD-10-CM | POA: Diagnosis not present

## 2016-09-30 DIAGNOSIS — F419 Anxiety disorder, unspecified: Secondary | ICD-10-CM | POA: Diagnosis not present

## 2016-09-30 DIAGNOSIS — E7800 Pure hypercholesterolemia, unspecified: Secondary | ICD-10-CM | POA: Diagnosis not present

## 2016-09-30 DIAGNOSIS — I10 Essential (primary) hypertension: Secondary | ICD-10-CM | POA: Diagnosis not present

## 2016-09-30 DIAGNOSIS — F339 Major depressive disorder, recurrent, unspecified: Secondary | ICD-10-CM | POA: Diagnosis not present

## 2016-10-03 DIAGNOSIS — M545 Low back pain: Secondary | ICD-10-CM | POA: Diagnosis not present

## 2016-10-03 DIAGNOSIS — M47816 Spondylosis without myelopathy or radiculopathy, lumbar region: Secondary | ICD-10-CM | POA: Diagnosis not present

## 2016-10-03 DIAGNOSIS — M5416 Radiculopathy, lumbar region: Secondary | ICD-10-CM | POA: Diagnosis not present

## 2016-10-03 DIAGNOSIS — I10 Essential (primary) hypertension: Secondary | ICD-10-CM | POA: Diagnosis not present

## 2016-10-29 ENCOUNTER — Telehealth: Payer: Self-pay | Admitting: Hematology and Oncology

## 2016-10-29 ENCOUNTER — Encounter: Payer: Self-pay | Admitting: Hematology and Oncology

## 2016-10-29 NOTE — Telephone Encounter (Signed)
Appt has been scheduled for the pt to see Dr. Gweneth DimitriPerlov on 8/6 at 11am. Pt aware to arrive 30 minutes early. Address and insurance updated. Letter mailed.

## 2016-11-11 ENCOUNTER — Ambulatory Visit (HOSPITAL_BASED_OUTPATIENT_CLINIC_OR_DEPARTMENT_OTHER): Payer: Medicare Other

## 2016-11-11 ENCOUNTER — Encounter: Payer: Self-pay | Admitting: Hematology and Oncology

## 2016-11-11 ENCOUNTER — Telehealth: Payer: Self-pay | Admitting: Hematology and Oncology

## 2016-11-11 ENCOUNTER — Ambulatory Visit (HOSPITAL_BASED_OUTPATIENT_CLINIC_OR_DEPARTMENT_OTHER): Payer: Medicare Other | Admitting: Hematology and Oncology

## 2016-11-11 VITALS — BP 152/76 | HR 61 | Temp 98.8°F | Resp 18 | Ht 66.25 in | Wt 183.9 lb

## 2016-11-11 DIAGNOSIS — D696 Thrombocytopenia, unspecified: Secondary | ICD-10-CM | POA: Insufficient documentation

## 2016-11-11 DIAGNOSIS — M545 Low back pain: Secondary | ICD-10-CM

## 2016-11-11 DIAGNOSIS — N400 Enlarged prostate without lower urinary tract symptoms: Secondary | ICD-10-CM

## 2016-11-11 LAB — COMPREHENSIVE METABOLIC PANEL
ALBUMIN: 4.3 g/dL (ref 3.5–5.0)
ALK PHOS: 96 U/L (ref 40–150)
ALT: 25 U/L (ref 0–55)
AST: 28 U/L (ref 5–34)
Anion Gap: 8 mEq/L (ref 3–11)
BILIRUBIN TOTAL: 1.37 mg/dL — AB (ref 0.20–1.20)
BUN: 10.5 mg/dL (ref 7.0–26.0)
CALCIUM: 9.6 mg/dL (ref 8.4–10.4)
CO2: 33 mEq/L — ABNORMAL HIGH (ref 22–29)
CREATININE: 0.9 mg/dL (ref 0.7–1.3)
Chloride: 102 mEq/L (ref 98–109)
EGFR: 89 mL/min/{1.73_m2} — ABNORMAL LOW (ref >90)
Glucose: 84 mg/dl (ref 70–140)
Potassium: 3.2 mEq/L — ABNORMAL LOW (ref 3.5–5.1)
Sodium: 143 mEq/L (ref 136–145)
Total Protein: 7.3 g/dL (ref 6.4–8.3)

## 2016-11-11 LAB — MORPHOLOGY: PLT EST: DECREASED

## 2016-11-11 LAB — CBC & DIFF AND RETIC
BASO%: 0.3 % (ref 0.0–2.0)
BASOS ABS: 0 10*3/uL (ref 0.0–0.1)
EOS%: 6 % (ref 0.0–7.0)
Eosinophils Absolute: 0.4 10*3/uL (ref 0.0–0.5)
HCT: 45.9 % (ref 38.4–49.9)
HEMOGLOBIN: 16.4 g/dL (ref 13.0–17.1)
Immature Retic Fract: 4.5 % (ref 3.00–10.60)
LYMPH%: 28.7 % (ref 14.0–49.0)
MCH: 31.7 pg (ref 27.2–33.4)
MCHC: 35.7 g/dL (ref 32.0–36.0)
MCV: 88.6 fL (ref 79.3–98.0)
MONO#: 0.5 10*3/uL (ref 0.1–0.9)
MONO%: 8.5 % (ref 0.0–14.0)
NEUT%: 56.5 % (ref 39.0–75.0)
NEUTROS ABS: 3.6 10*3/uL (ref 1.5–6.5)
NRBC: 0 % (ref 0–0)
Platelets: 105 10*3/uL — ABNORMAL LOW (ref 140–400)
RBC: 5.18 10*6/uL (ref 4.20–5.82)
RDW: 13.4 % (ref 11.0–14.6)
Retic %: 1.69 % (ref 0.80–1.80)
Retic Ct Abs: 87.54 10*3/uL (ref 34.80–93.90)
WBC: 6.4 10*3/uL (ref 4.0–10.3)
lymph#: 1.8 10*3/uL (ref 0.9–3.3)

## 2016-11-11 LAB — LACTATE DEHYDROGENASE: LDH: 233 U/L (ref 125–245)

## 2016-11-11 NOTE — Progress Notes (Signed)
[facility] New Visit:  Assessment: Thrombocytopenia (HCC) 66 year old male with mild, slowly progressive thrombocytopenia notable on the lab work for at least 5 years and may be longer. No associated pathological bleeding. No signs or symptoms to suggest a lymphoproliferative or myeloproliferative disorder based on available lab work.  Review of hematological profile hysterectomy demonstrates normal white blood cell count without immature forms or basophilia. Patient is not anemic and his red blood cells have been of normal size coloration without dysmorphic forms.  For initial diagnosis is broad and includes pseudothrombocytopenia due to EDTA reaction, possible subclinical splenomegaly due to either primary splenic enlargement or secondary enlargement due to portal flow disruption. Early MDS, lymphoproliferative disorder, or leukemia cannot be fully excluded this time. She also has family history of systemic lupus and autoimmune condition may also be a contributor here especially considering a reactive skin that patient demonstrates using his CPAP mask.  Plan: --Repeat labs today --Spleen US --RTC with [place] in 1 month to review findings  Voice recognition software was used and creation of this note. Despite my best effort at editing the text, some misspelling/errors may have occurred.   Orders Placed This Encounter  Procedures  . US Abdomen Limited    Standing Status:   Future    Standing Expiration Date:   11/11/2017    Order Specific Question:   Reason for Exam (SYMPTOM  OR DIAGNOSIS REQUIRED)    Answer:   Eval for splenomegaly due to thrombocytopenia    Order Specific Question:   Preferred imaging location?    Answer:   [facility]  . CBC & Diff and Retic    Standing Status:   Future    Number of Occurrences:   1    Standing Expiration Date:   11/11/2017  . Morphology    Standing Status:   Future    Number of Occurrences:   1    Standing Expiration Date:    11/11/2017  . Comprehensive metabolic panel    Standing Status:   Future    Number of Occurrences:   1    Standing Expiration Date:   11/11/2017  . Lactate dehydrogenase (LDH)    Standing Status:   Future    Number of Occurrences:   1    Standing Expiration Date:   11/11/2017  . ANA, IFA (with reflex)    Standing Status:   Future    Number of Occurrences:   1    Standing Expiration Date:   11/11/2017  . Rheumatoid factor    Standing Status:   Future    Number of Occurrences:   1    Standing Expiration Date:   11/11/2017    All questions were answered.  . The patient knows to call the clinic with any problems, questions or concerns.  This note was electronically signed.    History of Presenting Illness Terry Cook 66 y.o. presenting [facility] the [facility] for evaluation of thrombocytopenia. He was noted to have a slowly declining platelet counts over the past 5 years without any additional symptoms attributed to the change. He is curious about the explanation especially in the context of his family cancer history. They see the review of trends of the blood counts below in the hematological history.  At this time, patient's main problem is lower back pain for which she is taking chronic opioid medications. The pain is highly disruptive to his activities of daily living. He is unable to function  without pain relievers., He has tried injections, but they have not been having any significant benefit. Currently, patient is planning on seeing a neurosurgeon for possible surgical correction. Additionally, patient describes significant problems with urination that he attributes to enlarged prostate. The symptoms include increased straining, slow urine flow, and then completed voiding. Patient denies any pain, hematuria. He also noticed that he is urine became more foamy lately. He denies any change in order or color of the urine.  Patient denies any fevers, chills, night sweats. No unexpected weight  loss. He continues to work part time approximately 20 hours a week. He is able to get through his work today, but does get tired after 6 hours. Denies any oral sores, skin rash, swollen lymph nodes, easy bruisability, pathological bleeding.   Oncological/hematological History: --Labs, 07/18/08: WBC 12.4, Hgb 15.0, Plt 146 --Labs, 06/05/11: WBC 8.8, Hgb 13.9, Plt 118 --Labs, 11/19/13: WBC 9.2, Hgb 14.9, Plt 114;   --Labs, 03/24/15: WBC 7.3, Hgb 15.8, Plt 118, MPV 8.9 --Labs, 04/04/16: WBC 8.0, Hgb 16.7, Plt 119, MPV 8.9 --Labs, 10/04/16: WBC 8.3, Hgb 16.8, Plt 109, MPV 10.0  Medical History: Past Medical History:  Diagnosis Date  . Anxiety   . CAD (coronary artery disease), native coronary artery 06/04/2011   s/p MI with PCI to left circ  . Chronic lower back pain   . DDD (degenerative disc disease), lumbar   . Depression   . High cholesterol   . Hypertension   . Irregular heart beats   . Kidney stones ~ 1975  . MI (myocardial infarction) 05/2011  . OSA on CPAP     Surgical History: Past Surgical History:  Procedure Laterality Date  . CORONARY ANGIOPLASTY WITH STENT PLACEMENT  05/2011   "1"  . CYST EXCISION Right ~ 2009   "index finger; in office"  . CYSTOSCOPY W/ STONE MANIPULATION  ~ 1975 X 2  . LEFT HEART CATHETERIZATION WITH CORONARY ANGIOGRAM N/A 06/04/2011   Procedure: LEFT HEART CATHETERIZATION WITH CORONARY ANGIOGRAM;  Surgeon: Othella Boyer, MD;  Location: [facility] CATH LAB;  Service: Cardiovascular;  Laterality: N/A;  . LEFT HEART CATHETERIZATION WITH CORONARY ANGIOGRAM N/A 11/18/2013   Procedure: LEFT HEART CATHETERIZATION WITH CORONARY ANGIOGRAM;  Surgeon: Lesleigh Noe, MD;  Location: [facility] CATH LAB;  Service: Cardiovascular;  Laterality: N/A;  . PERCUTANEOUS CORONARY STENT INTERVENTION (PCI-S)  11/18/2013   Procedure: PERCUTANEOUS CORONARY STENT INTERVENTION (PCI-S);  Surgeon: Lesleigh Noe, MD;  Location: [facility] CATH LAB;  Service: Cardiovascular;;    Family  History: Family History  Problem Relation Age of Onset  . Coronary artery disease Father 23  . Coronary artery disease Brother 55    Social History: Social History   Social History  . Marital status: Married    Spouse name: N/A  . Number of children: N/A  . Years of education: N/A   Occupational History  . Not on file.   Social History Main Topics  . Smoking status: Never Smoker  . Smokeless tobacco: Never Used  . Alcohol use 1.2 oz/week    2 Glasses of wine per week  . Drug use: No  . Sexual activity: Yes   Other Topics Concern  . Not on file   Social History Narrative  . No narrative on file    Allergies: No Known Allergies  Medications:  Current Outpatient Prescriptions  Medication Sig Dispense Refill  . ALPRAZolam (XANAX) 0.5 MG tablet Take 0.5 mg by mouth 2 (two) times daily as  needed for anxiety.    Marland Kitchen. amLODipine (NORVASC) 10 MG tablet Take 10 mg by mouth daily.  12  . aspirin EC 81 MG tablet Take 81 mg by mouth daily.    . clopidogrel (PLAVIX) 75 MG tablet Take 75 mg by mouth daily.    Marland Kitchen. HYDROcodone-acetaminophen (NORCO/VICODIN) 5-325 MG tablet Take 1 tablet by mouth every 6 (six) hours as needed for moderate pain.    Marland Kitchen. losartan-hydrochlorothiazide (HYZAAR) 100-25 MG per tablet Take 1 tablet by mouth daily.    Marland Kitchen. oxyCODONE (OXY IR/ROXICODONE) 5 MG immediate release tablet TAKE 1/2-1 TABLET BY MOUTH EVERY 6-8 HOURS AS NEEDED    . OxyCODONE ER (XTAMPZA ER) 9 MG C12A Take 9 mg by mouth 2 (two) times daily.    Marland Kitchen. zolpidem (AMBIEN) 10 MG tablet Take 10 mg by mouth at bedtime.    Marland Kitchen. atorvastatin (LIPITOR) 80 MG tablet Take 1 tablet (80 mg total) by mouth daily at 6 PM. 30 tablet 12  . nitroGLYCERIN (NITROSTAT) 0.4 MG SL tablet Place 1 tablet (0.4 mg total) under the tongue every 5 (five) minutes x 3 doses as needed for chest pain. 25 tablet 4   No current facility-administered medications for this visit.     Review of Systems: Review of Systems  Constitutional:  Negative.   HENT:  Negative.   Eyes: Negative.   Respiratory: Negative.   Cardiovascular: Negative.   Gastrointestinal: Negative.   Endocrine: Negative.   Genitourinary: Positive for difficulty urinating, frequency and nocturia. Negative for bladder incontinence, dysuria and hematuria.   Musculoskeletal: Positive for back pain. Negative for arthralgias, flank pain, myalgias, neck pain and neck stiffness.  Skin: Negative.   Neurological: Negative.   Hematological: Negative.   Psychiatric/Behavioral: Negative.      PHYSICAL EXAMINATION Blood pressure (!) 152/76, pulse 61, temperature 98.8 F (37.1 C), temperature source Oral, resp. rate 18, height 5' 6.25" (1.683 m), weight 183 lb 14.4 oz (83.4 kg), SpO2 98 %.  ECOG PERFORMANCE STATUS: 1 - Symptomatic but completely ambulatory  Physical Exam  Constitutional: He is oriented to person, place, and time and well-developed, well-nourished, and in no distress. No distress.  HENT:  Head: Normocephalic and atraumatic.  Mouth/Throat: Oropharynx is clear and moist. No oropharyngeal exudate.  Eyes: Pupils are equal, round, and reactive to light. Conjunctivae and EOM are normal. No scleral icterus.  Neck: Normal range of motion. No thyromegaly present.  Cardiovascular: Normal rate, regular rhythm and normal heart sounds.   No murmur heard. Pulmonary/Chest: Effort normal and breath sounds normal. No respiratory distress. He has no rales.  Abdominal: Soft. Bowel sounds are normal. He exhibits no distension and no mass. There is no hepatosplenomegaly. There is no tenderness.  Musculoskeletal: Normal range of motion. He exhibits no edema or tenderness.  Lymphadenopathy:       Head (right side): No submandibular and no occipital adenopathy present.       Head (left side): No submandibular and no occipital adenopathy present.    He has no cervical adenopathy.    He has no axillary adenopathy.       Right: No inguinal and no supraclavicular  adenopathy present.       Left: No inguinal and no supraclavicular adenopathy present.  Neurological: He is alert and oriented to person, place, and time. He has normal reflexes. No cranial nerve deficit.  Skin: Skin is warm and dry. He is not diaphoretic.  Facial erythema with a patch over the medial forehead and bilateral nasolabial  fold redness, pattern consistent with CPAP mask attachment  Nursing note and vitals reviewed.    LABORATORY DATA: I have personally reviewed the data as listed: Appointment on 11/11/2016  Component Date Value Ref Range Status  . WBC 11/11/2016 6.4  4.0 - 10.3 10e3/uL Final  . NEUT# 11/11/2016 3.6  1.5 - 6.5 10e3/uL Final  . HGB 11/11/2016 16.4  13.0 - 17.1 g/dL Final  . HCT 16/01/9603 45.9  38.4 - 49.9 % Final  . Platelets 11/11/2016 105* 140 - 400 10e3/uL Final  . MCV 11/11/2016 88.6  79.3 - 98.0 fL Final  . MCH 11/11/2016 31.7  27.2 - 33.4 pg Final  . MCHC 11/11/2016 35.7  32.0 - 36.0 g/dL Final  . RBC 54/12/8117 5.18  4.20 - 5.82 10e6/uL Final  . RDW 11/11/2016 13.4  11.0 - 14.6 % Final  . lymph# 11/11/2016 1.8  0.9 - 3.3 10e3/uL Final  . MONO# 11/11/2016 0.5  0.1 - 0.9 10e3/uL Final  . Eosinophils Absolute 11/11/2016 0.4  0.0 - 0.5 10e3/uL Final  . Basophils Absolute 11/11/2016 0.0  0.0 - 0.1 10e3/uL Final  . NEUT% 11/11/2016 56.5  39.0 - 75.0 % Final  . LYMPH% 11/11/2016 28.7  14.0 - 49.0 % Final  . MONO% 11/11/2016 8.5  0.0 - 14.0 % Final  . EOS% 11/11/2016 6.0  0.0 - 7.0 % Final  . BASO% 11/11/2016 0.3  0.0 - 2.0 % Final  . nRBC 11/11/2016 0  0 - 0 % Final  . Retic % 11/11/2016 1.69  0.80 - 1.80 % Final  . Retic Ct Abs 11/11/2016 87.54  34.80 - 93.90 10e3/uL Final  . Immature Retic Fract 11/11/2016 4.50  3.00 - 10.60 % Final        Daisy Blossom, MD

## 2016-11-11 NOTE — Telephone Encounter (Signed)
Scheduled appt per 8/6 los - Gave patient AVS and calender per los.  

## 2016-11-11 NOTE — Patient Instructions (Signed)
Thank you for choosing [facility] to provide your oncology and hematology care.  To afford each patient quality time with our providers, please arrive 30 minutes before your scheduled appointment time.  If you arrive late for your appointment, you may be asked to reschedule.  We strive to give you quality time with our providers, and arriving late affects you and other patients whose appointments are after yours.   If you are a no show for multiple scheduled visits, you may be dismissed from the clinic at the providers discretion.    Again, thank you for choosing [facility], our hope is that these requests will decrease the amount of time that you wait before being seen by our physicians.  ______________________________________________________________________  Should you have questions after your visit to the [facility], please contact our office at [phone] between the hours of 8:30 and 4:30 p.m.    Voicemails left after 4:30p.m will not be returned until the following business day.    For prescription refill requests, please have your pharmacy contact us directly.  Please also try to allow 48 hours for prescription requests.    Please contact the scheduling department for questions regarding scheduling.  For scheduling of procedures such as PET scans, CT scans, MRI, Ultrasound, etc please contact central scheduling at [phone].    Resources For Cancer Patients and Caregivers:   Oncolink.org:  A wonderful resource for patients and healthcare providers for information regarding your disease, ways to tract your treatment, what to expect, etc.     American Cancer Society:  800-227-2345  Can help patients locate various types of support and financial assistance  Cancer Care: 1-800-813-HOPE (4673) Provides financial assistance, online support groups, medication/co-pay assistance.    [place] DSS:  336-641-3447 Where to apply for food  stamps, Medicaid, and utility assistance  Medicare Rights Center: 800-333-4114 Helps people with Medicare understand their rights and benefits, navigate the Medicare system, and secure the quality healthcare they deserve  SCAT: 336-333-6589 [place] [employer]'s shared-ride transportation service for eligible riders who have a disability that prevents them from riding the fixed route bus.    For additional information on assistance programs please contact our social worker:   Grier Hock/Abigail Elmore:  [phone]            

## 2016-11-11 NOTE — Assessment & Plan Note (Signed)
66 year old male with mild, slowly progressive thrombocytopenia notable on the lab work for at least 5 years and may be longer. No associated pathological bleeding. No signs or symptoms to suggest a lymphoproliferative or myeloproliferative disorder based on available lab work.  Review of hematological profile hysterectomy demonstrates normal white blood cell count without immature forms or basophilia. Patient is not anemic and his red blood cells have been of normal size coloration without dysmorphic forms.  For initial diagnosis is broad and includes pseudothrombocytopenia due to EDTA reaction, possible subclinical splenomegaly due to either primary splenic enlargement or secondary enlargement due to portal flow disruption. Early MDS, lymphoproliferative disorder, or leukemia cannot be fully excluded this time. She also has family history of systemic lupus and autoimmune condition may also be a contributor here especially considering a reactive skin that patient demonstrates using his CPAP mask.  Plan: --Repeat labs today --Spleen US --RTC with us in 1 month to review findings  Voice recognition software was used and creation of this note. Despite my best effort at editing the text, some misspelling/errors may have occurred.

## 2016-11-12 LAB — ANTINUCLEAR ANTIBODIES, IFA: ANA Titer 1: NEGATIVE

## 2016-11-12 LAB — RHEUMATOID FACTOR: RA Latex Turbid.: <10 IU/mL (ref 0.0–13.9)

## 2016-12-06 ENCOUNTER — Ambulatory Visit (HOSPITAL_COMMUNITY)
Admission: RE | Admit: 2016-12-06 | Discharge: 2016-12-06 | Disposition: A | Discharge status: Home / Self Care | Payer: Medicare Other | Source: Ambulatory Visit | Attending: Hematology and Oncology | Admitting: Hematology and Oncology

## 2016-12-06 DIAGNOSIS — D696 Thrombocytopenia, unspecified: Secondary | ICD-10-CM | POA: Diagnosis not present

## 2016-12-12 ENCOUNTER — Encounter: Payer: Self-pay | Admitting: Hematology and Oncology

## 2016-12-12 ENCOUNTER — Ambulatory Visit (HOSPITAL_BASED_OUTPATIENT_CLINIC_OR_DEPARTMENT_OTHER): Payer: Medicare Other | Admitting: Hematology and Oncology

## 2016-12-12 ENCOUNTER — Telehealth: Payer: Self-pay | Admitting: Hematology and Oncology

## 2016-12-12 VITALS — BP 131/62 | HR 58 | Temp 98.0°F | Resp 18 | Ht 66.25 in | Wt 183.2 lb

## 2016-12-12 DIAGNOSIS — R300 Dysuria: Secondary | ICD-10-CM

## 2016-12-12 DIAGNOSIS — R350 Frequency of micturition: Secondary | ICD-10-CM | POA: Diagnosis not present

## 2016-12-12 DIAGNOSIS — R351 Nocturia: Secondary | ICD-10-CM

## 2016-12-12 DIAGNOSIS — M545 Low back pain: Secondary | ICD-10-CM

## 2016-12-12 DIAGNOSIS — D696 Thrombocytopenia, unspecified: Secondary | ICD-10-CM

## 2016-12-12 NOTE — Patient Instructions (Signed)
Thank you for choosing [facility] to provide your oncology and hematology care.  To afford each patient quality time with our providers, please arrive 30 minutes before your scheduled appointment time.  If you arrive late for your appointment, you may be asked to reschedule.  We strive to give you quality time with our providers, and arriving late affects you and other patients whose appointments are after yours.   If you are a no show for multiple scheduled visits, you may be dismissed from the clinic at the providers discretion.    Again, thank you for choosing [facility], our hope is that these requests will decrease the amount of time that you wait before being seen by our physicians.  ______________________________________________________________________  Should you have questions after your visit to the [facility], please contact our office at [phone] between the hours of 8:30 and 4:30 p.m.    Voicemails left after 4:30p.m will not be returned until the following business day.    For prescription refill requests, please have your pharmacy contact us directly.  Please also try to allow 48 hours for prescription requests.    Please contact the scheduling department for questions regarding scheduling.  For scheduling of procedures such as PET scans, CT scans, MRI, Ultrasound, etc please contact central scheduling at [phone].    Resources For Cancer Patients and Caregivers:   Oncolink.org:  A wonderful resource for patients and healthcare providers for information regarding your disease, ways to tract your treatment, what to expect, etc.     American Cancer Society:  800-227-2345  Can help patients locate various types of support and financial assistance  Cancer Care: 1-800-813-HOPE (4673) Provides financial assistance, online support groups, medication/co-pay assistance.    [place] DSS:  336-641-3447 Where to apply for food  stamps, Medicaid, and utility assistance  Medicare Rights Center: 800-333-4114 Helps people with Medicare understand their rights and benefits, navigate the Medicare system, and secure the quality healthcare they deserve  SCAT: 336-333-6589 [place] [employer]'s shared-ride transportation service for eligible riders who have a disability that prevents them from riding the fixed route bus.    For additional information on assistance programs please contact our social worker:   Grier Hock/Abigail Elmore:  [phone]            

## 2016-12-12 NOTE — Telephone Encounter (Signed)
Gave patient avs and calendar for December apppointment

## 2016-12-16 NOTE — Progress Notes (Signed)
[facility] Follow-up Visit:  Assessment: Thrombocytopenia (Terry Cook) 66 year old male with mild, slowly progressive thrombocytopenia notable on the lab work for at least 5 years and may be longer. No associated pathological bleeding. No signs or symptoms to suggest a lymphoproliferative or myeloproliferative disorder based on available lab work.  Repeat lab work demonstrates reasonably stable platelet count with large platelets present. This suggests either medication side effect, myelodysplasia, or possible chronic ITP. Ultrasound of the spleen demonstrates no significant splenic enlargement. At this time, no intervention would be recommended. For the less, due to progressive thrombocytopenia, HEENT observation is warranted.  Plan: --RTC with [place] in 16month for clinical monitoring   Voice recognition software was used and creation of this note. Despite my best effort at editing the text, some misspelling/errors may have occurred.  Orders Placed This Encounter  Procedures  . CBC with Differential    Standing Status:   Future    Standing Expiration Date:   12/12/2017    All questions were answered.  . The patient knows to call the clinic with any problems, questions or concerns.  This note was electronically signed.    History of Presenting Illness Terry ROBITAILLEis a 66y.o. [place]. He was noted to have a slowly declining platelet counts since 2013without any additional symptoms attributed to the change. At this time, patient's main problem is lower back pain for which she is taking chronic opioid medications. The pain is highly disruptive to his activities of daily living. He is unable to function without pain relievers., He has tried injections, but they have not been having any significant benefit. Currently, patient is planning on seeing a neurosurgeon for possible surgical correction. Additionally, patient describes  significant problems with urination that he attributes to enlarged prostate. The symptoms include increased straining, slow urine flow, and then completed voiding. Patient denies any pain, hematuria. He also noticed that he is urine became more foamy lately. He denies any change in order or color of the urine.  Patient denies any fevers, chills, night sweats. No unexpected weight loss. He continues to work part time approximately 20 hours a week. He is able to get through his work today, but does get tired after 6 hours. Denies any oral sores, skin rash, swollen lymph nodes, easy bruisability, pathological bleeding.   Patient returns to the clinic today to review the results of our evaluation. In the interim, he has had no new symptoms.   Oncological/hematological History: --Labs, 07/18/08: WBC 12.4, Hgb 15.0, Plt 146 --Labs, 06/05/11: WBC 8.8, Hgb 13.9, Plt 118 --Labs, 11/19/13: WBC 9.2, Hgb 14.9, Plt 114;   --Labs, 03/24/15: WBC 7.3, Hgb 15.8, Plt 118, MPV 8.9 --Labs, 04/04/16: WBC 8.0, Hgb 16.7, Plt 119, MPV 8.9 --Labs, 10/04/16: WBC 8.3, Hgb 16.8, Plt 109, MPV 10.0 --Labs, 11/11/16: WBC 6.4, Hgb 16.4, Plt 105, MPV ...; pBlood smear -- variant lymphocytes, large platelets, few teardrop cells and ovalocytes; tBili 1.4, ANA -- negative; --[place], 12/06/16:  No splenomegaly, homogeneous echo texture. Spleen measuring 11.8x4.4x13.8cm, vol 3363m   Medical History: Past Medical History:  Diagnosis Date  . Anxiety   . CAD (coronary artery disease), native coronary artery 06/04/2011   s/p MI with PCI to left circ  . Chronic lower back pain   . DDD (degenerative disc disease), lumbar   . Depression   . High cholesterol   . Hypertension   . Irregular heart beats   . Kidney  stones ~ 1975  . MI (myocardial infarction) ([place]) 05/2011  . OSA on CPAP     Surgical History: Past Surgical History:  Procedure Laterality Date  . CORONARY ANGIOPLASTY WITH STENT PLACEMENT  05/2011   "1"  . CYST  EXCISION Right ~ 2009   "index finger; in office"  . CYSTOSCOPY W/ STONE MANIPULATION  ~ 1975 X 2  . LEFT HEART CATHETERIZATION WITH CORONARY ANGIOGRAM N/A 06/04/2011   Procedure: LEFT HEART CATHETERIZATION WITH CORONARY ANGIOGRAM;  Surgeon: Jacolyn Reedy, MD;  Location[facility] C[facility] LAB;  Service: Cardiovascular;  Laterality: N/A;  . LEFT HEART CATHETERIZATION WITH CORONARY ANGIOGRAM N/A 11/18/2013   Procedure: LEFT HEART CATHETERIZATION WITH CORONARY ANGIOGRAM;  Surgeon: Sinclair Grooms, MD;  Location: [facility] CATH LAB;  Service: Cardiovascular;  Laterality: N/A;  . PERCUTANEOUS CORONARY STENT INTERVENTION (PCI-S)  11/18/2013   Procedure: PERCUTANEOUS CORONARY STENT INTERVENTION (PCI-S);  Surgeon: Sinclair Grooms, MD;  Location: [facility] CATH LAB;  Service: Cardiovascular;;    Family History: Family History  Problem Relation Age of Onset  . Coronary artery disease Father 72  . Coronary artery disease Brother 59  . Non-Hodgkin's lymphoma Mother        Had a "spleen cancer" and died from it when the patient was 66yo  . Kidney cancer Brother   . Lupus Daughter     Social History: Social History   Social History  . Marital status: Married    Spouse name: N/A  . Number of children: N/A  . Years of education: N/A   Occupational History  . Not on file.   Social History Main Topics  . Smoking status: Never Smoker  . Smokeless tobacco: Never Used  . Alcohol use 1.2 oz/week    2 Glasses of wine per week  . Drug use: No  . Sexual activity: Yes   Other Topics Concern  . Not on file   Social History Narrative  . No narrative on file    Allergies: No Known Allergies  Medications:  Current Outpatient Prescriptions  Medication Sig Dispense Refill  . ALPRAZolam (XANAX) 0.5 MG tablet Take 0.5 mg by mouth 2 (two) times daily as needed for anxiety.    Marland Kitchen amLODipine (NORVASC) 10 MG tablet Take 10 mg by mouth daily.  12  . aspirin EC 81 MG tablet Take 81 mg by mouth daily.    . clopidogrel  (PLAVIX) 75 MG tablet Take 75 mg by mouth daily.    Marland Kitchen losartan-hydrochlorothiazide (HYZAAR) 100-25 MG per tablet Take 1 tablet by mouth daily.    Marland Kitchen oxyCODONE (OXY IR/ROXICODONE) 5 MG immediate release tablet TAKE 1/2-1 TABLET BY MOUTH EVERY 6-8 HOURS AS NEEDED    . OxyCODONE ER (XTAMPZA ER) 9 MG C12A Take 9 mg by mouth 2 (two) times daily.    Marland Kitchen zolpidem (AMBIEN) 10 MG tablet Take 10 mg by mouth at bedtime.    Marland Kitchen atorvastatin (LIPITOR) 80 MG tablet Take 1 tablet (80 mg total) by mouth daily at 6 PM. 30 tablet 12  . nitroGLYCERIN (NITROSTAT) 0.4 MG SL tablet Place 1 tablet (0.4 mg total) under the tongue every 5 (five) minutes x 3 doses as needed for chest pain. 25 tablet 4   No current facility-administered medications for this visit.     Review of Systems: Review of Systems  Constitutional: Negative.   HENT:  Negative.   Eyes: Negative.   Respiratory: Negative.   Cardiovascular: Negative.   Gastrointestinal: Negative.   Endocrine:  Negative.   Genitourinary: Positive for difficulty urinating, frequency and nocturia. Negative for bladder incontinence, dysuria and hematuria.   Musculoskeletal: Positive for back pain. Negative for arthralgias, flank pain, myalgias, neck pain and neck stiffness.  Skin: Negative.   Neurological: Negative.   Hematological: Negative.   Psychiatric/Behavioral: Negative.      PHYSICAL EXAMINATION Blood pressure 131/62, pulse (!) 58, temperature 98 F (36.7 C), temperature source Oral, resp. rate 18, height 5' 6.25" (1.683 m), weight 183 lb 3.2 oz (83.1 kg), SpO2 98 %.  ECOG PERFORMANCE STATUS: 1 - Symptomatic but completely ambulatory  Physical Exam  Constitutional: He is oriented to person, place, and time and well-developed, well-nourished, and in no distress. No distress.  HENT:  Head: Normocephalic and atraumatic.  Mouth/Throat: Oropharynx is clear and moist. No oropharyngeal exudate.  Eyes: Pupils are equal, round, and reactive to light.  Conjunctivae and EOM are normal. No scleral icterus.  Neck: Normal range of motion. No thyromegaly present.  Cardiovascular: Normal rate, regular rhythm and normal heart sounds.   No murmur heard. Pulmonary/Chest: Effort normal and breath sounds normal. No respiratory distress. He has no rales.  Abdominal: Soft. Bowel sounds are normal. He exhibits no distension and no mass. There is no hepatosplenomegaly. There is no tenderness.  Musculoskeletal: Normal range of motion. He exhibits no edema or tenderness.  Lymphadenopathy:       Head (right side): No submandibular and no occipital adenopathy present.       Head (left side): No submandibular and no occipital adenopathy present.    He has no cervical adenopathy.    He has no axillary adenopathy.       Right: No inguinal and no supraclavicular adenopathy present.       Left: No inguinal and no supraclavicular adenopathy present.  Neurological: He is alert and oriented to person, place, and time. He has normal reflexes. No cranial nerve deficit.  Skin: Skin is warm and dry. He is not diaphoretic.  Facial erythema with a patch over the medial forehead and bilateral nasolabial fold redness, pattern consistent with CPAP mask attachment  Nursing note and vitals reviewed.    LABORATORY DATA: I have personally reviewed the data as listed: No visits with results within 1 Week(s) from this visit.  Latest known visit with results is:  Appointment on 11/11/2016  Component Date Value Ref Range Status  . WBC 11/11/2016 6.4  4.0 - 10.3 10e3/uL Final  . NEUT# 11/11/2016 3.6  1.5 - 6.5 10e3/uL Final  . HGB 11/11/2016 16.4  13.0 - 17.1 g/dL Final  . HCT 11/11/2016 45.9  38.4 - 49.9 % Final  . Platelets 11/11/2016 105* 140 - 400 10e3/uL Final  . MCV 11/11/2016 88.6  79.3 - 98.0 fL Final  . MCH 11/11/2016 31.7  27.2 - 33.4 pg Final  . MCHC 11/11/2016 35.7  32.0 - 36.0 g/dL Final  . RBC 11/11/2016 5.18  4.20 - 5.82 10e6/uL Final  . RDW 11/11/2016 13.4   11.0 - 14.6 % Final  . lymph# 11/11/2016 1.8  0.9 - 3.3 10e3/uL Final  . MONO# 11/11/2016 0.5  0.1 - 0.9 10e3/uL Final  . Eosinophils Absolute 11/11/2016 0.4  0.0 - 0.5 10e3/uL Final  . Basophils Absolute 11/11/2016 0.0  0.0 - 0.1 10e3/uL Final  . NEUT% 11/11/2016 56.5  39.0 - 75.0 % Final  . LYMPH% 11/11/2016 28.7  14.0 - 49.0 % Final  . MONO% 11/11/2016 8.5  0.0 - 14.0 % Final  . EOS% 11/11/2016  6.0  0.0 - 7.0 % Final  . BASO% 11/11/2016 0.3  0.0 - 2.0 % Final  . nRBC 11/11/2016 0  0 - 0 % Final  . Retic % 11/11/2016 1.69  0.80 - 1.80 % Final  . Retic Ct Abs 11/11/2016 87.54  34.80 - 93.90 10e3/uL Final  . Immature Retic Fract 11/11/2016 4.50  3.00 - 10.60 % Final  . Polychromasia 11/11/2016 Slight  Slight Final  . Tear Drop Cells 11/11/2016 Few  Negative Final  . Ovalocytes 11/11/2016 Few  Negative Final  . White Cell Comments 11/11/2016 Variant Lymphs   Final  . PLT EST 11/11/2016 Decreased  Adequate Final  . Platelet Morphology 11/11/2016 Large Platelets  Within Normal Limits Final  . Sodium 11/11/2016 143  136 - 145 mEq/L Final  . Potassium 11/11/2016 3.2* 3.5 - 5.1 mEq/L Final  . Chloride 11/11/2016 102  98 - 109 mEq/L Final  . CO2 11/11/2016 33* 22 - 29 mEq/L Final  . Glucose 11/11/2016 84  70 - 140 mg/dl Final   Glucose reference range is for nonfasting patients. Fasting glucose reference range is 70- 100.  Marland Kitchen BUN 11/11/2016 10.5  7.0 - 26.0 mg/dL Final  . Creatinine 11/11/2016 0.9  0.7 - 1.3 mg/dL Final  . Total Bilirubin 11/11/2016 1.37* 0.20 - 1.20 mg/dL Final  . Alkaline Phosphatase 11/11/2016 96  40 - 150 U/L Final  . AST 11/11/2016 28  5 - 34 U/L Final  . ALT 11/11/2016 25  0 - 55 U/L Final  . Total Protein 11/11/2016 7.3  6.4 - 8.3 g/dL Final  . Albumin 11/11/2016 4.3  3.5 - 5.0 g/dL Final  . Calcium 11/11/2016 9.6  8.4 - 10.4 mg/dL Final  . Anion Gap 11/11/2016 8  3 - 11 mEq/L Final  . EGFR 11/11/2016 89* >90 ml/min/1.73 m2 Final   eGFR is calculated using the  CKD-EPI Creatinine Equation (2009)  . LDH 11/11/2016 233  125 - 245 U/L Final  . ANA Titer 1 11/11/2016 Negative   Final   Comment:                                                     Negative   <1:80                                                     Borderline  1:80                                                     Positive   >1:80   . RA Latex Turbid. 11/11/2016 <10.0  0.0 - 13.9 IU/mL Final       Ardath Sax, MD

## 2016-12-16 NOTE — Assessment & Plan Note (Signed)
66 year old male with mild, slowly progressive thrombocytopenia notable on the lab work for at least 5 years and may be longer. No associated pathological bleeding. No signs or symptoms to suggest a lymphoproliferative or myeloproliferative disorder based on available lab work.  Repeat lab work demonstrates reasonably stable platelet count with large platelets present. This suggests either medication side effect, myelodysplasia, or possible chronic ITP. Ultrasound of the spleen demonstrates no significant splenic enlargement. At this time, no intervention would be recommended. For the less, due to progressive thrombocytopenia, HEENT observation is warranted.  Plan: --RTC with us in 3months for clinical monitoring

## 2016-12-23 ENCOUNTER — Encounter: Payer: Self-pay | Admitting: Hematology and Oncology

## 2016-12-31 DIAGNOSIS — M47816 Spondylosis without myelopathy or radiculopathy, lumbar region: Secondary | ICD-10-CM | POA: Diagnosis not present

## 2016-12-31 DIAGNOSIS — M545 Low back pain: Secondary | ICD-10-CM | POA: Diagnosis not present

## 2016-12-31 DIAGNOSIS — M5416 Radiculopathy, lumbar region: Secondary | ICD-10-CM | POA: Diagnosis not present

## 2016-12-31 DIAGNOSIS — I10 Essential (primary) hypertension: Secondary | ICD-10-CM | POA: Diagnosis not present

## 2017-01-14 DIAGNOSIS — M545 Low back pain: Secondary | ICD-10-CM | POA: Diagnosis not present

## 2017-01-14 DIAGNOSIS — M5412 Radiculopathy, cervical region: Secondary | ICD-10-CM | POA: Diagnosis not present

## 2017-01-14 DIAGNOSIS — I10 Essential (primary) hypertension: Secondary | ICD-10-CM | POA: Diagnosis not present

## 2017-01-14 DIAGNOSIS — M5136 Other intervertebral disc degeneration, lumbar region: Secondary | ICD-10-CM | POA: Diagnosis not present

## 2017-02-03 DIAGNOSIS — R350 Frequency of micturition: Secondary | ICD-10-CM | POA: Diagnosis not present

## 2017-02-03 DIAGNOSIS — N401 Enlarged prostate with lower urinary tract symptoms: Secondary | ICD-10-CM | POA: Diagnosis not present

## 2017-02-03 DIAGNOSIS — N5201 Erectile dysfunction due to arterial insufficiency: Secondary | ICD-10-CM | POA: Diagnosis not present

## 2017-02-03 DIAGNOSIS — R3912 Poor urinary stream: Secondary | ICD-10-CM | POA: Diagnosis not present

## 2017-02-10 ENCOUNTER — Other Ambulatory Visit: Payer: Self-pay | Admitting: Neurosurgery

## 2017-02-10 DIAGNOSIS — M545 Low back pain: Secondary | ICD-10-CM

## 2017-02-10 DIAGNOSIS — G8929 Other chronic pain: Secondary | ICD-10-CM

## 2017-02-10 DIAGNOSIS — M5412 Radiculopathy, cervical region: Secondary | ICD-10-CM

## 2017-03-13 ENCOUNTER — Encounter: Payer: Self-pay | Admitting: Hematology and Oncology

## 2017-03-13 ENCOUNTER — Other Ambulatory Visit (HOSPITAL_BASED_OUTPATIENT_CLINIC_OR_DEPARTMENT_OTHER): Payer: Medicare Other

## 2017-03-13 ENCOUNTER — Ambulatory Visit (HOSPITAL_BASED_OUTPATIENT_CLINIC_OR_DEPARTMENT_OTHER): Payer: Medicare Other | Admitting: Hematology and Oncology

## 2017-03-13 VITALS — BP 121/67 | HR 52 | Temp 98.9°F | Resp 18 | Ht 66.25 in | Wt 176.6 lb

## 2017-03-13 DIAGNOSIS — D696 Thrombocytopenia, unspecified: Secondary | ICD-10-CM

## 2017-03-13 DIAGNOSIS — R39198 Other difficulties with micturition: Secondary | ICD-10-CM

## 2017-03-13 DIAGNOSIS — R351 Nocturia: Secondary | ICD-10-CM | POA: Diagnosis not present

## 2017-03-13 DIAGNOSIS — M545 Low back pain: Secondary | ICD-10-CM

## 2017-03-13 DIAGNOSIS — R350 Frequency of micturition: Secondary | ICD-10-CM

## 2017-03-13 LAB — CBC WITH DIFFERENTIAL/PLATELET
BASO%: 0.7 % (ref 0.0–2.0)
Basophils Absolute: 0.1 10*3/uL (ref 0.0–0.1)
EOS ABS: 0.6 10*3/uL — AB (ref 0.0–0.5)
EOS%: 7.7 % — ABNORMAL HIGH (ref 0.0–7.0)
HCT: 42.4 % (ref 38.4–49.9)
HGB: 14.5 g/dL (ref 13.0–17.1)
LYMPH%: 23.8 % (ref 14.0–49.0)
MCH: 30.2 pg (ref 27.2–33.4)
MCHC: 34.2 g/dL (ref 32.0–36.0)
MCV: 88.5 fL (ref 79.3–98.0)
MONO#: 0.8 10*3/uL (ref 0.1–0.9)
MONO%: 10.4 % (ref 0.0–14.0)
NEUT#: 4.6 10*3/uL (ref 1.5–6.5)
NEUT%: 57.4 % (ref 39.0–75.0)
Platelets: 115 10*3/uL — ABNORMAL LOW (ref 140–400)
RBC: 4.79 10*6/uL (ref 4.20–5.82)
RDW: 14 % (ref 11.0–14.6)
WBC: 8 10*3/uL (ref 4.0–10.3)
lymph#: 1.9 10*3/uL (ref 0.9–3.3)

## 2017-03-27 DIAGNOSIS — I10 Essential (primary) hypertension: Secondary | ICD-10-CM | POA: Diagnosis not present

## 2017-03-27 DIAGNOSIS — M5136 Other intervertebral disc degeneration, lumbar region: Secondary | ICD-10-CM | POA: Diagnosis not present

## 2017-03-27 DIAGNOSIS — M5412 Radiculopathy, cervical region: Secondary | ICD-10-CM | POA: Diagnosis not present

## 2017-03-27 DIAGNOSIS — M545 Low back pain: Secondary | ICD-10-CM | POA: Diagnosis not present

## 2017-04-07 DIAGNOSIS — D696 Thrombocytopenia, unspecified: Secondary | ICD-10-CM | POA: Diagnosis not present

## 2017-04-07 DIAGNOSIS — M519 Unspecified thoracic, thoracolumbar and lumbosacral intervertebral disc disorder: Secondary | ICD-10-CM | POA: Diagnosis not present

## 2017-04-07 DIAGNOSIS — G4700 Insomnia, unspecified: Secondary | ICD-10-CM | POA: Diagnosis not present

## 2017-04-07 DIAGNOSIS — F419 Anxiety disorder, unspecified: Secondary | ICD-10-CM | POA: Diagnosis not present

## 2017-04-12 NOTE — Progress Notes (Signed)
[facility] Follow-up Visit:  Assessment: Thrombocytopenia (HCC) 67 year old male with mild, slowly progressive thrombocytopenia notable on the lab work for at least 5 years and may be longer. No associated pathological bleeding. No signs or symptoms to suggest a lymphoproliferative or myeloproliferative disorder based on available lab work.  Repeat lab work demonstrates reasonably stable platelet count with large platelets present. This suggests either medication side effect, myelodysplasia, or possible chronic ITP. Ultrasound of the spleen demonstrates no significant splenic enlargement. At this time, no intervention would be recommended.   Plan: --Recommend follow-up by primary care provider with blood counts obtained every 3-6 months to monitor for progression.  If any of the hematological parameters drop significantly, we will be happy to see patient back in our clinic.  Voice recognition software was used and creation of this note. Despite my best effort at editing the text, some misspelling/errors may have occurred.  No orders of the defined types were placed in this encounter.   All questions were answered.  . The patient knows to call the clinic with any problems, questions or concerns.  This note was electronically signed.    History of Presenting Illness Terry Cook is a 67 y.o. male, [facility]. He was noted to have a slowly declining platelet counts since 2013without any additional symptoms attributed to the change. At this time, patient's main problem is lower back pain for which she is taking chronic opioid medications. The pain is highly disruptive to his activities of daily living. He is unable to function without pain relievers., He has tried injections, but they have not been having any significant benefit. Currently, patient is planning on seeing a neurosurgeon for possible surgical correction. Additionally,  patient describes significant problems with urination that he attributes to enlarged prostate. The symptoms include increased straining, slow urine flow, and then completed voiding. Patient denies any pain, hematuria. He also noticed that he is urine became more foamy lately. He denies any change in order or color of the urine.  Patient denies any fevers, chills, night sweats. No unexpected weight loss. He continues to work part time approximately 20 hours a week. He is able to get through his work today, but does get tired after 6 hours. Denies any oral sores, skin rash, swollen lymph nodes, easy bruisability, pathological bleeding.   Patient returns to the clinic today to review the results of our evaluation. In the interim, he has had no new symptoms.   Oncological/hematological History: --Labs, 07/18/08: WBC 12.4, Hgb 15.0, Plt 146 --Labs, 06/05/11: WBC 8.8, Hgb 13.9, Plt 118 --Labs, 11/19/13: WBC 9.2, Hgb 14.9, Plt 114;   --Labs, 03/24/15: WBC 7.3, Hgb 15.8, Plt 118, MPV 8.9 --Labs, 04/04/16: WBC 8.0, Hgb 16.7, Plt 119, MPV 8.9 --Labs, 10/04/16: WBC 8.3, Hgb 16.8, Plt 109, MPV 10.0 --Labs, 11/11/16: WBC 6.4, Hgb 16.4, Plt 105, MPV ...; pBlood smear -- variant lymphocytes, large platelets, few teardrop cells and ovalocytes; tBili 1.4, ANA -- negative; --[place] Abd, 12/06/16:  No splenomegaly, homogeneous echo texture. Spleen measuring 11.8x4.4x13.8cm, vol --Labs, 03/13/17: WBC 8.0, Hgb 14.5, Plt 115;    Medical History: Past Medical History:  Diagnosis Date  . Anxiety   . CAD (coronary artery disease), native coronary artery 06/04/2011   s/p MI with PCI to left circ  . Chronic lower back pain   . DDD (degenerative disc disease), lumbar   . Depression   . High cholesterol   . Hypertension   .  Irregular heart beats   . Kidney stones ~ 1975  . MI (myocardial infarction) ([facility]) 05/2011  . OSA on CPAP     Surgical History: Past Surgical History:  Procedure Laterality Date  .  CORONARY ANGIOPLASTY WITH STENT PLACEMENT  05/2011   "1"  . CYST EXCISION Right ~ 2009   "index finger; in office"  . CYSTOSCOPY W/ STONE MANIPULATION  ~ 1975 X 2  . LEFT HEART CATHETERIZATION WITH CORONARY ANGIOGRAM N/A 06/04/2011   Procedure: LEFT HEART CATHETERIZATION WITH CORONARY ANGIOGRAM;  Surgeon: Othella Boyer, MD;  Location: [facility] CATH LAB;  Service: Cardiovascular;  Laterality: N/A;  . LEFT HEART CATHETERIZATION WITH CORONARY ANGIOGRAM N/A 11/18/2013   Procedure: LEFT HEART CATHETERIZATION WITH CORONARY ANGIOGRAM;  Surgeon: Lesleigh Noe, MD;  Location: [facility] CATH LAB;  Service: Cardiovascular;  Laterality: N/A;  . PERCUTANEOUS CORONARY STENT INTERVENTION (PCI-S)  11/18/2013   Procedure: PERCUTANEOUS CORONARY STENT INTERVENTION (PCI-S);  Surgeon: Lesleigh Noe, MD;  Location: [facility] CATH LAB;  Service: Cardiovascular;;    Family History: Family History  Problem Relation Age of Onset  . Coronary artery disease Father 76  . Coronary artery disease Brother 64  . Non-Hodgkin's lymphoma Mother        Had a "spleen cancer" and died from it when the patient was 67yo  . Kidney cancer Brother   . Lupus Daughter     Social History: Social History   Socioeconomic History  . Marital status: Married    Spouse name: Not on file  . Number of children: Not on file  . Years of education: Not on file  . Highest education level: Not on file  Social Needs  . Financial resource strain: Not on file  . Food insecurity - worry: Not on file  . Food insecurity - inability: Not on file  . Transportation needs - medical: Not on file  . Transportation needs - non-medical: Not on file  Occupational History  . Not on file  Tobacco Use  . Smoking status: Never Smoker  . Smokeless tobacco: Never Used  Substance and Sexual Activity  . Alcohol use: Yes    Alcohol/week: 1.2 oz    Types: 2 Glasses of wine per week  . Drug use: No  . Sexual activity: Yes  Other Topics Concern  . Not on file   Social History Narrative  . Not on file    Allergies: No Known Allergies  Medications:  Current Outpatient Medications  Medication Sig Dispense Refill  . alfuzosin (UROXATRAL) 10 MG 24 hr tablet Take 10 mg daily with breakfast by mouth.    . ALPRAZolam (XANAX) 0.5 MG tablet Take 0.5 mg by mouth 2 (two) times daily as needed for anxiety.    Marland Kitchen amLODipine (NORVASC) 10 MG tablet Take 10 mg by mouth daily.  12  . aspirin EC 81 MG tablet Take 81 mg by mouth daily.    Marland Kitchen atorvastatin (LIPITOR) 80 MG tablet Take 80 mg daily by mouth.    . clopidogrel (PLAVIX) 75 MG tablet Take 75 mg by mouth daily.    Marland Kitchen losartan-hydrochlorothiazide (HYZAAR) 100-25 MG per tablet Take 1 tablet by mouth daily.    Marland Kitchen oxyCODONE (OXY IR/ROXICODONE) 5 MG immediate release tablet TAKE 1/2-1 TABLET BY MOUTH EVERY 6-8 HOURS AS NEEDED    . OxyCODONE ER (XTAMPZA ER) 9 MG C12A Take 9 mg by mouth 2 (two) times daily.    Marland Kitchen zolpidem (AMBIEN) 10 MG tablet Take 10 mg  by mouth at bedtime.    Marland Kitchen. atorvastatin (LIPITOR) 80 MG tablet Take 1 tablet (80 mg total) by mouth daily at 6 PM. 30 tablet 12  . nitroGLYCERIN (NITROSTAT) 0.4 MG SL tablet Place 1 tablet (0.4 mg total) under the tongue every 5 (five) minutes x 3 doses as needed for chest pain. 25 tablet 4   No current facility-administered medications for this visit.     Review of Systems: Review of Systems  Constitutional: Negative.   HENT:  Negative.   Eyes: Negative.   Respiratory: Negative.   Cardiovascular: Negative.   Gastrointestinal: Negative.   Endocrine: Negative.   Genitourinary: Positive for difficulty urinating, frequency and nocturia. Negative for bladder incontinence, dysuria and hematuria.   Musculoskeletal: Positive for back pain. Negative for arthralgias, flank pain, myalgias, neck pain and neck stiffness.  Skin: Negative.   Neurological: Negative.   Hematological: Negative.   Psychiatric/Behavioral: Negative.      PHYSICAL EXAMINATION Blood  pressure 121/67, pulse (!) 52, temperature 98.9 F (37.2 C), temperature source Oral, resp. rate 18, height 5' 6.25" (1.683 m), weight 176 lb 9.6 oz (80.1 kg), SpO2 100 %.  ECOG PERFORMANCE STATUS: 1 - Symptomatic but completely ambulatory  Physical Exam  Constitutional: He is oriented to person, place, and time and well-developed, well-nourished, and in no distress. No distress.  HENT:  Head: Normocephalic and atraumatic.  Mouth/Throat: Oropharynx is clear and moist. No oropharyngeal exudate.  Eyes: Conjunctivae and EOM are normal. Pupils are equal, round, and reactive to light. No scleral icterus.  Neck: Normal range of motion. No thyromegaly present.  Cardiovascular: Normal rate, regular rhythm and normal heart sounds.  No murmur heard. Pulmonary/Chest: Effort normal and breath sounds normal. No respiratory distress. He has no rales.  Abdominal: Soft. Bowel sounds are normal. He exhibits no distension and no mass. There is no hepatosplenomegaly. There is no tenderness.  Musculoskeletal: Normal range of motion. He exhibits no edema or tenderness.  Lymphadenopathy:       Head (right side): No submandibular and no occipital adenopathy present.       Head (left side): No submandibular and no occipital adenopathy present.    He has no cervical adenopathy.    He has no axillary adenopathy.       Right: No inguinal and no supraclavicular adenopathy present.       Left: No inguinal and no supraclavicular adenopathy present.  Neurological: He is alert and oriented to person, place, and time. He has normal reflexes. No cranial nerve deficit.  Skin: Skin is warm and dry. He is not diaphoretic.  Facial erythema with a patch over the medial forehead and bilateral nasolabial fold redness, pattern consistent with CPAP mask attachment  Nursing note and vitals reviewed.    LABORATORY DATA: I have personally reviewed the data as listed: Appointment on 03/13/2017  Component Date Value Ref Range  Status  . WBC 03/13/2017 8.0  4.0 - 10.3 10e3/uL Final  . NEUT# 03/13/2017 4.6  1.5 - 6.5 10e3/uL Final  . HGB 03/13/2017 14.5  13.0 - 17.1 g/dL Final  . HCT 16/10/960412/09/2016 42.4  38.4 - 49.9 % Final  . Platelets 03/13/2017 115* 140 - 400 10e3/uL Final  . MCV 03/13/2017 88.5  79.3 - 98.0 fL Final  . MCH 03/13/2017 30.2  27.2 - 33.4 pg Final  . MCHC 03/13/2017 34.2  32.0 - 36.0 g/dL Final  . RBC 54/09/811912/09/2016 4.79  4.20 - 5.82 10e6/uL Final  . RDW 03/13/2017 14.0  11.0 -  14.6 % Final  . lymph# 03/13/2017 1.9  0.9 - 3.3 10e3/uL Final  . MONO# 03/13/2017 0.8  0.1 - 0.9 10e3/uL Final  . Eosinophils Absolute 03/13/2017 0.6* 0.0 - 0.5 10e3/uL Final  . Basophils Absolute 03/13/2017 0.1  0.0 - 0.1 10e3/uL Final  . NEUT% 03/13/2017 57.4  39.0 - 75.0 % Final  . LYMPH% 03/13/2017 23.8  14.0 - 49.0 % Final  . MONO% 03/13/2017 10.4  0.0 - 14.0 % Final  . EOS% 03/13/2017 7.7* 0.0 - 7.0 % Final  . BASO% 03/13/2017 0.7  0.0 - 2.0 % Final       Daisy Blossom, MD

## 2017-04-12 NOTE — Assessment & Plan Note (Signed)
67 year old male with mild, slowly progressive thrombocytopenia notable on the lab work for at least 5 years and may be longer. No associated pathological bleeding. No signs or symptoms to suggest a lymphoproliferative or myeloproliferative disorder based on available lab work.  Repeat lab work demonstrates reasonably stable platelet count with large platelets present. This suggests either medication side effect, myelodysplasia, or possible chronic ITP. Ultrasound of the spleen demonstrates no significant splenic enlargement. At this time, no intervention would be recommended.   Plan: --Recommend follow-up by primary care provider with blood counts obtained every 3-6 months to monitor for progression.  If any of the hematological parameters drop significantly, we will be happy to see patient back in our clinic.

## 2017-05-12 ENCOUNTER — Inpatient Hospital Stay
Admission: RE | Admit: 2017-05-12 | Discharge: 2017-05-12 | Disposition: A | Discharge status: Home / Self Care | Payer: Medicare Other | Source: Ambulatory Visit | Attending: Neurosurgery | Admitting: Neurosurgery

## 2017-05-12 ENCOUNTER — Other Ambulatory Visit: Payer: Medicare Other

## 2017-05-12 NOTE — Discharge Instructions (Signed)

## 2017-05-26 ENCOUNTER — Ambulatory Visit: Payer: Self-pay | Admitting: Podiatry

## 2017-05-29 ENCOUNTER — Other Ambulatory Visit: Payer: Self-pay | Admitting: Podiatry

## 2017-05-29 ENCOUNTER — Ambulatory Visit: Payer: Medicare Other | Admitting: Podiatry

## 2017-05-29 ENCOUNTER — Encounter: Payer: Self-pay | Admitting: Podiatry

## 2017-05-29 ENCOUNTER — Ambulatory Visit: Payer: Self-pay

## 2017-05-29 ENCOUNTER — Ambulatory Visit (INDEPENDENT_AMBULATORY_CARE_PROVIDER_SITE_OTHER): Payer: Medicare Other

## 2017-05-29 VITALS — BP 140/63 | HR 61

## 2017-05-29 DIAGNOSIS — M19072 Primary osteoarthritis, left ankle and foot: Principal | ICD-10-CM

## 2017-05-29 DIAGNOSIS — M19071 Primary osteoarthritis, right ankle and foot: Secondary | ICD-10-CM

## 2017-05-29 DIAGNOSIS — L97521 Non-pressure chronic ulcer of other part of left foot limited to breakdown of skin: Secondary | ICD-10-CM

## 2017-05-29 DIAGNOSIS — M129 Arthropathy, unspecified: Secondary | ICD-10-CM | POA: Diagnosis not present

## 2017-05-29 DIAGNOSIS — R52 Pain, unspecified: Secondary | ICD-10-CM

## 2017-05-29 MED ORDER — MELOXICAM 15 MG PO TABS: 15.0000 mg | ORAL_TABLET | Freq: Every day | ORAL | 0 refills | Status: DC

## 2017-05-29 NOTE — Progress Notes (Signed)
   Subjective:    Patient ID: Terry Cook, male    DOB: October 18, 1950, 67 y.o.   MRN: [id number]  HPIthis patient presents to the office stating that he is having pain on the top of both feet.  He says he works 8 hours during the course of the day and then when he returns home He has difficulty walking.  He says he acquired a new job 7 months ago and his pain. He has been experiencing has been present for 6 months.  He says he has self treated himself with oxycodone and only but no improvement is noted.  He has tried various inserts but his feet continue to be painful.  He is even tried soaking his foot in hot water with minimal improvement.  He points to an area on the top of his right midfoot as the site of maximum pain.  He has provided no self treatment aside from the above nor professional help.  He presents the office today for an evaluation and treatment.  Patient is taking Plavix.      Review of Systems  All other systems reviewed and are negative.      Objective:   Physical Exam General Appearance  Alert, conversant and in no acute stress.  Vascular  Dorsalis pedis and posterior pulses are palpable  bilaterally.  Capillary return is within normal limits  bilaterally. Temperature is within normal limits  Bilaterally.  Neurologic  Senn-Weinstein monofilament wire test within normal limits  bilaterally. Muscle power within normal limits bilaterally.  Nails ormal nails noted with no evidence of fungal or bacterial infection.  Orthopedic  No limitations of motion of motion feet bilaterally.  No crepitus or effusions noted.  Swelling and palpable pain noted at the level of the [employer] Frank's right foot greater than the last.  Arthritic changes are felt at the [employer] Frank's joint  Skin  normotropic skin with no porokeratosis noted bilaterally.  No signs of infections or ulcers noted.          Assessment & Plan:  Chronic arthritis Liz-Frank right foot greater than left.   IE.  X-rays  reveal a generalized arthritis noted at the [facility]'s joint of the right foot. Arthritic changes noted at the level of the first MPJ right and at the base of the second and third metatarsal right. Xrays lateral left xray reveal calcification at insertion plantar fascia.  Explained to this patient he has developed a chronic arthritis in the midfoot bilaterally.  Patient was told to continue wearing his insoles.  He was also given a prescription for Mobic to be taken orally for 10 days.  I will consult with Terry NobleRick and get his suggestion for the chronic arthritis.  Patient is to return to the office in 10 days for further evaluation and treatment.   Terry Cook DPM

## 2017-05-30 ENCOUNTER — Telehealth: Payer: Self-pay | Admitting: *Deleted

## 2017-05-30 NOTE — Telephone Encounter (Signed)
Dr. Stacie AcresMayer states he spoke with Ria Clockick Puckett, Pedorthist and pt needs an appt with Pedorthist and needs to keep appt with Dr. Stacie AcresMayer. Left message informing pt of Dr. Stacie AcresMayer orders.

## 2017-06-02 ENCOUNTER — Ambulatory Visit: Payer: Self-pay | Admitting: Podiatry

## 2017-06-03 ENCOUNTER — Ambulatory Visit
Admission: RE | Admit: 2017-06-03 | Discharge: 2017-06-03 | Disposition: A | Discharge status: Home / Self Care | Payer: Medicare Other | Source: Ambulatory Visit | Attending: Neurosurgery | Admitting: Neurosurgery

## 2017-06-03 DIAGNOSIS — G8929 Other chronic pain: Secondary | ICD-10-CM

## 2017-06-03 DIAGNOSIS — M5412 Radiculopathy, cervical region: Secondary | ICD-10-CM

## 2017-06-03 DIAGNOSIS — M4802 Spinal stenosis, cervical region: Secondary | ICD-10-CM | POA: Diagnosis not present

## 2017-06-03 DIAGNOSIS — M545 Low back pain: Secondary | ICD-10-CM

## 2017-06-03 DIAGNOSIS — M48061 Spinal stenosis, lumbar region without neurogenic claudication: Secondary | ICD-10-CM | POA: Diagnosis not present

## 2017-06-03 MED ORDER — DIAZEPAM 5 MG PO TABS
5.0000 mg | ORAL_TABLET | Freq: Once | ORAL | Status: AC
  Administered 2017-06-03: 5 mg via ORAL

## 2017-06-03 MED ORDER — ONDANSETRON HCL 4 MG/2ML IJ SOLN
4.0000 mg | Freq: Once | INTRAMUSCULAR | Status: AC
  Administered 2017-06-03: 4 mg via INTRAMUSCULAR

## 2017-06-03 MED ORDER — IOPAMIDOL (ISOVUE-M 300) INJECTION 61%
10.0000 mL | Freq: Once | INTRAMUSCULAR | Status: DC | PRN
Start: 2017-06-03 — End: 2017-06-04

## 2017-06-03 MED ORDER — MEPERIDINE HCL 100 MG/ML IJ SOLN
75.0000 mg | Freq: Once | INTRAMUSCULAR | Status: AC
  Administered 2017-06-03: 75 mg via INTRAMUSCULAR

## 2017-06-03 NOTE — Discharge Instructions (Signed)

## 2017-06-10 ENCOUNTER — Telehealth: Payer: Self-pay | Admitting: *Deleted

## 2017-06-10 NOTE — Telephone Encounter (Signed)
Pt states he has called the phone number 2 times for [facility] and has received no call back, and would like to get an appt with Mr. Revonda Humphreyuckett in [place].

## 2017-06-10 NOTE — Telephone Encounter (Signed)
Pt states he does not need a call back he called and set up an appt with Raiford Nobleick in [place].

## 2017-06-12 ENCOUNTER — Encounter: Payer: Self-pay | Admitting: Podiatry

## 2017-06-12 ENCOUNTER — Ambulatory Visit: Payer: Medicare Other | Admitting: Podiatry

## 2017-06-12 DIAGNOSIS — M129 Arthropathy, unspecified: Secondary | ICD-10-CM

## 2017-06-12 MED ORDER — MELOXICAM 7.5 MG PO TABS: 7.5000 mg | ORAL_TABLET | Freq: Two times a day (BID) | ORAL | 2 refills | Status: DC

## 2017-06-12 NOTE — Progress Notes (Signed)
This patient returns to the office for follow up  of a chronic arthritis midfoot both feet.  He was diagnosed as having chronic arthritis [employer] Frank's right foot greater than left.  Patient was told to wear thick soled shoes.  He was also given a prescription of Mobic to be taken by mouth.  He has picked up a pair of insoles that he wears in his shoes.  He says that pain still persists, but the severe pain has resolved.  He says he believes the Mobic has helped control his pain.  He is [facility] to be [facility].  He presents the office today for continued evaluation and treatment.   General Appearance  Alert, conversant and in no acute stress.  Vascular  Dorsalis pedis and posterior tibial  pulses are palpable  bilaterally.  Capillary return is within normal limits  bilaterally. Temperature is within normal limits  bilaterally.  Neurologic  Senn-Weinstein monofilament wire test within normal limits  bilaterally. Muscle power within normal limits bilaterally.  Nails normal nails noted with no evidence of bacterial or fungal infection.  Orthopedic  No limitations of motion of motion feet .  No crepitus or effusions noted.  No bony pathology or digital deformities noted. Patient still has bony  irregularities  along the dorsal aspect of the [employer] Frank's joint, right foot greater than left  Skin  normotropic skin with no porokeratosis noted bilaterally.  No signs of infections or ulcers noted.    Chronic  Arthropathy  ROV.  Discuss his feet with this patient.  He says that the medicine has taken the edge off the pain in his foot.  He is scheduled to keep his appointment with Raiford Nobleick.  Patient to continue wearing better footgear.  He was prescribed Mobic 7.5 mg, #60.  Return to the clinic as needed for pain   Helane GuntherGregory Calley Drenning DPM

## 2017-06-18 DIAGNOSIS — M5030 Other cervical disc degeneration, unspecified cervical region: Secondary | ICD-10-CM | POA: Diagnosis not present

## 2017-06-18 DIAGNOSIS — M5136 Other intervertebral disc degeneration, lumbar region: Secondary | ICD-10-CM | POA: Diagnosis not present

## 2017-06-18 DIAGNOSIS — M542 Cervicalgia: Secondary | ICD-10-CM | POA: Diagnosis not present

## 2017-06-18 DIAGNOSIS — M545 Low back pain: Secondary | ICD-10-CM | POA: Diagnosis not present

## 2017-06-25 ENCOUNTER — Other Ambulatory Visit: Payer: Medicare Other | Admitting: Orthotics

## 2017-07-07 DIAGNOSIS — M5030 Other cervical disc degeneration, unspecified cervical region: Secondary | ICD-10-CM | POA: Diagnosis not present

## 2017-07-07 DIAGNOSIS — M542 Cervicalgia: Secondary | ICD-10-CM | POA: Diagnosis not present

## 2017-07-07 DIAGNOSIS — M5136 Other intervertebral disc degeneration, lumbar region: Secondary | ICD-10-CM | POA: Diagnosis not present

## 2017-07-07 DIAGNOSIS — M545 Low back pain: Secondary | ICD-10-CM | POA: Diagnosis not present

## 2017-08-18 ENCOUNTER — Telehealth: Payer: Self-pay | Admitting: Podiatry

## 2017-08-18 MED ORDER — MELOXICAM 7.5 MG PO TABS: 7.5000 mg | ORAL_TABLET | Freq: Two times a day (BID) | ORAL | 2 refills | Status: DC

## 2017-08-18 NOTE — Telephone Encounter (Signed)
Pt. Would like a refill on Meloxicam

## 2017-08-18 NOTE — Telephone Encounter (Addendum)
Unable to leave a message work line is busy. Left message on mobile line informing pt meloxicam has been filled and sent to the [facility] [address], and he would need an appt prior to future refills.

## 2017-08-18 NOTE — Addendum Note (Signed)
Addended by: Alphia Kava D on: 08/18/2017 01:54 PM   Modules accepted: Orders

## 2017-09-10 DIAGNOSIS — H40023 Open angle with borderline findings, high risk, bilateral: Secondary | ICD-10-CM | POA: Diagnosis not present

## 2017-09-10 DIAGNOSIS — H2513 Age-related nuclear cataract, bilateral: Secondary | ICD-10-CM | POA: Diagnosis not present

## 2017-09-10 DIAGNOSIS — H43811 Vitreous degeneration, right eye: Secondary | ICD-10-CM | POA: Diagnosis not present

## 2017-09-10 DIAGNOSIS — D3132 Benign neoplasm of left choroid: Secondary | ICD-10-CM | POA: Diagnosis not present

## 2017-10-01 DIAGNOSIS — E663 Overweight: Secondary | ICD-10-CM | POA: Diagnosis not present

## 2017-10-01 DIAGNOSIS — E785 Hyperlipidemia, unspecified: Secondary | ICD-10-CM | POA: Diagnosis not present

## 2017-10-01 DIAGNOSIS — I2510 Atherosclerotic heart disease of native coronary artery without angina pectoris: Secondary | ICD-10-CM | POA: Diagnosis not present

## 2017-10-01 DIAGNOSIS — I10 Essential (primary) hypertension: Secondary | ICD-10-CM | POA: Diagnosis not present

## 2017-10-06 DIAGNOSIS — M5030 Other cervical disc degeneration, unspecified cervical region: Secondary | ICD-10-CM | POA: Diagnosis not present

## 2017-10-06 DIAGNOSIS — M545 Low back pain: Secondary | ICD-10-CM | POA: Diagnosis not present

## 2017-10-06 DIAGNOSIS — M542 Cervicalgia: Secondary | ICD-10-CM | POA: Diagnosis not present

## 2017-10-06 DIAGNOSIS — M5136 Other intervertebral disc degeneration, lumbar region: Secondary | ICD-10-CM | POA: Diagnosis not present

## 2017-10-13 DIAGNOSIS — F419 Anxiety disorder, unspecified: Secondary | ICD-10-CM | POA: Diagnosis not present

## 2017-10-13 DIAGNOSIS — I10 Essential (primary) hypertension: Secondary | ICD-10-CM | POA: Diagnosis not present

## 2017-10-13 DIAGNOSIS — E7800 Pure hypercholesterolemia, unspecified: Secondary | ICD-10-CM | POA: Diagnosis not present

## 2017-10-13 DIAGNOSIS — D696 Thrombocytopenia, unspecified: Secondary | ICD-10-CM | POA: Diagnosis not present

## 2017-11-06 DIAGNOSIS — I70213 Atherosclerosis of native arteries of extremities with intermittent claudication, bilateral legs: Secondary | ICD-10-CM | POA: Diagnosis not present

## 2017-11-06 DIAGNOSIS — E7800 Pure hypercholesterolemia, unspecified: Secondary | ICD-10-CM | POA: Diagnosis not present

## 2017-11-06 DIAGNOSIS — I2510 Atherosclerotic heart disease of native coronary artery without angina pectoris: Secondary | ICD-10-CM | POA: Diagnosis not present

## 2017-11-06 DIAGNOSIS — E785 Hyperlipidemia, unspecified: Secondary | ICD-10-CM | POA: Diagnosis not present

## 2017-11-06 DIAGNOSIS — D696 Thrombocytopenia, unspecified: Secondary | ICD-10-CM | POA: Diagnosis not present

## 2017-11-06 DIAGNOSIS — F419 Anxiety disorder, unspecified: Secondary | ICD-10-CM | POA: Diagnosis not present

## 2017-11-06 DIAGNOSIS — I10 Essential (primary) hypertension: Secondary | ICD-10-CM | POA: Diagnosis not present

## 2017-11-13 ENCOUNTER — Telehealth: Payer: Self-pay | Admitting: Podiatry

## 2017-11-13 ENCOUNTER — Other Ambulatory Visit: Payer: Self-pay | Admitting: Podiatry

## 2017-11-13 MED ORDER — MELOXICAM 7.5 MG PO TABS: 7.5000 mg | ORAL_TABLET | Freq: Two times a day (BID) | ORAL | 0 refills | Status: DC

## 2017-11-13 NOTE — Telephone Encounter (Signed)
Needs refill on Meloxicam, pt is scheduled for an appt. On Aug. 19 with Dr. Stacie AcresMayer. Patient would like RX filled at [facility] in [place] on [address]

## 2017-11-13 NOTE — Addendum Note (Signed)
Addended by: Alphia Kava'CONNELL, Aaryan Essman D on: 11/13/2017 04:13 PM   Modules accepted: Orders

## 2017-11-24 ENCOUNTER — Encounter: Payer: Self-pay | Admitting: Podiatry

## 2017-11-24 ENCOUNTER — Ambulatory Visit: Payer: Medicare Other | Admitting: Podiatry

## 2017-11-24 DIAGNOSIS — M129 Arthropathy, unspecified: Secondary | ICD-10-CM | POA: Diagnosis not present

## 2017-11-24 NOTE — Progress Notes (Signed)
This patient returns to the office for follow up  of a chronic arthritis midfoot both feet.  He was diagnosed as having chronic arthritis Al[employer] GuildLiz Frank's right foot greater than left.  Patient states that he was in severe pain at the last visit and never saw Raiford NobleRick  as suggested.  Instead he bought a new pair of shoes from a company named [employer].  He says that between the new shoes and the Mobic  that was prescribed, he says he is now approximately 60% better.  He still has pain but he is much better.  He says he works three days a week and on those days he takes Mobic.He presents to the office to discuss his progress.   General Appearance  Alert, conversant and in no acute stress.  Vascular  Dorsalis pedis and posterior tibial  pulses are palpable  bilaterally.  Capillary return is within normal limits  bilaterally. Temperature is within normal limits  bilaterally.  Neurologic  Senn-Weinstein monofilament wire test within normal limits  bilaterally. Muscle power within normal limits bilaterally.  Nails normal nails noted with no evidence of bacterial or fungal infection.  Orthopedic  No limitations of motion of motion feet .  No crepitus or effusions noted.  No bony pathology or digital deformities noted. Patient still has bony  irregularities  along the dorsal aspect of the [employer] Frank's joint, right foot greater than left.  Hallux limitus 1st MPJ right foot.  Skin  normotropic skin with no porokeratosis noted bilaterally.  No signs of infections or ulcers noted.    Chronic  Arthropathy  ROV.  Discussed  his feet with this patient.  He is very pleased with his shoes that he obtained from [employer] online.  He says he takes his Mobic only 3 times a week when he works.  He does not take his Mobic daily.  This patient also is on Plavix.  He is very pleased with the progress of his feet.  He previously had called the office and we  refilled  his Mobic, which was approved by myself.  He is to continue to make  appointments so I can monitor him for the Mobic usage in the future.   Helane GuntherGregory Karah Caruthers DPM

## 2017-11-25 ENCOUNTER — Other Ambulatory Visit: Payer: Self-pay | Admitting: Cardiology

## 2017-11-25 DIAGNOSIS — I70213 Atherosclerosis of native arteries of extremities with intermittent claudication, bilateral legs: Secondary | ICD-10-CM

## 2017-11-26 ENCOUNTER — Encounter (HOSPITAL_COMMUNITY): Payer: Self-pay

## 2017-11-26 ENCOUNTER — Ambulatory Visit (HOSPITAL_COMMUNITY)
Admission: RE | Admit: 2017-11-26 | Discharge: 2017-11-26 | Disposition: A | Discharge status: Home / Self Care | Payer: Medicare Other | Source: Ambulatory Visit | Attending: Family | Admitting: Family

## 2017-11-26 DIAGNOSIS — I70213 Atherosclerosis of native arteries of extremities with intermittent claudication, bilateral legs: Secondary | ICD-10-CM

## 2017-11-26 DIAGNOSIS — E876 Hypokalemia: Secondary | ICD-10-CM | POA: Diagnosis not present

## 2018-01-01 DIAGNOSIS — M5412 Radiculopathy, cervical region: Secondary | ICD-10-CM | POA: Diagnosis not present

## 2018-01-01 DIAGNOSIS — M542 Cervicalgia: Secondary | ICD-10-CM | POA: Diagnosis not present

## 2018-01-01 DIAGNOSIS — M545 Low back pain: Secondary | ICD-10-CM | POA: Diagnosis not present

## 2018-01-01 DIAGNOSIS — M5416 Radiculopathy, lumbar region: Secondary | ICD-10-CM | POA: Diagnosis not present

## 2018-02-26 ENCOUNTER — Other Ambulatory Visit: Payer: Self-pay | Admitting: Cardiology

## 2018-02-26 MED ORDER — ATORVASTATIN CALCIUM 80 MG PO TABS: 80.0000 mg | ORAL_TABLET | Freq: Every day | ORAL | 0 refills | Status: DC

## 2018-02-26 NOTE — Telephone Encounter (Signed)
[facility] [place] with no refills. Will have Dr. Dulce SellarMunley review Tilley's last office note and most recent testing to see when patient needs to follow up as it is not states in Dr. York Spanielilley's note. Further refills will be sent once follow up time has been decided.

## 2018-02-26 NOTE — Telephone Encounter (Signed)
° ° ° °  1. Which medications need to be refilled? (please list name of each medication and dose if known) Atorvastatin 80mg  tablet  2. Which pharmacy/location (including street and city if local pharmacy) is medication to be sent to[facility] [address] [place], [place]  3. Do they need a 30 day or 90 day supply? 30

## 2018-02-27 MED ORDER — ATORVASTATIN CALCIUM 80 MG PO TABS: 80.0000 mg | ORAL_TABLET | Freq: Every day | ORAL | 6 refills | Status: DC

## 2018-02-27 NOTE — Addendum Note (Signed)
Addended by: Crist FatLOCKHART, [place] P on: 02/27/2018 08:37 AM   Modules accepted: Orders

## 2018-02-27 NOTE — Telephone Encounter (Signed)
Dr. Dulce SellarMunley reviewed records and advised that patient should follow up in June 2020. Refills for atorvastatin have been provided to get him to his follow up.

## 2018-03-16 DIAGNOSIS — M545 Low back pain: Secondary | ICD-10-CM | POA: Diagnosis not present

## 2018-03-16 DIAGNOSIS — M5416 Radiculopathy, lumbar region: Secondary | ICD-10-CM | POA: Diagnosis not present

## 2018-03-16 DIAGNOSIS — Z6829 Body mass index (BMI) 29.0-29.9, adult: Secondary | ICD-10-CM | POA: Diagnosis not present

## 2018-03-16 DIAGNOSIS — I10 Essential (primary) hypertension: Secondary | ICD-10-CM | POA: Diagnosis not present

## 2018-04-16 DIAGNOSIS — E7800 Pure hypercholesterolemia, unspecified: Secondary | ICD-10-CM | POA: Diagnosis not present

## 2018-04-16 DIAGNOSIS — I10 Essential (primary) hypertension: Secondary | ICD-10-CM | POA: Diagnosis not present

## 2018-04-16 DIAGNOSIS — F419 Anxiety disorder, unspecified: Secondary | ICD-10-CM | POA: Diagnosis not present

## 2018-04-16 DIAGNOSIS — D696 Thrombocytopenia, unspecified: Secondary | ICD-10-CM | POA: Diagnosis not present

## 2018-04-20 ENCOUNTER — Telehealth: Payer: Self-pay | Admitting: Podiatry

## 2018-04-20 ENCOUNTER — Other Ambulatory Visit: Payer: Self-pay | Admitting: Podiatry

## 2018-04-20 MED ORDER — MELOXICAM 7.5 MG PO TABS: 7.5000 mg | ORAL_TABLET | Freq: Two times a day (BID) | ORAL | 0 refills | Status: DC

## 2018-04-20 NOTE — Addendum Note (Signed)
Addended by: Geraldine Contras D on: 04/20/2018 11:33 AM   Modules accepted: Orders

## 2018-04-20 NOTE — Telephone Encounter (Signed)
Pt sent in request for refill of Meloxicam through [facility] and would like to have the Pharmacy changed to [facility] @ [address], [place]. [phone]

## 2018-04-20 NOTE — Telephone Encounter (Signed)
Per Dr. Stacie Acres verbal order, ok to refill.   New script has been sent to [facility]

## 2018-06-04 DIAGNOSIS — I10 Essential (primary) hypertension: Secondary | ICD-10-CM | POA: Diagnosis not present

## 2018-06-04 DIAGNOSIS — M47816 Spondylosis without myelopathy or radiculopathy, lumbar region: Secondary | ICD-10-CM | POA: Diagnosis not present

## 2018-06-04 DIAGNOSIS — M5030 Other cervical disc degeneration, unspecified cervical region: Secondary | ICD-10-CM | POA: Diagnosis not present

## 2018-06-04 DIAGNOSIS — M5136 Other intervertebral disc degeneration, lumbar region: Secondary | ICD-10-CM | POA: Diagnosis not present

## 2018-06-17 ENCOUNTER — Encounter: Payer: Self-pay | Admitting: Urology

## 2018-06-17 ENCOUNTER — Ambulatory Visit: Payer: Medicare Other | Admitting: Urology

## 2018-06-17 ENCOUNTER — Other Ambulatory Visit: Payer: Self-pay

## 2018-06-17 VITALS — BP 143/79 | HR 67 | Ht 66.0 in | Wt 174.0 lb

## 2018-06-17 DIAGNOSIS — R3912 Poor urinary stream: Secondary | ICD-10-CM

## 2018-06-17 DIAGNOSIS — N138 Other obstructive and reflux uropathy: Secondary | ICD-10-CM

## 2018-06-17 DIAGNOSIS — N401 Enlarged prostate with lower urinary tract symptoms: Secondary | ICD-10-CM | POA: Diagnosis not present

## 2018-06-17 DIAGNOSIS — M545 Low back pain, unspecified: Secondary | ICD-10-CM

## 2018-06-17 DIAGNOSIS — G8929 Other chronic pain: Secondary | ICD-10-CM

## 2018-06-17 MED ORDER — ALFUZOSIN HCL ER 10 MG PO TB24: 10.0000 mg | ORAL_TABLET | Freq: Every day | ORAL | 3 refills | Status: DC

## 2018-06-17 NOTE — Patient Instructions (Signed)

## 2018-06-17 NOTE — Progress Notes (Signed)
06/17/2018 9:44 AM   Terry Cook [birth year] [id number]  Referring provider: Juluis Rainier, MD [address] [place], [place] [address]  Chief Complaint  Patient presents with  . Follow-up    HPI: 68 yo with h/o weak stream and straining to void x 30 years. Prostate not all that large on prior exam. I saw him in [place] Oct 2018 and PSa was 1.47. His AUASS was 24. He started alfuzosin which helps a lot, but if he misses a dose his stream weakens. He has hesitancy. Weak stream. Needs to double void. Doesn't feel empty. He has no frequency or urgency. No incontinence. AUASS today is 19 (off meds). Noc x 3. He has a history of sleep apnea, back pain and coronary artery disease. No gross hematuria.   NG risk - extensive back surgery/pain. No urologic surgery. Tried tamsulosin remotely and recalled trouble with ED.   Past medical history significant for CAD/PCI/Plavix, nitroglycerin use, CPAP at night. Also history of thrombocytopenia and sees Dr. Gweneth Dimitri. BUN was 10.5 and creatinine 0.14 November 2016. He has severe back pain and takes oycodone daily. His brother had kidney cancer.   Modifying factors: There are no other modifying factors  Associated signs and symptoms: There are no other associated signs and symptoms Aggravating and relieving factors: There are no other aggravating or relieving factors Severity: Moderate Duration: Persistent    PMH: Past Medical History:  Diagnosis Date  . Anxiety   . CAD (coronary artery disease), native coronary artery 06/04/2011   s/p MI with PCI to left circ  . Chronic lower back pain   . DDD (degenerative disc disease), lumbar   . Depression   . High cholesterol   . Hypertension   . Irregular heart beats   . Kidney stones ~ 1975  . MI (myocardial infarction) (HCC) 05/2011  . OSA on CPAP     Surgical History: Past Surgical History:  Procedure Laterality Date  . CORONARY ANGIOPLASTY WITH STENT PLACEMENT  05/2011   "1"  . CYST  EXCISION Right ~ 2009   "index finger; in office"  . CYSTOSCOPY W/ STONE MANIPULATION  ~ 1975 X 2  . LEFT HEART CATHETERIZATION WITH CORONARY ANGIOGRAM N/A 06/04/2011   Procedure: LEFT HEART CATHETERIZATION WITH CORONARY ANGIOGRAM;  Surgeon: Othella Boyer, MD;  Location[facility] C[facility] LAB;  Service: Cardiovascular;  Laterality: N/A;  . LEFT HEART CATHETERIZATION WITH CORONARY ANGIOGRAM N/A 11/18/2013   Procedure: LEFT HEART CATHETERIZATION WITH CORONARY ANGIOGRAM;  Surgeon: Lesleigh Noe, MD;  Location: [facility] CATH LAB;  Service: Cardiovascular;  Laterality: N/A;  . PERCUTANEOUS CORONARY STENT INTERVENTION (PCI-S)  11/18/2013   Procedure: PERCUTANEOUS CORONARY STENT INTERVENTION (PCI-S);  Surgeon: Lesleigh Noe, MD;  Location: [facility] CATH LAB;  Service: Cardiovascular;;    Home Medications:  Allergies as of 06/17/2018   No Known Allergies     Medication List       Accurate as of June 17, 2018  9:44 AM. Always use your most recent med list.        alfuzosin 10 MG 24 hr tablet Commonly known as:  UROXATRAL Take 10 mg daily with breakfast by mouth.   ALPRAZolam 0.5 MG tablet Commonly known as:  XANAX Take 0.5 mg by mouth 2 (two) times daily as needed for anxiety.   amLODipine 10 MG tablet Commonly known as:  NORVASC Take 10 mg by mouth daily.   aspirin EC 81 MG tablet Take 81 mg by mouth daily.  atorvastatin 80 MG tablet Commonly known as:  LIPITOR Take 1 tablet (80 mg total) by mouth daily.   clopidogrel 75 MG tablet Commonly known as:  PLAVIX Take 75 mg by mouth daily.   losartan-hydrochlorothiazide 100-25 MG tablet Commonly known as:  HYZAAR Take 1 tablet by mouth daily.   meloxicam 7.5 MG tablet Commonly known as:  MOBIC Take 1 tablet (7.5 mg total) by mouth 2 (two) times daily.   nitroGLYCERIN 0.4 MG SL tablet Commonly known as:  NITROSTAT See admin instructions.   Xtampza ER 9 MG C12a Generic drug:  oxyCODONE ER Take 9 mg by mouth 2 (two) times daily.    zolpidem 10 MG tablet Commonly known as:  AMBIEN Take 10 mg by mouth at bedtime.       Allergies: No Known Allergies  Family History: Family History  Problem Relation Age of Onset  . Coronary artery disease Father 80  . Coronary artery disease Brother 79  . Non-Hodgkin's lymphoma Mother        Had a "spleen cancer" and died from it when the patient was 68yo  . Kidney cancer Brother   . Lupus Daughter     Social History:  reports that he has never smoked. He has never used smokeless tobacco. He reports current alcohol use of about 2.0 standard drinks of alcohol per week. He reports that he does not use drugs.  ROS: UROLOGY Frequent Urination?: No Hard to postpone urination?: No Burning/pain with urination?: No Get up at night to urinate?: No Leakage of urine?: Yes Urine stream starts and stops?: No Trouble starting stream?: Yes Do you have to strain to urinate?: Yes Blood in urine?: No Urinary tract infection?: No Sexually transmitted disease?: No Injury to kidneys or bladder?: No Painful intercourse?: No Weak stream?: No Erection problems?: No Penile pain?: No  Gastrointestinal Nausea?: No Vomiting?: No Indigestion/heartburn?: No Diarrhea?: No Constipation?: No  Constitutional Fever: No Night sweats?: No Weight loss?: No Fatigue?: No  Skin Skin rash/lesions?: No Itching?: No  Eyes Blurred vision?: No Double vision?: No  Ears/Nose/Throat Sore throat?: No Sinus problems?: No  Hematologic/Lymphatic Swollen glands?: No Easy bruising?: No  Cardiovascular Leg swelling?: No Chest pain?: No  Respiratory Cough?: No Shortness of breath?: No  Endocrine Excessive thirst?: No  Musculoskeletal Back pain?: Yes Joint pain?: No  Neurological Headaches?: No Dizziness?: No  Psychologic Depression?: Yes Anxiety?: Yes  Physical Exam: BP (!) 143/79   Pulse 67   Ht 5\' 6"  (1.676 m)   Wt 78.9 kg   BMI 28.08 kg/m   Constitutional:  Alert and  oriented, No acute distress. HEENT: [place] AT, moist mucus membranes.  Trachea midline, no masses. Cardiovascular: No clubbing, cyanosis, or edema. Respiratory: Normal respiratory effort, no increased work of breathing. GI: Abdomen is soft, nontender, nondistended, no abdominal masses GU: No CVA tenderness Lymph: No cervical or inguinal lymphadenopathy. Skin: No rashes, bruises or suspicious lesions. Neurologic: Grossly intact, no focal deficits, moving all 4 extremities. Psychiatric: Normal mood and affect. DRE: 30 g , smooth , no hard area or nodule   Laboratory Data: Lab Results  Component Value Date   WBC 8.0 03/13/2017   HGB 14.5 03/13/2017   HCT 42.4 03/13/2017   MCV 88.5 03/13/2017   PLT 115 (L) 03/13/2017    Lab Results  Component Value Date   CREATININE 0.9 11/11/2016    No results found for: PSA  No results found for: TESTOSTERONE  No results found for: HGBA1C  Urinalysis No  results found for: COLORURINE, APPEARANCEUR, LABSPEC, PHURINE, GLUCOSEU, HGBUR, BILIRUBINUR, KETONESUR, PROTEINUR, UROBILINOGEN, NITRITE, LEUKOCYTESUR  No results found for: LABMICR, WBCUA, RBCUA, LABEPIT, MUCUS, BACTERIA   No results found for this or any previous visit. No results found for this or any previous visit. No results found for this or any previous visit. No results found for this or any previous visit. No results found for this or any previous visit. No results found for this or any previous visit. No results found for this or any previous visit. No results found for this or any previous visit.  Assessment & Plan:    Bph, weak stream - he will continue alfuzosin and we discussed alternatives such as Urolift. Prostate ~ 30 g on exam. PSA sent. F/u for PVR, cysto.   Back pain - check renal US given FH of kidney cancer.   No follow-ups on file.  Jerilee Field, MD  [facility] [address] [place], [place] [address] [phone]

## 2018-06-18 ENCOUNTER — Telehealth: Payer: Self-pay | Admitting: Family Medicine

## 2018-06-18 LAB — PSA: PROSTATE SPECIFIC AG, SERUM: 1.7 ng/mL (ref 0.0–4.0)

## 2018-06-18 NOTE — Telephone Encounter (Signed)
LMOM notifying normal PSA

## 2018-06-18 NOTE — Telephone Encounter (Signed)
-----   Message from Jerilee Field, MD sent at 06/18/2018 11:32 AM EDT ----- Let Mr Fairbairn know his PSA is normal - looks good - give result  ----- Message ----- From: Honor Loh, CMA Sent: 06/18/2018  11:23 AM EDT To: Jerilee Field, MD   ----- Message ----- From: Interface, [facility] Lab Results In Sent: 06/18/2018   5:38 AM EDT To: [facility]

## 2018-07-08 ENCOUNTER — Other Ambulatory Visit: Payer: Medicare Other | Admitting: Urology

## 2018-08-18 ENCOUNTER — Other Ambulatory Visit: Payer: Self-pay

## 2018-08-18 ENCOUNTER — Ambulatory Visit
Admission: RE | Admit: 2018-08-18 | Discharge: 2018-08-18 | Disposition: A | Discharge status: Home / Self Care | Payer: Medicare Other | Source: Ambulatory Visit | Attending: Urology | Admitting: Urology

## 2018-08-18 DIAGNOSIS — M545 Low back pain, unspecified: Secondary | ICD-10-CM

## 2018-08-18 DIAGNOSIS — G8929 Other chronic pain: Secondary | ICD-10-CM | POA: Diagnosis not present

## 2018-08-18 DIAGNOSIS — N281 Cyst of kidney, acquired: Secondary | ICD-10-CM | POA: Diagnosis not present

## 2018-08-18 DIAGNOSIS — R3912 Poor urinary stream: Secondary | ICD-10-CM | POA: Insufficient documentation

## 2018-08-21 ENCOUNTER — Other Ambulatory Visit: Payer: Medicare Other | Admitting: Urology

## 2018-08-28 DIAGNOSIS — M5136 Other intervertebral disc degeneration, lumbar region: Secondary | ICD-10-CM | POA: Diagnosis not present

## 2018-08-28 DIAGNOSIS — M5030 Other cervical disc degeneration, unspecified cervical region: Secondary | ICD-10-CM | POA: Diagnosis not present

## 2018-08-28 DIAGNOSIS — M47816 Spondylosis without myelopathy or radiculopathy, lumbar region: Secondary | ICD-10-CM | POA: Diagnosis not present

## 2018-08-28 DIAGNOSIS — I10 Essential (primary) hypertension: Secondary | ICD-10-CM | POA: Diagnosis not present

## 2018-08-30 ENCOUNTER — Other Ambulatory Visit: Payer: Self-pay | Admitting: Podiatry

## 2018-09-01 MED ORDER — MELOXICAM 7.5 MG PO TABS: 7.5000 mg | ORAL_TABLET | Freq: Two times a day (BID) | ORAL | 0 refills | Status: DC

## 2018-09-11 ENCOUNTER — Telehealth: Payer: Self-pay | Admitting: *Deleted

## 2018-09-11 ENCOUNTER — Encounter: Payer: Self-pay | Admitting: Urology

## 2018-09-11 ENCOUNTER — Other Ambulatory Visit: Payer: Self-pay

## 2018-09-11 ENCOUNTER — Ambulatory Visit: Payer: Medicare Other | Admitting: Urology

## 2018-09-11 VITALS — BP 140/79 | HR 62 | Ht 66.0 in | Wt 188.2 lb

## 2018-09-11 DIAGNOSIS — N401 Enlarged prostate with lower urinary tract symptoms: Secondary | ICD-10-CM | POA: Diagnosis not present

## 2018-09-11 DIAGNOSIS — N138 Other obstructive and reflux uropathy: Secondary | ICD-10-CM

## 2018-09-11 DIAGNOSIS — R3912 Poor urinary stream: Secondary | ICD-10-CM

## 2018-09-11 LAB — URINALYSIS, COMPLETE
Bilirubin, UA: NEGATIVE
Glucose, UA: NEGATIVE
Ketones, UA: NEGATIVE
Leukocytes,UA: NEGATIVE
Nitrite, UA: NEGATIVE
Protein,UA: NEGATIVE
RBC, UA: NEGATIVE
Specific Gravity, UA: 1.02 (ref 1.005–1.030)
Urobilinogen, Ur: 0.2 mg/dL (ref 0.2–1.0)
pH, UA: 5.5 (ref 5.0–7.5)

## 2018-09-11 LAB — MICROSCOPIC EXAMINATION
Bacteria, UA: NONE SEEN
Epithelial Cells (non renal): NONE SEEN /hpf (ref 0–10)
RBC: NONE SEEN /hpf (ref 0–2)

## 2018-09-11 MED ORDER — SILODOSIN 8 MG PO CAPS: 8.0000 mg | ORAL_CAPSULE | Freq: Every day | ORAL | 6 refills | Status: DC

## 2018-09-11 NOTE — Telephone Encounter (Signed)
[facility] (Key: [id number]) Silodosin 8MG  capsules Status: Sent to Plan  Created: June 5th, 2020

## 2018-09-11 NOTE — Progress Notes (Signed)
09/11/2018 9:40 AM   Terry Cook [birth year] [id number]  Referring provider: Juluis Rainier, MD [address] [place], [place] [address]  Chief Complaint  Patient presents with  . Cysto    HPI:  68 yo with h/o weak stream and straining to void x 30 years. Prostate not all that large on prior exam. I saw him in [place] Oct 2018 and PSa was 1.47. His AUASS was 24. He started alfuzosin which helps a lot, but if he misses a dose his stream weakens. He has hesitancy. Weak stream. Needs to double void. Doesn't feel empty. He has no frequency or urgency. No incontinence. AUASS today is 19 (off meds). Noc x 3. He has a history of sleep apnea, back pain and coronary artery disease. No gross hematuria.   NG risk - extensive back surgery/pain. No urologic surgery. Tried tamsulosin remotely and recalled trouble with ED.   Past medical history significant for CAD/PCI/Plavix, nitroglycerin use, CPAP at night. Also history of thrombocytopenia and sees Dr. Gweneth Dimitri. BUN was 10.5 and creatinine 0.14 November 2016. He has severe back pain and takes oycodone daily. His brother had kidney cancer.   His PSA was normal at 1.7. Renal US normal 08/18/2018. He started alfuzosin.  He had improvement in his flow but he still has bothersome weak stream and straining to void.  He has "not where he wants to be".  He returns for cystoscopy but also to review renal ultrasound and discuss medication and management of BPH.   PMH: Past Medical History:  Diagnosis Date  . Anxiety   . CAD (coronary artery disease), native coronary artery 06/04/2011   s/p MI with PCI to left circ  . Chronic lower back pain   . DDD (degenerative disc disease), lumbar   . Depression   . High cholesterol   . Hypertension   . Irregular heart beats   . Kidney stones ~ 1975  . MI (myocardial infarction) (HCC) 05/2011  . OSA on CPAP     Surgical History: Past Surgical History:  Procedure Laterality Date  . CORONARY ANGIOPLASTY  WITH STENT PLACEMENT  05/2011   "1"  . CYST EXCISION Right ~ 2009   "index finger; in office"  . CYSTOSCOPY W/ STONE MANIPULATION  ~ 1975 X 2  . LEFT HEART CATHETERIZATION WITH CORONARY ANGIOGRAM N/A 06/04/2011   Procedure: LEFT HEART CATHETERIZATION WITH CORONARY ANGIOGRAM;  Surgeon: Othella Boyer, MD;  Location: [facility] CATH LAB;  Service: Cardiovascular;  Laterality: N/A;  . LEFT HEART CATHETERIZATION WITH CORONARY ANGIOGRAM N/A 11/18/2013   Procedure: LEFT HEART CATHETERIZATION WITH CORONARY ANGIOGRAM;  Surgeon: Lesleigh Noe, MD;  Location: [facility] CATH LAB;  Service: Cardiovascular;  Laterality: N/A;  . PERCUTANEOUS CORONARY STENT INTERVENTION (PCI-S)  11/18/2013   Procedure: PERCUTANEOUS CORONARY STENT INTERVENTION (PCI-S);  Surgeon: Lesleigh Noe, MD;  Location: [facility] CATH LAB;  Service: Cardiovascular;;    Home Medications:  Allergies as of 09/11/2018   No Known Allergies     Medication List       Accurate as of September 11, 2018  9:40 AM. If you have any questions, ask your nurse or doctor.        alfuzosin 10 MG 24 hr tablet Commonly known as:  UROXATRAL Take 1 tablet (10 mg total) by mouth daily with breakfast.   ALPRAZolam 0.5 MG tablet Commonly known as:  XANAX Take 0.5 mg by mouth 2 (two) times daily as needed for anxiety.   amLODipine 10  MG tablet Commonly known as:  NORVASC Take 10 mg by mouth daily.   aspirin EC 81 MG tablet Take 81 mg by mouth daily.   atorvastatin 80 MG tablet Commonly known as:  LIPITOR Take 1 tablet (80 mg total) by mouth daily.   clopidogrel 75 MG tablet Commonly known as:  PLAVIX Take 75 mg by mouth daily.   losartan-hydrochlorothiazide 100-25 MG tablet Commonly known as:  HYZAAR Take 1 tablet by mouth daily.   meloxicam 7.5 MG tablet Commonly known as:  MOBIC Take 1 tablet (7.5 mg total) by mouth 2 (two) times daily.   nitroGLYCERIN 0.4 MG SL tablet Commonly known as:  NITROSTAT See admin instructions.   Oxycodone HCl 10 MG Tabs    Xtampza ER 9 MG C12a Generic drug:  oxyCODONE ER Take 9 mg by mouth 2 (two) times daily.   zolpidem 10 MG tablet Commonly known as:  AMBIEN Take 10 mg by mouth at bedtime.       Allergies: No Known Allergies  Family History: Family History  Problem Relation Age of Onset  . Coronary artery disease Father 71  . Coronary artery disease Brother 29  . Non-Hodgkin's lymphoma Mother        Had a "spleen cancer" and died from it when the patient was 68yo  . Kidney cancer Brother   . Lupus Daughter     Social History:  reports that he has never smoked. He has never used smokeless tobacco. He reports current alcohol use of about 2.0 standard drinks of alcohol per week. He reports that he does not use drugs.  ROS:                                        Physical Exam: Blood pressure 140/79, pulse 62, height  (1.676 m), weight 85.4 kg. NED. A&Ox3.   No respiratory distress   Abd soft, NT, ND Normal phallus with bilateral descended testicles  Cystoscopy Procedure Note  Patient identification was confirmed, informed consent was obtained, and patient was prepped using Betadine solution.  Lidocaine jelly was administered per urethral meatus.     Pre-Procedure: - Inspection reveals a normal caliber ureteral meatus.  Procedure: The flexible cystoscope was introduced without difficulty - No urethral strictures/lesions are present. - prostate revealed symmetric lateral lobe hypertrophy with visual obstruction, prostatic urethra approximately 4 cm. -  bladder neck appeared normal.   - Bilateral ureteral orifices identified - Bladder mucosa  reveals no ulcers, tumors, or lesions - No bladder stones - No trabeculation  Retroflexion shows normal bladder neck.He did not have significant prostatic protrusion into the bladder (visually less than it looked on the [place]).   Post-Procedure: - Patient tolerated the procedure well but had some pain. He might do  better in the OR for procedures.    Laboratory Data: Lab Results  Component Value Date   WBC 8.0 03/13/2017   HGB 14.5 03/13/2017   HCT 42.4 03/13/2017   MCV 88.5 03/13/2017   PLT 115 (L) 03/13/2017    Lab Results  Component Value Date   CREATININE 0.9 11/11/2016    No results found for: PSA  No results found for: TESTOSTERONE  No results found for: HGBA1C  Urinalysis No results found for: COLORURINE, APPEARANCEUR, LABSPEC, PHURINE, GLUCOSEU, HGBUR, BILIRUBINUR, KETONESUR, PROTEINUR, UROBILINOGEN, NITRITE, LEUKOCYTESUR  No results found for: LABMICR, WBCUA, RBCUA, LABEPIT, MUCUS, BACTERIA  Pertinent  Imaging: Renal US  No results found for this or any previous visit. No results found for this or any previous visit. No results found for this or any previous visit. No results found for this or any previous visit. Results for orders placed during the hospital encounter of 08/18/18  US RENAL   Narrative CLINICAL DATA:  Initial evaluation for chronic back pain with weak stream.  EXAM: RENAL / URINARY TRACT ULTRASOUND COMPLETE  COMPARISON:  None available.  FINDINGS: Right Kidney:  Renal measurements: 11.7 x 5.3 x 4.5 cm = volume: 146.4 mL . Echogenicity within normal limits. No mass or hydronephrosis visualized.  Left Kidney:  Renal measurements: 12.4 x 5.8 x 5.2 cm = volume: 196.0 mL. Echogenicity within normal limits. Left kidney demonstrates a mildly lobulated contour. No hydronephrosis. 8 x 9 x 6 mm simple exophytic cyst extends from the upper pole of the left kidney. No internal vascularity.  Bladder:  Appears normal for degree of bladder distention.  IMPRESSION: 1. 9 mm simple exophytic cyst at the upper pole of the left kidney. 2. Otherwise unremarkable and normal renal ultrasound. No hydronephrosis or other acute finding.   Electronically Signed   By: Rise MuBenjamin  McClintock [name]   On: 08/18/2018 22:08    No results found for this or any  previous visit. No results found for this or any previous visit. No results found for this or any previous visit.  Assessment & Plan:    1. Weak urinary stream, back pain - US showed no stone or hydro. Cysto  - Urinalysis, Complete  2. BPH with obstruction/lower urinary tract symptoms alfuzosin improving symptoms but patient still has bothersome weak stream.  We discussed the nature risk benefits and alternatives to silodosin and he'll start 8 mg daily. He's a good candidate for TURP, laser or Urolift and we discussed procedures.  - Urinalysis, Complete   No follow-ups on file.  Jerilee FieldMatthew Alanis Clift, MD  [facility] [address], [address] [place], [place] [address] [phone]

## 2018-09-11 NOTE — Patient Instructions (Signed)

## 2018-09-14 DIAGNOSIS — H40023 Open angle with borderline findings, high risk, bilateral: Secondary | ICD-10-CM | POA: Diagnosis not present

## 2018-09-14 DIAGNOSIS — H43813 Vitreous degeneration, bilateral: Secondary | ICD-10-CM | POA: Diagnosis not present

## 2018-09-14 DIAGNOSIS — H43393 Other vitreous opacities, bilateral: Secondary | ICD-10-CM | POA: Diagnosis not present

## 2018-09-14 DIAGNOSIS — H2513 Age-related nuclear cataract, bilateral: Secondary | ICD-10-CM | POA: Diagnosis not present

## 2018-10-15 DIAGNOSIS — I10 Essential (primary) hypertension: Secondary | ICD-10-CM | POA: Diagnosis not present

## 2018-10-15 DIAGNOSIS — E7800 Pure hypercholesterolemia, unspecified: Secondary | ICD-10-CM | POA: Diagnosis not present

## 2018-10-15 DIAGNOSIS — F419 Anxiety disorder, unspecified: Secondary | ICD-10-CM | POA: Diagnosis not present

## 2018-10-15 DIAGNOSIS — D696 Thrombocytopenia, unspecified: Secondary | ICD-10-CM | POA: Diagnosis not present

## 2018-10-19 DIAGNOSIS — I10 Essential (primary) hypertension: Secondary | ICD-10-CM | POA: Diagnosis not present

## 2018-10-19 DIAGNOSIS — G4730 Sleep apnea, unspecified: Secondary | ICD-10-CM | POA: Diagnosis not present

## 2018-10-19 DIAGNOSIS — G8929 Other chronic pain: Secondary | ICD-10-CM | POA: Diagnosis not present

## 2018-10-19 DIAGNOSIS — F419 Anxiety disorder, unspecified: Secondary | ICD-10-CM | POA: Diagnosis not present

## 2018-11-20 ENCOUNTER — Other Ambulatory Visit: Payer: Self-pay

## 2018-11-20 ENCOUNTER — Other Ambulatory Visit
Admission: RE | Admit: 2018-11-20 | Discharge: 2018-11-20 | Disposition: A | Discharge status: Home / Self Care | Source: Ambulatory Visit | Attending: Orthopedic Surgery | Admitting: Orthopedic Surgery

## 2018-11-20 DIAGNOSIS — M5136 Other intervertebral disc degeneration, lumbar region: Secondary | ICD-10-CM | POA: Diagnosis not present

## 2018-11-20 DIAGNOSIS — M75121 Complete rotator cuff tear or rupture of right shoulder, not specified as traumatic: Secondary | ICD-10-CM | POA: Diagnosis present

## 2018-11-20 DIAGNOSIS — M19011 Primary osteoarthritis, right shoulder: Secondary | ICD-10-CM | POA: Diagnosis not present

## 2018-11-20 DIAGNOSIS — S46111A Strain of muscle, fascia and tendon of long head of biceps, right arm, initial encounter: Secondary | ICD-10-CM | POA: Diagnosis not present

## 2018-11-20 DIAGNOSIS — Z7902 Long term (current) use of antithrombotics/antiplatelets: Secondary | ICD-10-CM | POA: Diagnosis not present

## 2018-11-20 DIAGNOSIS — Z20828 Contact with and (suspected) exposure to other viral communicable diseases: Secondary | ICD-10-CM | POA: Diagnosis not present

## 2018-11-20 DIAGNOSIS — Z7982 Long term (current) use of aspirin: Secondary | ICD-10-CM | POA: Diagnosis not present

## 2018-11-20 DIAGNOSIS — G4733 Obstructive sleep apnea (adult) (pediatric): Secondary | ICD-10-CM | POA: Diagnosis not present

## 2018-11-20 DIAGNOSIS — E669 Obesity, unspecified: Secondary | ICD-10-CM | POA: Diagnosis not present

## 2018-11-20 DIAGNOSIS — I2510 Atherosclerotic heart disease of native coronary artery without angina pectoris: Secondary | ICD-10-CM | POA: Diagnosis not present

## 2018-11-20 DIAGNOSIS — Z79899 Other long term (current) drug therapy: Secondary | ICD-10-CM | POA: Diagnosis not present

## 2018-11-20 DIAGNOSIS — Z791 Long term (current) use of non-steroidal anti-inflammatories (NSAID): Secondary | ICD-10-CM | POA: Diagnosis not present

## 2018-11-20 DIAGNOSIS — F329 Major depressive disorder, single episode, unspecified: Secondary | ICD-10-CM | POA: Diagnosis not present

## 2018-11-20 DIAGNOSIS — I252 Old myocardial infarction: Secondary | ICD-10-CM | POA: Diagnosis not present

## 2018-11-20 DIAGNOSIS — F419 Anxiety disorder, unspecified: Secondary | ICD-10-CM | POA: Diagnosis not present

## 2018-11-20 DIAGNOSIS — X58XXXA Exposure to other specified factors, initial encounter: Secondary | ICD-10-CM | POA: Diagnosis not present

## 2018-11-20 DIAGNOSIS — Z955 Presence of coronary angioplasty implant and graft: Secondary | ICD-10-CM | POA: Diagnosis not present

## 2018-11-20 DIAGNOSIS — E7800 Pure hypercholesterolemia, unspecified: Secondary | ICD-10-CM | POA: Diagnosis not present

## 2018-11-20 DIAGNOSIS — I10 Essential (primary) hypertension: Secondary | ICD-10-CM | POA: Diagnosis not present

## 2018-11-20 DIAGNOSIS — M25811 Other specified joint disorders, right shoulder: Secondary | ICD-10-CM | POA: Diagnosis not present

## 2018-11-20 DIAGNOSIS — Z6829 Body mass index (BMI) 29.0-29.9, adult: Secondary | ICD-10-CM | POA: Diagnosis not present

## 2018-11-21 LAB — SARS CORONAVIRUS 2 (TAT 6-24 HRS): SARS Coronavirus 2: NEGATIVE

## 2018-11-23 ENCOUNTER — Encounter
Admission: RE | Admit: 2018-11-23 | Discharge: 2018-11-23 | Disposition: A | Discharge status: Home / Self Care | Source: Ambulatory Visit | Attending: Orthopedic Surgery | Admitting: Orthopedic Surgery

## 2018-11-23 ENCOUNTER — Other Ambulatory Visit: Payer: Self-pay | Admitting: Orthopedic Surgery

## 2018-11-23 ENCOUNTER — Other Ambulatory Visit: Payer: Self-pay

## 2018-11-23 DIAGNOSIS — Z01818 Encounter for other preprocedural examination: Secondary | ICD-10-CM | POA: Insufficient documentation

## 2018-11-23 DIAGNOSIS — I10 Essential (primary) hypertension: Secondary | ICD-10-CM | POA: Insufficient documentation

## 2018-11-23 DIAGNOSIS — Z20828 Contact with and (suspected) exposure to other viral communicable diseases: Secondary | ICD-10-CM | POA: Insufficient documentation

## 2018-11-23 HISTORY — DX: Personal history of urinary calculi: Z87.442

## 2018-11-23 LAB — CBC WITH DIFFERENTIAL/PLATELET
Abs Immature Granulocytes: 0.01 10*3/uL (ref 0.00–0.07)
Basophils Absolute: 0.1 10*3/uL (ref 0.0–0.1)
Basophils Relative: 1 %
Eosinophils Absolute: 0.4 10*3/uL (ref 0.0–0.5)
Eosinophils Relative: 6 %
HCT: 42.3 % (ref 39.0–52.0)
Hemoglobin: 14.8 g/dL (ref 13.0–17.0)
Immature Granulocytes: 0 %
Lymphocytes Relative: 30 %
Lymphs Abs: 2 10*3/uL (ref 0.7–4.0)
MCH: 31.1 pg (ref 26.0–34.0)
MCHC: 35 g/dL (ref 30.0–36.0)
MCV: 88.9 fL (ref 80.0–100.0)
Monocytes Absolute: 0.7 10*3/uL (ref 0.1–1.0)
Monocytes Relative: 10 %
Neutro Abs: 3.6 10*3/uL (ref 1.7–7.7)
Neutrophils Relative %: 53 %
Platelets: 117 10*3/uL — ABNORMAL LOW (ref 150–400)
RBC: 4.76 MIL/uL (ref 4.22–5.81)
RDW: 13.1 % (ref 11.5–15.5)
WBC: 6.7 10*3/uL (ref 4.0–10.5)
nRBC: 0 % (ref 0.0–0.2)

## 2018-11-23 LAB — PROTIME-INR
INR: 1.2 (ref 0.8–1.2)
Prothrombin Time: 14.6 seconds (ref 11.4–15.2)

## 2018-11-23 LAB — BASIC METABOLIC PANEL
Anion gap: 5 (ref 5–15)
BUN: 17 mg/dL (ref 8–23)
CO2: 34 mmol/L — ABNORMAL HIGH (ref 22–32)
Calcium: 9 mg/dL (ref 8.9–10.3)
Chloride: 103 mmol/L (ref 98–111)
Creatinine, Ser: 0.88 mg/dL (ref 0.61–1.24)
GFR calc Af Amer: >60 mL/min (ref >60)
GFR calc non Af Amer: >60 mL/min (ref >60)
Glucose, Bld: 87 mg/dL (ref 70–99)
Potassium: 3.6 mmol/L (ref 3.5–5.1)
Sodium: 142 mmol/L (ref 135–145)

## 2018-11-23 LAB — APTT: aPTT: 32 seconds (ref 24–36)

## 2018-11-23 NOTE — Patient Instructions (Signed)
Your procedure is scheduled on: 11/24/18 Report to [place]. To find out your arrival time please call [phone] between 1PM - 3PM on 11/23/18 today.  Remember: Instructions that are not followed completely may result in serious medical risk, up to and including death, or upon the discretion of your surgeon and anesthesiologist your surgery may need to be rescheduled.     _X__ 1. Do not eat food after midnight the night before your procedure.                 No gum chewing or hard candies. You may drink clear liquids up to 2 hours                 before you are scheduled to arrive for your surgery- DO not drink clear                 liquids within 2 hours of the start of your surgery.                 Clear Liquids include:  water, apple juice without pulp, clear carbohydrate                 drink such as Clearfast or Gatorade, Black Coffee or Tea (Do not add                 anything to coffee or tea). Diabetics water only  __X__2.  On the morning of surgery brush your teeth with toothpaste and water, you                 may rinse your mouth with mouthwash if you wish.  Do not swallow any              toothpaste of mouthwash.     _X__ 3.  No Alcohol for 24 hours before or after surgery.   _X__ 4.  Do Not Smoke or use e-cigarettes For 24 Hours Prior to Your Surgery.                 Do not use any chewable tobacco products for at least 6 hours prior to                 surgery.  ____  5.  Bring all medications with you on the day of surgery if instructed.   __X__  6.  Notify your doctor if there is any change in your medical condition      (cold, fever, infections).     Do not wear jewelry, make-up, hairpins, clips or nail polish. Do not wear lotions, powders, or perfumes.  Do not shave 48 hours prior to surgery. Men may shave face and neck. Do not bring valuables to the hospital.    [facility] is not responsible for any  belongings or valuables.  Contacts, dentures/partials or body piercings may not be worn into surgery. Bring a case for your contacts, glasses or hearing aids, a denture cup will be supplied. Leave your suitcase in the car. After surgery it may be brought to your room. For patients admitted to the hospital, discharge time is determined by your treatment team.   Patients discharged the day of surgery will not be allowed to drive home.   Please read over the following fact sheets that you were given:   MRSA Information  __X__ Take these medicines the morning of surgery with A SIP OF WATER:  1. amLODipine (NORVASC  2. silodosin (RAPAFLO  3. Oxycodone if needed  4.  5.  6.  ____ Fleet Enema (as directed)   __X__ Use CHG Soap/SAGE wipes as directed  ____ Use inhalers on the day of surgery  ____ Stop metformin/Janumet/Farxiga 2 days prior to surgery    ____ Take 1/2 of usual insulin dose the night before surgery. No insulin the morning          of surgery.   __X__ Stop Blood Thinners Coumadin/Plavix/Xarelto/Pleta/Pradaxa/Eliquis/Effient/Aspirin  on   Or contact your Surgeon, Cardiologist or Medical Doctor regarding  ability to stop your blood thinners  __X__ Stop Anti-inflammatories 7 days before surgery such as Advil, Ibuprofen, Motrin,  BC or Goodies Powder, Naprosyn, Naproxen, Aleve, Aspirin    __X__ Stop all herbal supplements, fish oil or vitamin E until after surgery.    __X__ Bring C-Pap to the hospital.

## 2018-11-24 ENCOUNTER — Ambulatory Visit: Admitting: Anesthesiology

## 2018-11-24 ENCOUNTER — Ambulatory Visit

## 2018-11-24 ENCOUNTER — Ambulatory Visit
Admission: RE | Admit: 2018-11-24 | Discharge: 2018-11-24 | Disposition: A | Discharge status: Home / Self Care | Attending: Orthopedic Surgery | Admitting: Orthopedic Surgery

## 2018-11-24 ENCOUNTER — Encounter: Payer: Self-pay | Admitting: Emergency Medicine

## 2018-11-24 ENCOUNTER — Encounter
Admission: RE | Disposition: A | Discharge status: Home / Self Care | Payer: Self-pay | Source: Home / Self Care | Attending: Orthopedic Surgery

## 2018-11-24 DIAGNOSIS — Z20828 Contact with and (suspected) exposure to other viral communicable diseases: Secondary | ICD-10-CM | POA: Insufficient documentation

## 2018-11-24 DIAGNOSIS — G4733 Obstructive sleep apnea (adult) (pediatric): Secondary | ICD-10-CM | POA: Insufficient documentation

## 2018-11-24 DIAGNOSIS — Z955 Presence of coronary angioplasty implant and graft: Secondary | ICD-10-CM | POA: Insufficient documentation

## 2018-11-24 DIAGNOSIS — S46111A Strain of muscle, fascia and tendon of long head of biceps, right arm, initial encounter: Secondary | ICD-10-CM | POA: Insufficient documentation

## 2018-11-24 DIAGNOSIS — Z79899 Other long term (current) drug therapy: Secondary | ICD-10-CM | POA: Insufficient documentation

## 2018-11-24 DIAGNOSIS — E7800 Pure hypercholesterolemia, unspecified: Secondary | ICD-10-CM | POA: Insufficient documentation

## 2018-11-24 DIAGNOSIS — Z6829 Body mass index (BMI) 29.0-29.9, adult: Secondary | ICD-10-CM | POA: Insufficient documentation

## 2018-11-24 DIAGNOSIS — M75121 Complete rotator cuff tear or rupture of right shoulder, not specified as traumatic: Secondary | ICD-10-CM | POA: Insufficient documentation

## 2018-11-24 DIAGNOSIS — Z7982 Long term (current) use of aspirin: Secondary | ICD-10-CM | POA: Insufficient documentation

## 2018-11-24 DIAGNOSIS — F419 Anxiety disorder, unspecified: Secondary | ICD-10-CM | POA: Insufficient documentation

## 2018-11-24 DIAGNOSIS — Z791 Long term (current) use of non-steroidal anti-inflammatories (NSAID): Secondary | ICD-10-CM | POA: Insufficient documentation

## 2018-11-24 DIAGNOSIS — Z7902 Long term (current) use of antithrombotics/antiplatelets: Secondary | ICD-10-CM | POA: Insufficient documentation

## 2018-11-24 DIAGNOSIS — I2510 Atherosclerotic heart disease of native coronary artery without angina pectoris: Secondary | ICD-10-CM | POA: Insufficient documentation

## 2018-11-24 DIAGNOSIS — I252 Old myocardial infarction: Secondary | ICD-10-CM | POA: Insufficient documentation

## 2018-11-24 DIAGNOSIS — M19011 Primary osteoarthritis, right shoulder: Secondary | ICD-10-CM | POA: Insufficient documentation

## 2018-11-24 DIAGNOSIS — M25811 Other specified joint disorders, right shoulder: Secondary | ICD-10-CM | POA: Insufficient documentation

## 2018-11-24 DIAGNOSIS — F329 Major depressive disorder, single episode, unspecified: Secondary | ICD-10-CM | POA: Insufficient documentation

## 2018-11-24 DIAGNOSIS — X58XXXA Exposure to other specified factors, initial encounter: Secondary | ICD-10-CM | POA: Insufficient documentation

## 2018-11-24 DIAGNOSIS — I10 Essential (primary) hypertension: Secondary | ICD-10-CM | POA: Insufficient documentation

## 2018-11-24 DIAGNOSIS — M5136 Other intervertebral disc degeneration, lumbar region: Secondary | ICD-10-CM | POA: Insufficient documentation

## 2018-11-24 DIAGNOSIS — E669 Obesity, unspecified: Secondary | ICD-10-CM | POA: Insufficient documentation

## 2018-11-24 HISTORY — PX: SHOULDER ARTHROSCOPY WITH OPEN ROTATOR CUFF REPAIR: SHX6092

## 2018-11-24 SURGERY — ARTHROSCOPY, SHOULDER WITH REPAIR, ROTATOR CUFF, OPEN
Anesthesia: General | Site: Shoulder | Laterality: Right

## 2018-11-24 MED ORDER — LIDOCAINE HCL (PF) 1 % IJ SOLN
INTRAMUSCULAR | Status: DC | PRN
  Administered 2018-11-24: 3 mL

## 2018-11-24 MED ORDER — PROMETHAZINE HCL 25 MG/ML IJ SOLN: 6.2500 mg | INTRAMUSCULAR | Status: DC | PRN

## 2018-11-24 MED ORDER — ONDANSETRON HCL 4 MG/2ML IJ SOLN
INTRAMUSCULAR | Status: DC | PRN
  Administered 2018-11-24: 4 mg via INTRAVENOUS

## 2018-11-24 MED ORDER — FENTANYL CITRATE (PF) 100 MCG/2ML IJ SOLN
INTRAMUSCULAR | Status: DC | PRN
  Administered 2018-11-24: 50 ug via INTRAVENOUS

## 2018-11-24 MED ORDER — BUPIVACAINE HCL (PF) 0.25 % IJ SOLN
INTRAMUSCULAR | Status: AC
  Filled 2018-11-24: qty 30

## 2018-11-24 MED ORDER — OXYCODONE HCL 5 MG PO TABS: 5.0000 mg | ORAL_TABLET | ORAL | 0 refills | Status: DC | PRN

## 2018-11-24 MED ORDER — SUCCINYLCHOLINE CHLORIDE 20 MG/ML IJ SOLN
INTRAMUSCULAR | Status: DC | PRN
  Administered 2018-11-24: 100 mg via INTRAVENOUS

## 2018-11-24 MED ORDER — ONDANSETRON HCL 4 MG PO TABS: 4.0000 mg | ORAL_TABLET | Freq: Three times a day (TID) | ORAL | 0 refills | Status: DC | PRN

## 2018-11-24 MED ORDER — CEFAZOLIN SODIUM-DEXTROSE 2-4 GM/100ML-% IV SOLN
2.0000 g | INTRAVENOUS | Status: AC
  Administered 2018-11-24: 2 g via INTRAVENOUS

## 2018-11-24 MED ORDER — FENTANYL CITRATE (PF) 100 MCG/2ML IJ SOLN
25.0000 ug | INTRAMUSCULAR | Status: DC | PRN
  Administered 2018-11-24 (×2): 50 ug via INTRAVENOUS

## 2018-11-24 MED ORDER — EPINEPHRINE (ANAPHYLAXIS) 30 MG/30ML IJ SOLN
INTRAMUSCULAR | Status: AC
  Filled 2018-11-24: qty 30

## 2018-11-24 MED ORDER — BUPIVACAINE LIPOSOME 1.3 % IJ SUSP
INTRAMUSCULAR | Status: AC
  Filled 2018-11-24: qty 20

## 2018-11-24 MED ORDER — MIDAZOLAM HCL 2 MG/2ML IJ SOLN
1.0000 mg | Freq: Once | INTRAMUSCULAR | Status: AC
  Administered 2018-11-24: 07:00:00 1 mg via INTRAVENOUS

## 2018-11-24 MED ORDER — LIDOCAINE HCL (PF) 1 % IJ SOLN
INTRAMUSCULAR | Status: AC
  Filled 2018-11-24: qty 30

## 2018-11-24 MED ORDER — OXYCODONE HCL 5 MG PO TABS
5.0000 mg | ORAL_TABLET | Freq: Once | ORAL | Status: AC | PRN
  Administered 2018-11-24: 5 mg via ORAL

## 2018-11-24 MED ORDER — DEXAMETHASONE SODIUM PHOSPHATE 10 MG/ML IJ SOLN
INTRAMUSCULAR | Status: DC | PRN
  Administered 2018-11-24: 10 mg via INTRAVENOUS

## 2018-11-24 MED ORDER — FENTANYL CITRATE (PF) 100 MCG/2ML IJ SOLN
INTRAMUSCULAR | Status: AC
  Filled 2018-11-24: qty 2

## 2018-11-24 MED ORDER — SUGAMMADEX SODIUM 200 MG/2ML IV SOLN
INTRAVENOUS | Status: DC | PRN
  Administered 2018-11-24: 200 mg via INTRAVENOUS

## 2018-11-24 MED ORDER — BUPIVACAINE HCL (PF) 0.5 % IJ SOLN
INTRAMUSCULAR | Status: DC | PRN
  Administered 2018-11-24: 10 mL via PERINEURAL

## 2018-11-24 MED ORDER — FENTANYL CITRATE (PF) 100 MCG/2ML IJ SOLN
INTRAMUSCULAR | Status: AC
  Administered 2018-11-24: 50 ug via INTRAVENOUS
  Filled 2018-11-24: qty 2

## 2018-11-24 MED ORDER — EPHEDRINE SULFATE 50 MG/ML IJ SOLN
INTRAMUSCULAR | Status: DC | PRN
  Administered 2018-11-24: 10 mg via INTRAVENOUS

## 2018-11-24 MED ORDER — LACTATED RINGERS IV SOLN
INTRAVENOUS | Status: DC
  Administered 2018-11-24: 06:00:00 via INTRAVENOUS

## 2018-11-24 MED ORDER — LIDOCAINE HCL (PF) 1 % IJ SOLN
INTRAMUSCULAR | Status: AC
  Filled 2018-11-24: qty 5

## 2018-11-24 MED ORDER — NEOMYCIN-POLYMYXIN B GU 40-200000 IR SOLN
Status: AC
  Filled 2018-11-24: qty 2

## 2018-11-24 MED ORDER — SUCCINYLCHOLINE CHLORIDE 20 MG/ML IJ SOLN
INTRAMUSCULAR | Status: AC
  Filled 2018-11-24: qty 1

## 2018-11-24 MED ORDER — OXYCODONE HCL 5 MG PO TABS
ORAL_TABLET | ORAL | Status: AC
  Filled 2018-11-24: qty 1

## 2018-11-24 MED ORDER — EPINEPHRINE PF 1 MG/ML IJ SOLN
INTRAMUSCULAR | Status: DC | PRN
  Administered 2018-11-24: 16 mL

## 2018-11-24 MED ORDER — OXYCODONE HCL 5 MG/5ML PO SOLN: 5.0000 mg | Freq: Once | ORAL | Status: AC | PRN

## 2018-11-24 MED ORDER — ONDANSETRON HCL 4 MG/2ML IJ SOLN
INTRAMUSCULAR | Status: AC
  Filled 2018-11-24: qty 2

## 2018-11-24 MED ORDER — MEPERIDINE HCL 50 MG/ML IJ SOLN: 6.2500 mg | INTRAMUSCULAR | Status: DC | PRN

## 2018-11-24 MED ORDER — SODIUM CHLORIDE 0.9 % IV SOLN
INTRAVENOUS | Status: DC | PRN
  Administered 2018-11-24: 25 ug/min via INTRAVENOUS

## 2018-11-24 MED ORDER — BUPIVACAINE HCL (PF) 0.5 % IJ SOLN
INTRAMUSCULAR | Status: AC
  Filled 2018-11-24: qty 10

## 2018-11-24 MED ORDER — BUPIVACAINE LIPOSOME 1.3 % IJ SUSP
INTRAMUSCULAR | Status: DC | PRN
  Administered 2018-11-24: 20 mL via PERINEURAL

## 2018-11-24 MED ORDER — PROPOFOL 10 MG/ML IV BOLUS
INTRAVENOUS | Status: DC | PRN
  Administered 2018-11-24: 150 mg via INTRAVENOUS

## 2018-11-24 MED ORDER — FAMOTIDINE 20 MG PO TABS: 20.0000 mg | ORAL_TABLET | Freq: Once | ORAL | Status: DC

## 2018-11-24 MED ORDER — ROCURONIUM BROMIDE 50 MG/5ML IV SOLN
INTRAVENOUS | Status: AC
  Filled 2018-11-24: qty 1

## 2018-11-24 MED ORDER — LIDOCAINE HCL (CARDIAC) PF 100 MG/5ML IV SOSY
PREFILLED_SYRINGE | INTRAVENOUS | Status: DC | PRN
  Administered 2018-11-24: 100 mg via INTRAVENOUS

## 2018-11-24 MED ORDER — CHLORHEXIDINE GLUCONATE CLOTH 2 % EX PADS: 6.0000 | MEDICATED_PAD | Freq: Once | CUTANEOUS | Status: DC

## 2018-11-24 MED ORDER — FAMOTIDINE 20 MG PO TABS
ORAL_TABLET | ORAL | Status: AC
  Filled 2018-11-24: qty 1

## 2018-11-24 MED ORDER — FENTANYL CITRATE (PF) 100 MCG/2ML IJ SOLN
50.0000 ug | Freq: Once | INTRAMUSCULAR | Status: AC
  Administered 2018-11-24: 50 ug via INTRAVENOUS

## 2018-11-24 MED ORDER — LIDOCAINE HCL (PF) 2 % IJ SOLN
INTRAMUSCULAR | Status: AC
  Filled 2018-11-24: qty 10

## 2018-11-24 MED ORDER — CEFAZOLIN SODIUM-DEXTROSE 2-4 GM/100ML-% IV SOLN
INTRAVENOUS | Status: AC
  Filled 2018-11-24: qty 100

## 2018-11-24 MED ORDER — NEOMYCIN-POLYMYXIN B GU 40-200000 IR SOLN
Status: DC | PRN
  Administered 2018-11-24: 2 mL

## 2018-11-24 MED ORDER — PROPOFOL 10 MG/ML IV BOLUS
INTRAVENOUS | Status: AC
  Filled 2018-11-24: qty 20

## 2018-11-24 MED ORDER — EPHEDRINE SULFATE 50 MG/ML IJ SOLN
INTRAMUSCULAR | Status: AC
  Filled 2018-11-24: qty 1

## 2018-11-24 MED ORDER — MIDAZOLAM HCL 2 MG/2ML IJ SOLN
INTRAMUSCULAR | Status: AC
  Administered 2018-11-24: 1 mg via INTRAVENOUS
  Filled 2018-11-24: qty 2

## 2018-11-24 MED ORDER — ROCURONIUM BROMIDE 100 MG/10ML IV SOLN
INTRAVENOUS | Status: DC | PRN
  Administered 2018-11-24: 30 mg via INTRAVENOUS
  Administered 2018-11-24: 10 mg via INTRAVENOUS

## 2018-11-24 MED ORDER — DEXAMETHASONE SODIUM PHOSPHATE 10 MG/ML IJ SOLN
INTRAMUSCULAR | Status: AC
  Filled 2018-11-24: qty 1

## 2018-11-24 SURGICAL SUPPLY — 71 items
ADAPTER IRRIG TUBE 2 SPIKE SOL (ADAPTER) ×4 IMPLANT
ANCHOR ALL-SUT Q-FIX 2.8 (Anchor) ×4 IMPLANT
ANCHOR SUT 5.5 MULTIFIX (Orthopedic Implant) ×6 IMPLANT
BUR RADIUS 4.0X18.5 (BURR) ×2 IMPLANT
BUR RADIUS 5.5 (BURR) ×2 IMPLANT
CANNULA 5.75X7 CRYSTAL CLEAR (CANNULA) ×4 IMPLANT
CANNULA PARTIAL THREAD 2X7 (CANNULA) ×2 IMPLANT
CANNULA TWIST IN 8.25X9CM (CANNULA) IMPLANT
CONNECTOR PERFECT PASSER (CONNECTOR) ×2 IMPLANT
COOLER POLAR GLACIER W/PUMP (MISCELLANEOUS) ×2 IMPLANT
COVER WAND RF STERILE (DRAPES) ×2 IMPLANT
CRADLE LAMINECT ARM (MISCELLANEOUS) ×2 IMPLANT
DEVICE SUCT BLK HOLE OR FLOOR (MISCELLANEOUS) IMPLANT
DRAPE 3/4 80X56 (DRAPES) ×2 IMPLANT
DRAPE INCISE IOBAN 66X45 STRL (DRAPES) ×2 IMPLANT
DRAPE SPLIT 6X30 W/TAPE (DRAPES) ×4 IMPLANT
DRAPE U-SHAPE 47X51 STRL (DRAPES) IMPLANT
DURAPREP 26ML APPLICATOR (WOUND CARE) ×6 IMPLANT
ELECT REM PT RETURN 9FT ADLT (ELECTROSURGICAL) ×2
ELECTRODE REM PT RTRN 9FT ADLT (ELECTROSURGICAL) ×1 IMPLANT
GAUZE SPONGE 4X4 12PLY STRL (GAUZE/BANDAGES/DRESSINGS) ×4 IMPLANT
GAUZE XEROFORM 1X8 LF (GAUZE/BANDAGES/DRESSINGS) ×2 IMPLANT
GLOVE BIOGEL PI IND STRL 9 (GLOVE) ×1 IMPLANT
GLOVE BIOGEL PI INDICATOR 9 (GLOVE) ×1
GLOVE SURG 9.0 ORTHO LTXF (GLOVE) ×4 IMPLANT
GOWN STRL REUS TWL 2XL XL LVL4 (GOWN DISPOSABLE) ×2 IMPLANT
GOWN STRL REUS W/ TWL LRG LVL3 (GOWN DISPOSABLE) ×1 IMPLANT
GOWN STRL REUS W/ TWL LRG LVL4 (GOWN DISPOSABLE) ×1 IMPLANT
GOWN STRL REUS W/TWL LRG LVL3 (GOWN DISPOSABLE) ×1
GOWN STRL REUS W/TWL LRG LVL4 (GOWN DISPOSABLE) ×1
IV LACTATED RINGER IRRG 3000ML (IV SOLUTION) ×16
IV LR IRRIG 3000ML ARTHROMATIC (IV SOLUTION) ×16 IMPLANT
KIT STABILIZATION SHOULDER (MISCELLANEOUS) ×2 IMPLANT
KIT SUTURE 2.8 Q-FIX DISP (MISCELLANEOUS) ×2 IMPLANT
KIT SUTURETAK 3.0 INSERT PERC (KITS) IMPLANT
KIT TURNOVER KIT A (KITS) ×2 IMPLANT
MANIFOLD NEPTUNE II (INSTRUMENTS) ×2 IMPLANT
MASK FACE SPIDER DISP (MASK) ×2 IMPLANT
MAT ABSORB  FLUID 56X50 GRAY (MISCELLANEOUS) ×2
MAT ABSORB FLUID 56X50 GRAY (MISCELLANEOUS) ×2 IMPLANT
NDL SAFETY ECLIPSE 18X1.5 (NEEDLE) ×1 IMPLANT
NEEDLE HYPO 18GX1.5 SHARP (NEEDLE) ×1
NEEDLE HYPO 22GX1.5 SAFETY (NEEDLE) ×2 IMPLANT
NS IRRIG 500ML POUR BTL (IV SOLUTION) ×2 IMPLANT
PACK ARTHROSCOPY SHOULDER (MISCELLANEOUS) ×2 IMPLANT
PAD WRAPON POLAR SHDR XLG (MISCELLANEOUS) ×1 IMPLANT
PASSER SUT CAPTURE FIRST (SUTURE) IMPLANT
PASSER SUT FIRSTPASS SELF (INSTRUMENTS) ×4 IMPLANT
SET TUBE SUCT SHAVER OUTFL 24K (TUBING) ×2 IMPLANT
SET TUBE TIP INTRA-ARTICULAR (MISCELLANEOUS) ×2 IMPLANT
STRAP SAFETY 5IN WIDE (MISCELLANEOUS) ×2 IMPLANT
STRIP CLOSURE SKIN 1/2X4 (GAUZE/BANDAGES/DRESSINGS) ×4 IMPLANT
SUT ETHILON 4-0 (SUTURE) ×1
SUT ETHILON 4-0 FS2 18XMFL BLK (SUTURE) ×1
SUT LASSO 90 DEG SD STR (SUTURE) IMPLANT
SUT MNCRL 4-0 (SUTURE) ×1
SUT MNCRL 4-0 27XMFL (SUTURE) ×1
SUT PDS AB 0 CT1 27 (SUTURE) ×2 IMPLANT
SUT PERFECTPASSER WHITE CART (SUTURE) ×2 IMPLANT
SUT SMART STITCH CARTRIDGE (SUTURE) ×6 IMPLANT
SUT VIC AB 0 CT1 36 (SUTURE) ×2 IMPLANT
SUT VIC AB 2-0 CT2 27 (SUTURE) ×2 IMPLANT
SUTURE ETHLN 4-0 FS2 18XMF BLK (SUTURE) ×1 IMPLANT
SUTURE MAGNUM WIRE 2X48 BLK (SUTURE) IMPLANT
SUTURE MNCRL 4-0 27XMF (SUTURE) ×1 IMPLANT
SYR 10ML LL (SYRINGE) ×2 IMPLANT
TAPE MICROFOAM 4IN (TAPE) ×2 IMPLANT
TUBING ARTHRO INFLOW-ONLY STRL (TUBING) ×2 IMPLANT
TUBING CONNECTING 10 (TUBING) ×2 IMPLANT
WAND HAND CNTRL MULTIVAC 90 (MISCELLANEOUS) ×2 IMPLANT
WRAPON POLAR PAD SHDR XLG (MISCELLANEOUS) ×2

## 2018-11-24 NOTE — Progress Notes (Signed)
Pt complains of needing to void, urinal given to patient, unable to void. Patient request to stand to urinate due to enlarged prostate.  Pt stood at side of bed with assistance, unable to void. Bladder scan showed >687 ml of urine in bladder.  Coude catheter inserted into bladder without difficulty, 750 ml urine obtained. Catheter removed. Pt verbalizes relief.

## 2018-11-24 NOTE — Anesthesia Preprocedure Evaluation (Signed)
Anesthesia Evaluation  Patient identified by MRN, date of birth, ID band Patient awake    Reviewed: Allergy & Precautions, NPO status , Patient's Chart, lab work & pertinent test results  History of Anesthesia Complications Negative for: history of anesthetic complications  Airway Mallampati: III  TM Distance: <3 FB Neck ROM: Full    Dental  (+) Poor Dentition   Pulmonary sleep apnea and Continuous Positive Airway Pressure Ventilation , neg COPD,    breath sounds clear to auscultation- rhonchi (-) wheezing      Cardiovascular hypertension, (-) angina+ CAD, + Past MI and + Cardiac Stents  (-) CABG  Rhythm:Regular Rate:Normal - Systolic murmurs and - Diastolic murmurs    Neuro/Psych neg Seizures PSYCHIATRIC DISORDERS Anxiety Depression negative neurological ROS     GI/Hepatic negative GI ROS, Neg liver ROS,   Endo/Other  negative endocrine ROSneg diabetes  Renal/GU negative Renal ROS     Musculoskeletal  (+) Arthritis ,   Abdominal (+) - obese,   Peds  Hematology negative hematology ROS (+)   Anesthesia Other Findings Past Medical History: No date: Anxiety 06/04/2011: CAD (coronary artery disease), native coronary artery     Comment:  s/p MI with PCI to left circ No date: Chronic lower back pain No date: DDD (degenerative disc disease), lumbar No date: Depression No date: High cholesterol No date: History of kidney stones No date: Hypertension No date: Irregular heart beats 05/2011: MI (myocardial infarction) ([place]) No date: OSA on CPAP   Reproductive/Obstetrics                             Anesthesia Physical Anesthesia Plan  ASA: III  Anesthesia Plan: General   Post-op Pain Management:  Regional for Post-op pain   Induction: Intravenous  PONV Risk Score and Plan: 1 and Ondansetron and Midazolam  Airway Management Planned: Oral ETT  Additional Equipment:   Intra-op Plan:    Post-operative Plan: Extubation in OR  Informed Consent: I have reviewed the patients History and Physical, chart, labs and discussed the procedure including the risks, benefits and alternatives for the proposed anesthesia with the patient or authorized representative who has indicated his/her understanding and acceptance.     Dental advisory given  Plan Discussed with: CRNA and Anesthesiologist  Anesthesia Plan Comments:         Anesthesia Quick Evaluation

## 2018-11-24 NOTE — Anesthesia Procedure Notes (Signed)
Procedure Name: Intubation Date/Time: 11/24/2018 8:01 AM Performed by: Philbert Riser, CRNA Pre-anesthesia Checklist: Patient identified, Emergency Drugs available, Suction available, Patient being monitored and Timeout performed Patient Re-evaluated:Patient Re-evaluated prior to induction Oxygen Delivery Method: Circle system utilized and Simple face mask Preoxygenation: Pre-oxygenation with 100% oxygen Induction Type: IV induction Ventilation: Mask ventilation without difficulty Laryngoscope Size: McGraph and 3 Grade View: Grade II Tube type: Oral Tube size: 7.5 mm Number of attempts: 1 Airway Equipment and Method: Stylet Placement Confirmation: ETT inserted through vocal cords under direct vision,  positive ETCO2 and breath sounds checked- equal and bilateral Secured at: 21 cm Tube secured with: Tape

## 2018-11-24 NOTE — Anesthesia Procedure Notes (Signed)
Anesthesia Regional Block: Interscalene brachial plexus block   Pre-Anesthetic Checklist: ,, timeout performed, Correct Patient, Correct Site, Correct Laterality, Correct Procedure, Correct Position, site marked, Risks and benefits discussed,  Surgical consent,  Pre-op evaluation,  At surgeon's request and post-op pain management  Laterality: Right  Prep: chloraprep       Needles:  Injection technique: Single-shot  Needle Type: Stimiplex     Needle Length: 10cm  Needle Gauge: 21     Additional Needles:   Procedures:,,,, ultrasound used (permanent image in chart),,,,  Narrative:  Start time: 11/24/2018 7:19 AM End time: 11/24/2018 7:24 AM Injection made incrementally with aspirations every 5 mL.  Performed by: Personally  Anesthesiologist: Emmie Niemann, MD  Additional Notes: Functioning IV was confirmed and monitors were applied.  A Stimuplex needle was used. Sterile prep and drape,hand hygiene and sterile gloves were used.  Negative aspiration and negative test dose prior to incremental administration of local anesthetic. The patient tolerated the procedure well.

## 2018-11-24 NOTE — Discharge Instructions (Signed)

## 2018-11-24 NOTE — Op Note (Signed)
11/24/2018  11:45 AM  PATIENT:  Terry Cook  68 y.o. male  PRE-OPERATIVE DIAGNOSIS:  M75.121 complete rotator cuff tear or rupture of right shoulder not specified as traumatic  POST-OPERATIVE DIAGNOSIS: Full-thickness tear of the right supra and infraspinatus tendons, partial-thickness tear of the long head of the biceps tendon, subacromial impingement and acromioclavicular joint arthrosis  PROCEDURE:  Procedure(s): RIGHT SHOULDER ARTHROSCOPIC LYSIS OF ADHESIONS/CAPSULOTOMY, SUBACROMIAL DECOMPRESSION, DISTAL CLAVICLE EXCISION, MINI-OPEN ROTATOR CUFF REPAIR AND BICEPS TENODESIS   SURGEON:  Surgeon(s) and Role:    Thornton Park, MD - Primary  ANESTHESIA:   general and paracervical block  PREOPERATIVE INDICATIONS:  Terry Cook is a  68 y.o. male with a diagnosis of M75.121 complete rotator cuff tear or rupture of right shoulder not specified as traumatic who failed conservative measures and elected for surgical management.    The risks benefits and alternatives were discussed with the patient preoperatively including but not limited to the risks of infection, bleeding, nerve injury, persistent pain or weakness, failure of the hardware, re-tear of the rotator cuff and the need for further surgery. Medical risks include DVT and pulmonary embolism, myocardial infarction, stroke, pneumonia, respiratory failure and death. Patient understood these risks and wished to proceed.  OPERATIVE IMPLANTS: ArthroCare Multi fix anchors x 2 & Smith and Nephew Q Fix anchors x 2  OPERATIVE FINDINGS: Full-thickness rotator cuff tear involving the supra and infraspinatus tendons.  High-grade partial-thickness tear of the long head of the biceps tendon, fraying and small tear of the anterior inferior labrum, subacromial impingement and acromioclavicular joint arthrosis  OPERATIVE PROCEDURE: The patient was met in the preoperative area. The right shoulder was signed with the word yes and my initials  according the hospital's correct site of surgery protocol.  A preoperative history and physical was performed at the bedside.  The patient underwent placement of a  interscalene block with Exparel by the anesthesia service in the preoperative area.  I answered all questions by the patient prior to surgery.  Patient was brought to the operating room where they underwent general endotracheal intubation following interscalene block . The patient was placed in a beachchair position. A spider arm positioner was used for this case. Examination under anesthesia revealed no limitation of motion or instability with load shift testing. The patient had a negative sulcus sign.  The patient was prepped and draped in a sterile fashion. A timeout was performed to verify the patient's name, date of birth, medical record number, correct site of surgery and correct procedure to be performed there was also used to verify the patient received antibiotics that all appropriate instruments, implants and radiographs studies were available in the room. Once all in attendance were in agreement case began.  Bony landmarks were drawn out with a surgical marker along with proposed arthroscopy incisions.  An 11 blade was used to establish a posterior portal through which the arthroscope was placed in the glenohumeral joint. A full diagnostic examination of the shoulder was performed. the anterior portal was established under direct visualization with an 18-gauge spinal needle. A 5.75 the medial arthroscopic cannula was placed through this anterior portal.   The intra-articular portion of the biceps tendon was found to have a high-grade partial-thickness tear, therefore the decision was made to perform a tenodesis. An Arthocare Perfect Pass suture placed in the biceps tendon and a tenotomy was performed arthroscopy using a 90 degree ArthroCare wand. The labrum was debrided.  Patient was found to have adhesions and  a thickened anterior  capsule and these were debrided with a 90 degree ArthroCare wand and 4.0 mm resector shaver blade.  The arthroscope was then placed in the subacromial space. A lateral portal was then established using an 18-gauge spinal needle for localization.Extensive bursitis was encountered and debrided using a 4.0 resector shaver blade and a 90 ArthroCare wand from the lateral portal. A subacromial decompression was also performed using a 5.5 mm resector shaver blade from the lateral portal and a distal clavicle excision was performed with the 5.5 mm resector shaver blade via the anterior portal.    Four Arthrocare Perfect Pass sutures were placed in the lateral border of the rotator cuff tear. The greater tuberosity was debrided using a 4.0 mm resector shaver blade to remove all remaining foreign fibers of the rotator cuff. Debridement was performed until punctate bleeding was seen at the greater tuberosity footprint, which will allow for rotator cuff healing.  Final arthroscopic images were taken and all arthroscopic instruments were then removed and the mini-open portion of the procedure began.   A saber-type incision was made along the lateral border of the acromion. The deltoid muscle was identified and split in line with its fibers which allowed visualization of the rotator cuff. The biceps tendon and its associated tagging suture were brought out through the deltoid split. The Perfect Pass suture previously placed in the lateral border of the rotator cuff was also brought out through the deltoid split.   The perfect pass sutureplaced in the biceps tendonwas identified. A tenodesis was performed placing the biceps tendon at the top of the bicipital groove with a single multi fix anchor.   Two Q fix anchors were then placed at the articular margin of the humeral head. The 4 limbs of each anchor were then passed medially through the rotator cuff with a perfect pass suture passer. These were clamped  with a hemostat for later medial row fixation.  The four perfect pass sutures from the lateral border of the rotator cuff were then anchored to thegreater tuberosity of the humeral head using two Multifix anchors. These anchors were tensioned to allow for anatomic reduction of the rotator cuff to the greater tuberosity footprint. The medial row repair was then completed using an arthroscopic knot tying technique with the sutures from the Q fix anchors. Once all sutures were tied down, arthroscopic images of thedouble row repair were taken with the arthroscope both externally and arthroscopically fromthe glenohumeral joint.  All incisions were copiously irrigated. The deltoid fascia was repaired using a 0 Vicryl suturean interrupted fashion. . The subcutaneous tissue of all incisions were closed with a 2-0 Vicryl. Skin closure for the arthroscopic incisions was performed with 4-0 nylon. The skin edges of the saber incision were approximated with a running 4-0 undyed Monocryl. A dry sterile dressing was applied. The patient was placed in an abduction sling, with a Polar Care sleeve.  All sharp and instrument counts were correct at the conclusion of the case. I was scrubbed and present for the entire case. I spoke with the patient's wife postoperatively to let her know the case had been performed without complication and the patient was stable in recovery room.

## 2018-11-24 NOTE — Transfer of Care (Signed)
Immediate Anesthesia Transfer of Care Note  Patient: Terry Cook  Procedure(s) Performed: SHOULDER ARTHROSCOPY WITH OPEN MINI ROTATOR CUFF REPAIR, SUBACROMIAL DECOMPRESSION, DISTAL CLAVICLE EXCISION, BICEPS TENODESIS (Right Shoulder)  Patient Location: PACU  Anesthesia Type:General  Level of Consciousness: awake, alert  and oriented  Airway & Oxygen Therapy: Patient Spontanous Breathing and Patient connected to face mask oxygen  Post-op Assessment: Report given to RN and Post -op Vital signs reviewed and stable  Post vital signs: Reviewed and stable  Last Vitals:  Vitals Value Taken Time  BP 157/76 11/24/18 1133  Temp    Pulse 93 11/24/18 1139  Resp 9 11/24/18 1138  SpO2 99 % 11/24/18 1139  Vitals shown include unvalidated device data.  Last Pain:  Vitals:   11/24/18 0729  TempSrc:   PainSc: Asleep         Complications: No apparent anesthesia complications

## 2018-11-24 NOTE — Anesthesia Post-op Follow-up Note (Signed)
Anesthesia QCDR form completed.        

## 2018-11-24 NOTE — H&P (Signed)
PREOPERATIVE H&P  Chief Complaint: M75.121 complete rotator cuff tear or rupture of right shoulder not specified as traumatic  HPI: Terry Cook is a 68 y.o. male who presents for preoperative history and physical with a diagnosis of M75.121 complete rotator cuff tear or rupture of right shoulder not specified as traumatic. Symptoms of pain, weakness and middle range of motion are significantly impairing activities of daily living.  He has agreed with surgical management.   Past Medical History:  Diagnosis Date  . Anxiety   . CAD (coronary artery disease), native coronary artery 06/04/2011   s/p MI with PCI to left circ  . Chronic lower back pain   . DDD (degenerative disc disease), lumbar   . Depression   . High cholesterol   . History of kidney stones   . Hypertension   . Irregular heart beats   . MI (myocardial infarction) ([place]) 05/2011  . OSA on CPAP    Past Surgical History:  Procedure Laterality Date  . COLONOSCOPY    . CORONARY ANGIOPLASTY WITH STENT PLACEMENT  05/2011   "1"  . CYST EXCISION Right ~ 2009   "index finger; in office"  . CYSTOSCOPY W/ STONE MANIPULATION  ~ 1975 X 2  . LEFT HEART CATHETERIZATION WITH CORONARY ANGIOGRAM N/A 06/04/2011   Procedure: LEFT HEART CATHETERIZATION WITH CORONARY ANGIOGRAM;  Surgeon: Jacolyn Reedy, MD;  Location: [facility] CATH LAB;  Service: Cardiovascular;  Laterality: N/A;  . LEFT HEART CATHETERIZATION WITH CORONARY ANGIOGRAM N/A 11/18/2013   Procedure: LEFT HEART CATHETERIZATION WITH CORONARY ANGIOGRAM;  Surgeon: Sinclair Grooms, MD;  Location: [facility] CATH LAB;  Service: Cardiovascular;  Laterality: N/A;  . PERCUTANEOUS CORONARY STENT INTERVENTION (PCI-S)  11/18/2013   Procedure: PERCUTANEOUS CORONARY STENT INTERVENTION (PCI-S);  Surgeon: Sinclair Grooms, MD;  Location: [facility] CATH LAB;  Service: Cardiovascular;;   Social History   Socioeconomic History  . Marital status: Married    Spouse name: Not on file  . Number of children: Not on  file  . Years of education: Not on file  . Highest education level: Not on file  Occupational History  . Not on file  Social Needs  . Financial resource strain: Not on file  . Food insecurity    Worry: Not on file    Inability: Not on file  . Transportation needs    Medical: Not on file    Non-medical: Not on file  Tobacco Use  . Smoking status: Never Smoker  . Smokeless tobacco: Never Used  Substance and Sexual Activity  . Alcohol use: Not Currently    Alcohol/week: 2.0 standard drinks    Types: 2 Glasses of wine per week  . Drug use: No  . Sexual activity: Yes  Lifestyle  . Physical activity    Days per week: Not on file    Minutes per session: Not on file  . Stress: Not on file  Relationships  . Social Herbalist on phone: Not on file    Gets together: Not on file    Attends religious service: Not on file    Active member of club or organization: Not on file    Attends meetings of clubs or organizations: Not on file    Relationship status: Not on file  Other Topics Concern  . Not on file  Social History Narrative  . Not on file   Family History  Problem Relation Age of Onset  . Coronary artery disease Father 53  .  Coronary artery disease Brother [id number]  . Non-Hodgkin's lymphoma Mother        Had a "spleen cancer" and died from it when the patient was 68yo  . Kidney cancer Brother   . Lupus Daughter    No Known Allergies Prior to Admission medications   Medication Sig Start Date End Date Taking? Authorizing Provider  ALPRAZolam Prudy Feeler(XANAX) 0.5 MG tablet Take 0.5 mg by mouth 2 (two) times daily as needed for anxiety.   Yes [provider]  amLODipine (NORVASC) 10 MG tablet Take 10 mg by mouth daily. 10/25/16  Yes [provider]  aspirin EC 81 MG tablet Take 81 mg by mouth daily.   Yes [provider]  atorvastatin (LIPITOR) 80 MG tablet Take 1 tablet (80 mg total) by mouth daily. Patient taking differently: Take 80 mg by mouth  every evening.  02/27/18  Yes Baldo DaubMunley, Brian J, MD  calcium carbonate (TUMS - DOSED IN MG ELEMENTAL CALCIUM) 500 MG chewable tablet Chew 1 tablet by mouth daily as needed for indigestion or heartburn.   Yes [provider]  cetirizine (ZYRTEC) 10 MG tablet Take 10 mg by mouth daily as needed for allergies.   Yes [provider]  clopidogrel (PLAVIX) 75 MG tablet Take 75 mg by mouth daily. 07/11/15  Yes [provider]  diclofenac sodium (VOLTAREN) 1 % GEL Apply 1 application topically daily. Apply to feet   Yes [provider]  ibuprofen (ADVIL) 200 MG tablet Take 200 mg by mouth every 6 (six) hours as needed for moderate pain.   Yes [provider]  losartan-hydrochlorothiazide (HYZAAR) 100-25 MG per tablet Take 1 tablet by mouth daily.   Yes [provider]  meloxicam (MOBIC) 7.5 MG tablet Take 1 tablet (7.5 mg total) by mouth 2 (two) times daily. Patient taking differently: Take 7.5 mg by mouth 2 (two) times daily as needed for pain.  09/01/18  Yes Helane GuntherMayer, Gregory, DPM  naproxen sodium (ALEVE) 220 MG tablet Take 220 mg by mouth daily as needed (mild pain).   Yes [provider]  Oxycodone HCl 10 MG TABS Take 2.5 mg by mouth every 2 (two) hours as needed (pain).  03/02/18  Yes [provider]  polycarbophil (FIBERCON) 625 MG tablet Take 1,250 mg by mouth daily.   Yes [provider]  silodosin (RAPAFLO) 8 MG CAPS capsule Take 1 capsule (8 mg total) by mouth daily with breakfast. 09/11/18  Yes Jerilee FieldEskridge, Matthew, MD  zolpidem (AMBIEN) 10 MG tablet Take 5-10 mg by mouth at bedtime.    Yes [provider]  nitroGLYCERIN (NITROSTAT) 0.4 MG SL tablet Place 0.4 mg under the tongue every 5 (five) minutes as needed for chest pain.  03/27/17   [provider]     Positive ROS: All other systems have been reviewed and were otherwise negative with the exception of those mentioned in the HPI and as above.  Physical  Exam: General: Alert, no acute distress Cardiovascular: Regular rate and rhythm, no murmurs rubs or gallops.  No pedal edema Respiratory: Clear to auscultation bilaterally, no wheezes rales or rhonchi. No cyanosis, no use of accessory musculature GI: No organomegaly, abdomen is soft and non-tender nondistended with positive bowel sounds. Skin: Skin intact, no lesions within the operative field. Neurologic: Sensation intact distally Psychiatric: Patient is competent for consent with normal mood and affect Lymphatic: No cervical lymphadenopathy  MUSCULOSKELETAL: Right shoulder: Patient has pain with forward elevation and abduction to 90 degrees.  He  has weakness to downward directed force on his abducted shoulder and weakness to resisted external rotation.  Patient has full digital wrist and elbow range of motion, intact sensation light touch and a palpable radial pulse.  Assessment: M75.121 complete rotator cuff tear or rupture of right shoulder not specified as traumatic  Plan: Plan for Procedure(s): RIGHT SHOULDER ARTHROSCOPY WITH MINI-OPEN ROTATOR CUFF REPAIR  I have discussed the details of the operation as well as the postoperative course with the patient.  I also discussed the risks and benefits of surgery.  He understands the risks include but are not limited to infection, bleeding, nerve or blood vessel injury, joint stiffness or loss of motion, persistent pain, weakness or instability, re-tear of the rotator cuff, failure of the repair, hardware failure and the need for further surgery. Medical risks include but are not limited to DVT and pulmonary embolism, myocardial infarction, stroke, pneumonia, respiratory failure and death. Patient understood these risks and wished to proceed.     Juanell FairlyKevin Liviah Cake, MD   11/24/2018 7:49 AM

## 2018-11-25 NOTE — Anesthesia Postprocedure Evaluation (Signed)
Anesthesia Post Note  Patient: CHRISTEN BEDOYA  Procedure(s) Performed: SHOULDER ARTHROSCOPY WITH OPEN MINI ROTATOR CUFF REPAIR, SUBACROMIAL DECOMPRESSION, DISTAL CLAVICLE EXCISION, BICEPS TENODESIS (Right Shoulder)  Patient location during evaluation: PACU Anesthesia Type: General Level of consciousness: awake and alert Pain management: pain level controlled Vital Signs Assessment: post-procedure vital signs reviewed and stable Respiratory status: spontaneous breathing, nonlabored ventilation, respiratory function stable and patient connected to nasal cannula oxygen Cardiovascular status: blood pressure returned to baseline and stable Postop Assessment: no apparent nausea or vomiting Anesthetic complications: no     Last Vitals:  Vitals:   11/24/18 1233 11/24/18 1243  BP: 134/81   Pulse: 67 72  Resp: 12 14  Temp: 36.7 C 36.6 C  SpO2: 95% 96%    Last Pain:  Vitals:   11/24/18 1243  TempSrc: Temporal  PainSc: 3                  Martha Clan

## 2018-12-01 ENCOUNTER — Telehealth (INDEPENDENT_AMBULATORY_CARE_PROVIDER_SITE_OTHER): Payer: Medicare Other | Admitting: Cardiovascular Disease

## 2018-12-01 ENCOUNTER — Other Ambulatory Visit: Payer: Self-pay

## 2018-12-01 ENCOUNTER — Encounter: Payer: Self-pay | Admitting: Cardiovascular Disease

## 2018-12-01 VITALS — Ht 66.0 in | Wt 178.0 lb

## 2018-12-01 DIAGNOSIS — I2510 Atherosclerotic heart disease of native coronary artery without angina pectoris: Secondary | ICD-10-CM | POA: Diagnosis not present

## 2018-12-01 DIAGNOSIS — I10 Essential (primary) hypertension: Secondary | ICD-10-CM

## 2018-12-01 DIAGNOSIS — E785 Hyperlipidemia, unspecified: Secondary | ICD-10-CM

## 2018-12-01 MED ORDER — CLOPIDOGREL BISULFATE 75 MG PO TABS: 75.0000 mg | ORAL_TABLET | Freq: Every day | ORAL | 3 refills | Status: DC

## 2018-12-01 NOTE — Patient Instructions (Signed)
Medication Instructions:  Continue same medications If you need a refill on your cardiac medications before your next appointment, please call your pharmacy.   Lab work: None If you have labs (blood work) drawn today and your tests are completely normal, you will receive your results only by: . MyChart Message (if you have MyChart) OR . A paper copy in the mail If you have any lab test that is abnormal or we need to change your treatment, we will call you to review the results.  Testing/Procedures: None  Follow-Up: At [facility], you and your health needs are our priority.  As part of our continuing mission to provide you with exceptional heart care, we have created designated Provider Care Teams.  These Care Teams include your primary Cardiologist (physician) and Advanced Practice Providers (APPs -  Physician Assistants and Nurse Practitioners) who all work together to provide you with the care you need, when you need it. You will need a follow up appointment in 4 months.  Please call our office 2 months in advance to schedule this appointment.  You may see No primary care provider on file. or one of the following Advanced Practice Providers on your designated Care Team:   Christopher Berge, NP Ryan Dunn, PA-C Jacquelyn Visser, PA-C  

## 2018-12-01 NOTE — Progress Notes (Signed)
Virtual Visit via Video Note   This visit type was conducted due to national recommendations for restrictions regarding the COVID-19 Pandemic (e.g. social distancing) in an effort to limit this patient's exposure and mitigate transmission in our community.  Due to his co-morbid illnesses, this patient is at least at moderate risk for complications without adequate follow up.  This format is felt to be most appropriate for this patient at this time.  All issues noted in this document were discussed and addressed.  A limited physical exam was performed with this format.  Please refer to the patient's chart for his consent to telehealth for [facility].   Date:  12/01/2018   ID:  Terry Cook, DOB [birth year], MRN [id number]  Patient Location: Home Provider Location: Home  PCP:  Leighton Ruff, MD  Cardiologist:  No primary care provider on file.  Electrophysiologist:  None   Evaluation Performed:  Follow-Up Visit  Chief Complaint:  establish care.   History of Present Illness:    Terry Cook is a 68 y.o. male with who is here today to establish cardiovascular care.  His previous cardiologist was Dr. Wynonia Lawman who retired.  The patient has known history of coronary artery disease with previous myocardial infarction.  He had stent placement to the left circumflex and was found at that time to have a chronically occluded RCA with left-to-right collaterals.  He presented in 2015 with unstable angina and was found to have patent left circumflex stent but there was 90% stenosis in the mid LAD which was treated successfully with PCI and drug-eluting stent placement.  No cardiac events since then.  He has strong family history of premature coronary artery disease.  His other chronic medical conditions include essential hypertension, hyperlipidemia, arthritis and chronic low back pain on oxycodone. He has been doing extremely well with no recent chest pain, shortness of breath or palpitations.   He takes his medications regularly.  Occasionally he has fluctuations with blood pressure.  His blood pressure tends to be higher in the evening.  He takes both antihypertensive medications in the morning.  He stays active and exercises with no exertional symptoms.  The patient does not have symptoms concerning for COVID-19 infection (fever, chills, cough, or new shortness of breath).    Past Medical History:  Diagnosis Date  . Anxiety   . CAD (coronary artery disease), native coronary artery 06/04/2011   s/p MI with PCI to left circ  . Chronic lower back pain   . DDD (degenerative disc disease), lumbar   . Depression   . High cholesterol   . History of kidney stones   . Hypertension   . Irregular heart beats   . MI (myocardial infarction) ([place]) 05/2011  . OSA on CPAP    Past Surgical History:  Procedure Laterality Date  . COLONOSCOPY    . CORONARY ANGIOPLASTY WITH STENT PLACEMENT  05/2011   "1"  . CYST EXCISION Right ~ 2009   "index finger; in office"  . CYSTOSCOPY W/ STONE MANIPULATION  ~ 1975 X 2  . LEFT HEART CATHETERIZATION WITH CORONARY ANGIOGRAM N/A 06/04/2011   Procedure: LEFT HEART CATHETERIZATION WITH CORONARY ANGIOGRAM;  Surgeon: Jacolyn Reedy, MD;  Location: [facility] CATH LAB;  Service: Cardiovascular;  Laterality: N/A;  . LEFT HEART CATHETERIZATION WITH CORONARY ANGIOGRAM N/A 11/18/2013   Procedure: LEFT HEART CATHETERIZATION WITH CORONARY ANGIOGRAM;  Surgeon: Sinclair Grooms, MD;  Location: [facility] CATH LAB;  Service: Cardiovascular;  Laterality: N/A;  .  PERCUTANEOUS CORONARY STENT INTERVENTION (PCI-S)  11/18/2013   Procedure: PERCUTANEOUS CORONARY STENT INTERVENTION (PCI-S);  Surgeon: Lesleigh Noe, MD;  Location: [facility] CATH LAB;  Service: Cardiovascular;;  . SHOULDER ARTHROSCOPY WITH OPEN ROTATOR CUFF REPAIR Right 11/24/2018   Procedure: SHOULDER ARTHROSCOPY WITH OPEN MINI ROTATOR CUFF REPAIR, SUBACROMIAL DECOMPRESSION, DISTAL CLAVICLE EXCISION, BICEPS TENODESIS;  Surgeon:  Juanell Fairly, MD;  Location: [facility] ORS;  Service: Orthopedics;  Laterality: Right;     Current Meds  Medication Sig  . ALPRAZolam (XANAX) 0.5 MG tablet Take 0.5 mg by mouth 2 (two) times daily as needed for anxiety.  Marland Kitchen amLODipine (NORVASC) 10 MG tablet Take 10 mg by mouth daily.  Marland Kitchen aspirin EC 81 MG tablet Take 81 mg by mouth daily.  Marland Kitchen atorvastatin (LIPITOR) 80 MG tablet Take 1 tablet (80 mg total) by mouth daily. (Patient taking differently: Take 80 mg by mouth every evening. )  . calcium carbonate (TUMS - DOSED IN MG ELEMENTAL CALCIUM) 500 MG chewable tablet Chew 1 tablet by mouth daily as needed for indigestion or heartburn.  . cetirizine (ZYRTEC) 10 MG tablet Take 10 mg by mouth daily as needed for allergies.  Marland Kitchen clopidogrel (PLAVIX) 75 MG tablet Take 75 mg by mouth daily.  . diclofenac sodium (VOLTAREN) 1 % GEL Apply 1 application topically daily. Apply to feet  . ibuprofen (ADVIL) 200 MG tablet Take 200 mg by mouth every 6 (six) hours as needed for moderate pain.  Marland Kitchen losartan-hydrochlorothiazide (HYZAAR) 100-25 MG per tablet Take 1 tablet by mouth daily.  . nitroGLYCERIN (NITROSTAT) 0.4 MG SL tablet Place 0.4 mg under the tongue every 5 (five) minutes as needed for chest pain.   Marland Kitchen oxyCODONE (OXY IR/ROXICODONE) 5 MG immediate release tablet Take 1 tablet (5 mg total) by mouth every 4 (four) hours as needed.  . polycarbophil (FIBERCON) 625 MG tablet Take 1,250 mg by mouth daily.  . silodosin (RAPAFLO) 8 MG CAPS capsule Take 1 capsule (8 mg total) by mouth daily with breakfast.  . zolpidem (AMBIEN) 10 MG tablet Take 5-10 mg by mouth at bedtime.      Allergies:   Patient has no known allergies.   Social History   Tobacco Use  . Smoking status: Never Smoker  . Smokeless tobacco: Never Used  Substance Use Topics  . Alcohol use: Not Currently    Alcohol/week: 2.0 standard drinks    Types: 2 Glasses of wine per week  . Drug use: No     Family Hx: The patient's family history  includes Coronary artery disease (age of onset: 69) in his father; Coronary artery disease (age of onset: 54) in his brother; Kidney cancer in his brother; Lupus in his daughter; Non-Hodgkin's lymphoma in his mother.  ROS:   Please see the history of present illness.     All other systems reviewed and are negative.   Prior CV studies:   The following studies were reviewed today:  I reviewed cardiac catheterization report from 2015.  Labs/Other Tests and Data Reviewed:    EKG:  No ECG reviewed.  Recent Labs: 11/23/2018: BUN 17; Creatinine, Ser 0.88; Hemoglobin 14.8; Platelets 117; Potassium 3.6; Sodium 142   Recent Lipid Panel Lab Results  Component Value Date/Time   CHOL 139 11/18/2013 02:46 AM   TRIG 190 (H) 11/18/2013 02:46 AM   HDL 38 (L) 11/18/2013 02:46 AM   CHOLHDL 3.7 11/18/2013 02:46 AM   LDLCALC 63 11/18/2013 02:46 AM    Wt Readings from Last 3  Encounters:  12/01/18 178 lb (80.7 kg)  11/24/18 182 lb (82.6 kg)  11/23/18 182 lb (82.6 kg)     Objective:    Vital Signs:  Ht 5\' 6"  (1.676 m)   Wt 178 lb (80.7 kg)   BMI 28.73 kg/m    VITAL SIGNS:  reviewed GEN:  no acute distress EYES:  sclerae anicteric, EOMI - Extraocular Movements Intact RESPIRATORY:  normal respiratory effort, symmetric expansion SKIN:  no rash, lesions or ulcers. MUSCULOSKELETAL:  no obvious deformities. NEURO:  alert and oriented x 3, no obvious focal deficit PSYCH:  normal affect  ASSESSMENT & PLAN:    1. Coronary artery disease involving native coronary arteries without angina: He is doing extremely well with no anginal symptoms.  Continue medical therapy.  Given stents of both LAD and left circumflex, I think it makes sense to continue dual antiplatelet therapy for now as tolerated.  He reports most recent stress test last year which was unremarkable. 2. Hyperlipidemia: Continue treatment with atorvastatin with a target LDL of less than 70. 3. Essential hypertension: I suggested  spacing his antihypertensive medications to see if that helps with fluctuations.  COVID-19 Education: The signs and symptoms of COVID-19 were discussed with the patient and how to seek care for testing (follow up with PCP or arrange E-visit).  The importance of social distancing was discussed today.  Time:   Today, I have spent 12 minutes with the patient with telehealth technology discussing the above problems.     Medication Adjustments/Labs and Tests Ordered: Current medicines are reviewed at length with the patient today.  Concerns regarding medicines are outlined above.   Tests Ordered: No orders of the defined types were placed in this encounter.   Medication Changes: No orders of the defined types were placed in this encounter.   Follow Up:  In Person in 4 month(s)  Signed, Lorine BearsMuhammad , MD  12/01/2018 2:31 PM    [facility]

## 2018-12-15 DIAGNOSIS — M5030 Other cervical disc degeneration, unspecified cervical region: Secondary | ICD-10-CM | POA: Diagnosis not present

## 2018-12-15 DIAGNOSIS — M47816 Spondylosis without myelopathy or radiculopathy, lumbar region: Secondary | ICD-10-CM | POA: Diagnosis not present

## 2018-12-16 ENCOUNTER — Other Ambulatory Visit: Payer: Self-pay

## 2018-12-16 ENCOUNTER — Ambulatory Visit: Payer: Medicare Other | Admitting: Urology

## 2018-12-16 ENCOUNTER — Encounter: Payer: Self-pay | Admitting: Urology

## 2018-12-16 VITALS — BP 101/63 | HR 70 | Ht 66.0 in | Wt 177.0 lb

## 2018-12-16 DIAGNOSIS — N401 Enlarged prostate with lower urinary tract symptoms: Secondary | ICD-10-CM

## 2018-12-16 DIAGNOSIS — R3912 Poor urinary stream: Secondary | ICD-10-CM | POA: Diagnosis not present

## 2018-12-16 DIAGNOSIS — N138 Other obstructive and reflux uropathy: Secondary | ICD-10-CM

## 2018-12-16 NOTE — Progress Notes (Signed)
12/16/2018 2:34 PM   Terry Cook 05/01/1950 [id number]  Referring provider: Leighton Ruff, MD [place],  [place] [address]  Chief Complaint  Patient presents with  . Benign Prostatic Hypertrophy    31month    HPI:   F/u -   1) BPH, weak stream and straining to void x 30 years. Prostate not all thatlarge onpriorexam. I saw him in [place] Oct 2018 and PSa was 1.47. His AUASS was 24. Hestartedalfuzosinwhich helpsa lot, but if he misses a dose his stream weakens. He has hesitancy. Weak stream. Needs to double void. Doesn't feel empty. He has no frequency or urgency. No incontinence. AUASS was 19 (off meds). Noc x 3.He has a history of sleep apnea, back painand coronary artery disease. No gross hematuria.He tried alfuzosin again. PSA was 1.7. Cystoscopy benign on 09/11/2018. TURP or Urolift candidate.  NG risk - extensive back surgery/pain. No urologic surgery. Tried tamsulosin remotely and recalled trouble with ED. Renal US was normal 08/18/2018.   Past medical history significant for CAD/PCI/Plavix, nitroglycerin use, CPAP at night. Also history of thrombocytopenia and sees Dr. Lebron Conners. BUN was 10.5 and creatinine 0.14 November 2016. He has severe back pain and takes oycodone daily. His brother had kidney cancer.  He returns and tried silodosin 8 mg. He is doing better and is satisfied with his flow. He had right rotator cuff surgery that took 8 hrs. His urinary problem has "gone away".    PMH: Past Medical History:  Diagnosis Date  . Anxiety   . CAD (coronary artery disease), native coronary artery 06/04/2011   s/p MI with PCI to left circ  . Chronic lower back pain   . DDD (degenerative disc disease), lumbar   . Depression   . High cholesterol   . History of kidney stones   . Hypertension   . Irregular heart beats   . MI (myocardial infarction) ([place]) 05/2011  . OSA on CPAP     Surgical History: Past Surgical History:  Procedure Laterality Date   . COLONOSCOPY    . CORONARY ANGIOPLASTY WITH STENT PLACEMENT  05/2011   "1"  . CYST EXCISION Right ~ 2009   "index finger; in office"  . CYSTOSCOPY W/ STONE MANIPULATION  ~ 1975 X 2  . LEFT HEART CATHETERIZATION WITH CORONARY ANGIOGRAM N/A 06/04/2011   Procedure: LEFT HEART CATHETERIZATION WITH CORONARY ANGIOGRAM;  Surgeon: Jacolyn Reedy, MD;  Location: [facility] CATH LAB;  Service: Cardiovascular;  Laterality: N/A;  . LEFT HEART CATHETERIZATION WITH CORONARY ANGIOGRAM N/A 11/18/2013   Procedure: LEFT HEART CATHETERIZATION WITH CORONARY ANGIOGRAM;  Surgeon: Sinclair Grooms, MD;  Location: [facility] CATH LAB;  Service: Cardiovascular;  Laterality: N/A;  . PERCUTANEOUS CORONARY STENT INTERVENTION (PCI-S)  11/18/2013   Procedure: PERCUTANEOUS CORONARY STENT INTERVENTION (PCI-S);  Surgeon: Sinclair Grooms, MD;  Location: [facility] CATH LAB;  Service: Cardiovascular;;  . SHOULDER ARTHROSCOPY WITH OPEN ROTATOR CUFF REPAIR Right 11/24/2018   Procedure: SHOULDER ARTHROSCOPY WITH OPEN MINI ROTATOR CUFF REPAIR, SUBACROMIAL DECOMPRESSION, DISTAL CLAVICLE EXCISION, BICEPS TENODESIS;  Surgeon: Thornton Park, MD;  Location: [facility] ORS;  Service: Orthopedics;  Laterality: Right;    Home Medications:  Allergies as of 12/16/2018   No Known Allergies     Medication List       Accurate as of December 16, 2018  2:34 PM. If you have any questions, ask your nurse or doctor.        ALPRAZolam 0.5 MG tablet Commonly known as:  XANAX Take 0.5 mg by mouth 2 (two) times daily as needed for anxiety.   amLODipine 10 MG tablet Commonly known as: NORVASC Take 10 mg by mouth daily.   aspirin EC 81 MG tablet Take 81 mg by mouth daily.   atorvastatin 80 MG tablet Commonly known as: LIPITOR Take 1 tablet (80 mg total) by mouth daily. What changed: when to take this   calcium carbonate 500 MG chewable tablet Commonly known as: TUMS - dosed in mg elemental calcium Chew 1 tablet by mouth daily as needed for indigestion or  heartburn.   cetirizine 10 MG tablet Commonly known as: ZYRTEC Take 10 mg by mouth daily as needed for allergies.   clopidogrel 75 MG tablet Commonly known as: PLAVIX Take 1 tablet (75 mg total) by mouth daily.   diclofenac sodium 1 % Gel Commonly known as: VOLTAREN Apply 1 application topically daily. Apply to feet   ibuprofen 200 MG tablet Commonly known as: ADVIL Take 200 mg by mouth every 6 (six) hours as needed for moderate pain.   losartan-hydrochlorothiazide 100-25 MG tablet Commonly known as: HYZAAR Take 1 tablet by mouth daily.   nitroGLYCERIN 0.4 MG SL tablet Commonly known as: NITROSTAT Place 0.4 mg under the tongue every 5 (five) minutes as needed for chest pain.   oxyCODONE 5 MG immediate release tablet Commonly known as: Oxy IR/ROXICODONE Take 1 tablet (5 mg total) by mouth every 4 (four) hours as needed.   polycarbophil 625 MG tablet Commonly known as: FIBERCON Take 1,250 mg by mouth daily.   silodosin 8 MG Caps capsule Commonly known as: RAPAFLO Take 1 capsule (8 mg total) by mouth daily with breakfast.   zolpidem 10 MG tablet Commonly known as: AMBIEN Take 5-10 mg by mouth at bedtime.       Allergies: No Known Allergies  Family History: Family History  Problem Relation Age of Onset  . Coronary artery disease Father 12  . Coronary artery disease Brother 41  . Non-Hodgkin's lymphoma Mother        Had a "spleen cancer" and died from it when the patient was 68yo  . Kidney cancer Brother   . Lupus Daughter     Social History:  reports that he has never smoked. He has never used smokeless tobacco. He reports previous alcohol use of about 2.0 standard drinks of alcohol per week. He reports that he does not use drugs.  ROS:                                        Physical Exam: There were no vitals taken for this visit.  Constitutional:  Alert and oriented, No acute distress. HEENT: [place] AT, moist mucus membranes.   Trachea midline, no masses. Cardiovascular: No clubbing, cyanosis, or edema. Respiratory: Normal respiratory effort, no increased work of breathing. GI: Abdomen is soft, nontender, nondistended, no abdominal masses GU: No CVA tenderness Lymph: No cervical or inguinal lymphadenopathy. Skin: No rashes, bruises or suspicious lesions. Neurologic: Grossly intact, no focal deficits, moving all 4 extremities. Psychiatric: Normal mood and affect.  Laboratory Data: Lab Results  Component Value Date   WBC 6.7 11/23/2018   HGB 14.8 11/23/2018   HCT 42.3 11/23/2018   MCV 88.9 11/23/2018   PLT 117 (L) 11/23/2018    Lab Results  Component Value Date   CREATININE 0.88 11/23/2018    No results found for:  PSA  No results found for: TESTOSTERONE  No results found for: HGBA1C  Urinalysis    Component Value Date/Time   APPEARANCEUR Clear 09/11/2018 1019   GLUCOSEU Negative 09/11/2018 1019   BILIRUBINUR Negative 09/11/2018 1019   PROTEINUR Negative 09/11/2018 1019   NITRITE Negative 09/11/2018 1019   LEUKOCYTESUR Negative 09/11/2018 1019    Lab Results  Component Value Date   LABMICR See below: 09/11/2018   WBCUA 0-5 09/11/2018   LABEPIT None seen 09/11/2018   MUCUS Present (A) 09/11/2018   BACTERIA None seen 09/11/2018    Pertinent Imaging: n/a No results found for this or any previous visit. No results found for this or any previous visit. No results found for this or any previous visit. No results found for this or any previous visit. Results for orders placed during the hospital encounter of 08/18/18  US RENAL   Narrative CLINICAL DATA:  Initial evaluation for chronic back pain with weak stream.  EXAM: RENAL / URINARY TRACT ULTRASOUND COMPLETE  COMPARISON:  None available.  FINDINGS: Right Kidney:  Renal measurements: 11.7 x 5.3 x 4.5 cm = volume: 146.4 mL . Echogenicity within normal limits. No mass or hydronephrosis visualized.  Left Kidney:  Renal  measurements: 12.4 x 5.8 x 5.2 cm = volume: 196.0 mL. Echogenicity within normal limits. Left kidney demonstrates a mildly lobulated contour. No hydronephrosis. 8 x 9 x 6 mm simple exophytic cyst extends from the upper pole of the left kidney. No internal vascularity.  Bladder:  Appears normal for degree of bladder distention.  IMPRESSION: 1. 9 mm simple exophytic cyst at the upper pole of the left kidney. 2. Otherwise unremarkable and normal renal ultrasound. No hydronephrosis or other acute finding.   Electronically Signed   By: Rise MuBenjamin  McClintock [name]   On: 08/18/2018 22:08    No results found for this or any previous visit. No results found for this or any previous visit. No results found for this or any previous visit.  Assessment & Plan:    Bph, weak stream - doing well and he is pleased. See back in 1 year or sooner if issues.   No follow-ups on file.  Jerilee FieldMatthew Reece Mcbroom, MD  [facility] [address], [address] [place], [place] [address] [phone]

## 2018-12-16 NOTE — Patient Instructions (Signed)

## 2019-01-11 DIAGNOSIS — Z23 Encounter for immunization: Secondary | ICD-10-CM | POA: Diagnosis not present

## 2019-01-29 NOTE — Progress Notes (Signed)
Silodosin 8mg  prior authorization submitted via covermymeds.  Waiting response.

## 2019-02-01 NOTE — Progress Notes (Signed)
Silodosin was approved via fax from [place].

## 2019-03-01 DIAGNOSIS — F332 Major depressive disorder, recurrent severe without psychotic features: Secondary | ICD-10-CM | POA: Diagnosis not present

## 2019-03-02 DIAGNOSIS — D696 Thrombocytopenia, unspecified: Secondary | ICD-10-CM | POA: Diagnosis not present

## 2019-03-02 DIAGNOSIS — E7800 Pure hypercholesterolemia, unspecified: Secondary | ICD-10-CM | POA: Diagnosis not present

## 2019-03-02 DIAGNOSIS — F419 Anxiety disorder, unspecified: Secondary | ICD-10-CM | POA: Diagnosis not present

## 2019-03-02 DIAGNOSIS — F339 Major depressive disorder, recurrent, unspecified: Secondary | ICD-10-CM | POA: Diagnosis not present

## 2019-03-09 DIAGNOSIS — I10 Essential (primary) hypertension: Secondary | ICD-10-CM | POA: Diagnosis not present

## 2019-03-09 DIAGNOSIS — M5030 Other cervical disc degeneration, unspecified cervical region: Secondary | ICD-10-CM | POA: Diagnosis not present

## 2019-03-09 DIAGNOSIS — Z6828 Body mass index (BMI) 28.0-28.9, adult: Secondary | ICD-10-CM | POA: Diagnosis not present

## 2019-03-09 DIAGNOSIS — M5136 Other intervertebral disc degeneration, lumbar region: Secondary | ICD-10-CM | POA: Diagnosis not present

## 2019-03-16 DIAGNOSIS — F332 Major depressive disorder, recurrent severe without psychotic features: Secondary | ICD-10-CM | POA: Diagnosis not present

## 2019-03-22 MED ORDER — ATORVASTATIN CALCIUM 80 MG PO TABS: 80.0000 mg | ORAL_TABLET | Freq: Every evening | ORAL | 2 refills | Status: DC

## 2019-03-22 MED ORDER — LOSARTAN POTASSIUM-HCTZ 100-25 MG PO TABS: 1.0000 | ORAL_TABLET | Freq: Every day | ORAL | 2 refills | Status: DC

## 2019-03-22 MED ORDER — AMLODIPINE BESYLATE 10 MG PO TABS: 10.0000 mg | ORAL_TABLET | Freq: Every day | ORAL | 2 refills | Status: DC

## 2019-03-22 MED ORDER — NITROGLYCERIN 0.4 MG SL SUBL: 0.4000 mg | SUBLINGUAL_TABLET | SUBLINGUAL | 1 refills | Status: DC | PRN

## 2019-03-25 DIAGNOSIS — Z0000 Encounter for general adult medical examination without abnormal findings: Secondary | ICD-10-CM | POA: Diagnosis not present

## 2019-03-30 DIAGNOSIS — F332 Major depressive disorder, recurrent severe without psychotic features: Secondary | ICD-10-CM | POA: Diagnosis not present

## 2019-05-29 ENCOUNTER — Ambulatory Visit: Payer: Medicare Other | Attending: Internal Medicine

## 2019-05-29 DIAGNOSIS — Z23 Encounter for immunization: Secondary | ICD-10-CM | POA: Insufficient documentation

## 2019-05-29 NOTE — Progress Notes (Signed)
   Covid-19 Vaccination Clinic  Name:  Terry Cook    MRN: [id number] DOB: 05/27/1950  05/29/2019  [name] was observed post Covid-19 immunization for 15 minutes without incidence. He was provided with Vaccine Information Sheet and instruction to access the V-Safe system.   [name] was instructed to call 911 with any severe reactions post vaccine: Marland Kitchen Difficulty breathing  . Swelling of your face and throat  . A fast heartbeat  . A bad rash all over your body  . Dizziness and weakness    Immunizations Administered    Name Date Dose VIS Date Route   Pfizer COVID-19 Vaccine 05/29/2019  3:21 PM 0.3 mL 03/19/2019 Intramuscular   Manufacturer: [employer], [employer]   Lot: [id number]   NDC: [address]-7

## 2019-06-07 DIAGNOSIS — M5136 Other intervertebral disc degeneration, lumbar region: Secondary | ICD-10-CM | POA: Diagnosis not present

## 2019-06-07 DIAGNOSIS — I10 Essential (primary) hypertension: Secondary | ICD-10-CM | POA: Diagnosis not present

## 2019-06-07 DIAGNOSIS — M5030 Other cervical disc degeneration, unspecified cervical region: Secondary | ICD-10-CM | POA: Diagnosis not present

## 2019-06-07 DIAGNOSIS — M5412 Radiculopathy, cervical region: Secondary | ICD-10-CM | POA: Diagnosis not present

## 2019-06-21 DIAGNOSIS — G4700 Insomnia, unspecified: Secondary | ICD-10-CM | POA: Diagnosis not present

## 2019-06-21 DIAGNOSIS — I10 Essential (primary) hypertension: Secondary | ICD-10-CM | POA: Diagnosis not present

## 2019-06-21 DIAGNOSIS — I2510 Atherosclerotic heart disease of native coronary artery without angina pectoris: Secondary | ICD-10-CM | POA: Diagnosis not present

## 2019-06-21 DIAGNOSIS — F339 Major depressive disorder, recurrent, unspecified: Secondary | ICD-10-CM | POA: Diagnosis not present

## 2019-06-22 ENCOUNTER — Ambulatory Visit: Payer: Medicare Other | Attending: Internal Medicine

## 2019-06-22 DIAGNOSIS — Z23 Encounter for immunization: Secondary | ICD-10-CM

## 2019-06-22 NOTE — Progress Notes (Signed)
   Covid-19 Vaccination Clinic  Name:  Terry Cook    MRN: [id number] DOB: 10/05/1950  06/22/2019  [name] was observed post Covid-19 immunization for 15 minutes without incident. He was provided with Vaccine Information Sheet and instruction to access the V-Safe system.   [name] was instructed to call 911 with any severe reactions post vaccine: Marland Kitchen Difficulty breathing  . Swelling of face and throat  . A fast heartbeat  . A bad rash all over body  . Dizziness and weakness   Immunizations Administered    Name Date Dose VIS Date Route   Pfizer COVID-19 Vaccine 06/22/2019 12:40 PM 0.3 mL 03/19/2019 Intramuscular   Manufacturer: [employer], [employer]   Lot: [id number]   NDC: [address]-9

## 2019-06-23 DIAGNOSIS — H8112 Benign paroxysmal vertigo, left ear: Secondary | ICD-10-CM | POA: Diagnosis not present

## 2019-06-23 DIAGNOSIS — R42 Dizziness and giddiness: Secondary | ICD-10-CM | POA: Diagnosis not present

## 2019-08-02 DIAGNOSIS — M5412 Radiculopathy, cervical region: Secondary | ICD-10-CM | POA: Insufficient documentation

## 2019-09-07 ENCOUNTER — Other Ambulatory Visit: Payer: Self-pay | Admitting: Podiatry

## 2019-09-14 DIAGNOSIS — M5136 Other intervertebral disc degeneration, lumbar region: Secondary | ICD-10-CM | POA: Diagnosis not present

## 2019-09-27 DIAGNOSIS — H2513 Age-related nuclear cataract, bilateral: Secondary | ICD-10-CM | POA: Diagnosis not present

## 2019-09-27 DIAGNOSIS — H5212 Myopia, left eye: Secondary | ICD-10-CM | POA: Diagnosis not present

## 2019-09-27 DIAGNOSIS — H40023 Open angle with borderline findings, high risk, bilateral: Secondary | ICD-10-CM | POA: Diagnosis not present

## 2019-09-27 DIAGNOSIS — H524 Presbyopia: Secondary | ICD-10-CM | POA: Diagnosis not present

## 2019-10-14 ENCOUNTER — Other Ambulatory Visit: Payer: Self-pay | Admitting: *Deleted

## 2019-10-14 ENCOUNTER — Other Ambulatory Visit: Payer: Self-pay | Admitting: Cardiovascular Disease

## 2019-10-14 MED ORDER — LOSARTAN POTASSIUM-HCTZ 100-25 MG PO TABS: 1.0000 | ORAL_TABLET | Freq: Every day | ORAL | 0 refills | Status: DC

## 2019-10-14 MED ORDER — AMLODIPINE BESYLATE 10 MG PO TABS: 10.0000 mg | ORAL_TABLET | Freq: Every day | ORAL | 0 refills | Status: DC

## 2019-10-14 MED ORDER — SILODOSIN 8 MG PO CAPS: 8.0000 mg | ORAL_CAPSULE | Freq: Every day | ORAL | 1 refills | Status: DC

## 2019-10-14 MED ORDER — ATORVASTATIN CALCIUM 80 MG PO TABS: 80.0000 mg | ORAL_TABLET | Freq: Every evening | ORAL | 0 refills | Status: DC

## 2019-10-14 NOTE — Telephone Encounter (Signed)
Spoke with patient and he would like to proceed with this rx which means prior auth, will wait on form at [employer].  rx sent to pharmacy by e-script

## 2019-10-14 NOTE — Telephone Encounter (Signed)
Requested Prescriptions   Signed Prescriptions Disp Refills   amLODipine (NORVASC) 10 MG tablet 90 tablet 0    Sig: Take 1 tablet (10 mg total) by mouth daily.    Authorizing Provider: Lorine Bears A    Ordering User: Iverson Alamin C   atorvastatin (LIPITOR) 80 MG tablet 90 tablet 0    Sig: Take 1 tablet (80 mg total) by mouth every evening.    Authorizing Provider: Lorine Bears A    Ordering User: Kendrick Fries   losartan-hydrochlorothiazide (HYZAAR) 100-25 MG tablet 90 tablet 0    Sig: Take 1 tablet by mouth daily.    Authorizing Provider: Lorine Bears A    Ordering User: Kendrick Fries

## 2019-10-14 NOTE — Telephone Encounter (Signed)
°*  STAT* If patient is at the pharmacy, call can be transferred to refill team.   1. Which medications need to be refilled? (please list name of each medication and dose if known)    Amlodipine 10 mg po q d  Atorvastatin 80 mg po q d Losartan-hctz 100-25 mg po q d    2. Which pharmacy/location (including street and city if local pharmacy) is medication to be sent to?  [facility]   3. Do they need a 30 day or 90 day supply? 90

## 2019-10-14 NOTE — Telephone Encounter (Addendum)
Pt calling asking for refill or Rapaflo and per pt Karin Golden [place] said we denied the rx? What actually happened is its not on his formulary. Please see message below      .left message to have patient return my call.   Why Am I Seeing These Alternatives?   Reordered from silodosin (RAPAFLO) 8 MG CAPS capsule is not on the preferred formulary for the patient's insurance plan. Below are alternatives which are likely to be more affordable. Do not assume that every medication presented is a clinically appropriate alternative.  These alternatives are medications that are in the same pharmaceutical subclass (Prostatic Hypertrophy Agents) as the ordered medication and are on formulary for the patient's insurance plan.   Finasteride 5mg  Dutasteride 0.5mg  Alfuzosin HCL ER 10mg 

## 2019-10-15 ENCOUNTER — Encounter: Payer: Self-pay | Admitting: Cardiovascular Disease

## 2019-10-15 DIAGNOSIS — D696 Thrombocytopenia, unspecified: Secondary | ICD-10-CM | POA: Diagnosis not present

## 2019-10-15 DIAGNOSIS — E7800 Pure hypercholesterolemia, unspecified: Secondary | ICD-10-CM | POA: Diagnosis not present

## 2019-10-21 ENCOUNTER — Encounter: Payer: Self-pay | Admitting: Cardiovascular Disease

## 2019-10-21 ENCOUNTER — Ambulatory Visit (INDEPENDENT_AMBULATORY_CARE_PROVIDER_SITE_OTHER): Payer: Medicare Other | Admitting: Cardiovascular Disease

## 2019-10-21 ENCOUNTER — Other Ambulatory Visit: Payer: Self-pay

## 2019-10-21 VITALS — BP 158/82 | HR 69 | Ht 66.0 in | Wt 188.1 lb

## 2019-10-21 DIAGNOSIS — I10 Essential (primary) hypertension: Secondary | ICD-10-CM | POA: Diagnosis not present

## 2019-10-21 DIAGNOSIS — I2510 Atherosclerotic heart disease of native coronary artery without angina pectoris: Secondary | ICD-10-CM | POA: Diagnosis not present

## 2019-10-21 DIAGNOSIS — E785 Hyperlipidemia, unspecified: Secondary | ICD-10-CM

## 2019-10-21 DIAGNOSIS — I493 Ventricular premature depolarization: Secondary | ICD-10-CM | POA: Diagnosis not present

## 2019-10-21 NOTE — Patient Instructions (Signed)
Medication Instructions:  No changes  *If you need a refill on your cardiac medications before your next appointment, please call your pharmacy*   Lab Work: None  If you have labs (blood work) drawn today and your tests are completely normal, you will receive your results only by: Marland Kitchen MyChart Message (if you have MyChart) OR . A paper copy in the mail If you have any lab test that is abnormal or we need to change your treatment, we will call you to review the results.   Testing/Procedures: None   Follow-Up: At [facility], you and your health needs are our priority.  As part of our continuing mission to provide you with exceptional heart care, we have created designated Provider Care Teams.  These Care Teams include your primary Cardiologist (physician) and Advanced Practice Providers (APPs -  Physician Assistants and Nurse Practitioners) who all work together to provide you with the care you need, when you need it.  We recommend signing up for the patient portal called "MyChart".  Sign up information is provided on this After Visit Summary.  MyChart is used to connect with patients for Virtual Visits (Telemedicine).  Patients are able to view lab/test results, encounter notes, upcoming appointments, etc.  Non-urgent messages can be sent to your provider as well.   To learn more about what you can do with MyChart, go to [email or web address].    Your next appointment:   6 month(s)  The format for your next appointment:   In Person  Provider:    You may see Dr. Lorine Bears or one of the following Advanced Practice Providers on your designated Care Team:    Nicolasa Ducking, NP  Eula Listen, PA-C  Marisue Ivan, PA-C

## 2019-10-21 NOTE — Progress Notes (Signed)
Cardiology Office Note   Date:  10/21/2019   ID:  Terry Cook, DOB 1950/04/10, MRN [id number]  PCP:  Juluis Rainier, MD  Cardiologist:   Lorine Bears, MD   Chief Complaint  Patient presents with  . OTHER    OD 4 month fu no complaints today . Meds reviewed verbally with pt.      History of Present Illness: Terry Cook is a 69 y.o. male who presents for a follow-up visit regarding coronary artery disease. The patient has known history of coronary artery disease with previous myocardial infarction.  He had stent placement to the left circumflex and was found at that time to have a chronically occluded RCA with left-to-right collaterals.  He presented in 2015 with unstable angina and was found to have patent left circumflex stent but there was 90% stenosis in the mid LAD which was treated successfully with PCI and drug-eluting stent placement.  No cardiac events since then.  He has strong family history of premature coronary artery disease.  His other chronic medical conditions include essential hypertension, hyperlipidemia, arthritis and chronic low back pain on oxycodone.  He also has known history of severe sleep apnea on CPAP. He has been doing well with no recent chest pain, shortness of breath or palpitations.  He had blood work done recently with his primary care physician and his cholesterol was elevated.  However, he reports that he was out of atorvastatin for 1 month.  His blood pressure is elevated today but he mentions that he was until 5:00 in the morning and did not take his antihypertensive medication until 1 hour ago.  His blood pressure is controlled at home.    Past Medical History:  Diagnosis Date  . Anxiety   . CAD (coronary artery disease), native coronary artery 06/04/2011   s/p MI with PCI to left circ  . Chronic lower back pain   . DDD (degenerative disc disease), lumbar   . Depression   . High cholesterol   . History of kidney stones   .  Hypertension   . Irregular heart beats   . MI (myocardial infarction) ([facility]) 05/2011  . OSA on CPAP     Past Surgical History:  Procedure Laterality Date  . COLONOSCOPY    . CORONARY ANGIOPLASTY WITH STENT PLACEMENT  05/2011   "1"  . CYST EXCISION Right ~ 2009   "index finger; in office"  . CYSTOSCOPY W/ STONE MANIPULATION  ~ 1975 X 2  . LEFT HEART CATHETERIZATION WITH CORONARY ANGIOGRAM N/A 06/04/2011   Procedure: LEFT HEART CATHETERIZATION WITH CORONARY ANGIOGRAM;  Surgeon: Othella Boyer, MD;  Location: [facility] CATH LAB;  Service: Cardiovascular;  Laterality: N/A;  . LEFT HEART CATHETERIZATION WITH CORONARY ANGIOGRAM N/A 11/18/2013   Procedure: LEFT HEART CATHETERIZATION WITH CORONARY ANGIOGRAM;  Surgeon: Lesleigh Noe, MD;  Location: [facility] CATH LAB;  Service: Cardiovascular;  Laterality: N/A;  . PERCUTANEOUS CORONARY STENT INTERVENTION (PCI-S)  11/18/2013   Procedure: PERCUTANEOUS CORONARY STENT INTERVENTION (PCI-S);  Surgeon: Lesleigh Noe, MD;  Location: [facility] CATH LAB;  Service: Cardiovascular;;  . SHOULDER ARTHROSCOPY WITH OPEN ROTATOR CUFF REPAIR Right 11/24/2018   Procedure: SHOULDER ARTHROSCOPY WITH OPEN MINI ROTATOR CUFF REPAIR, SUBACROMIAL DECOMPRESSION, DISTAL CLAVICLE EXCISION, BICEPS TENODESIS;  Surgeon: Juanell Fairly, MD;  Location: [facility] ORS;  Service: Orthopedics;  Laterality: Right;     Current Outpatient Medications  Medication Sig Dispense Refill  . ALPRAZolam (XANAX) 0.5 MG tablet Take 0.5 mg by  mouth 2 (two) times daily as needed for anxiety.    Marland Kitchen amLODipine (NORVASC) 10 MG tablet Take 1 tablet (10 mg total) by mouth daily. 90 tablet 0  . aspirin EC 81 MG tablet Take 81 mg by mouth daily.    Marland Kitchen atorvastatin (LIPITOR) 80 MG tablet Take 1 tablet (80 mg total) by mouth every evening. 90 tablet 0  . clopidogrel (PLAVIX) 75 MG tablet Take 1 tablet (75 mg total) by mouth daily. 90 tablet 3  . diclofenac sodium (VOLTAREN) 1 % GEL Apply 1 application topically daily. Apply to  feet    . losartan-hydrochlorothiazide (HYZAAR) 100-25 MG tablet Take 1 tablet by mouth daily. 90 tablet 0  . nitroGLYCERIN (NITROSTAT) 0.4 MG SL tablet Place 1 tablet (0.4 mg total) under the tongue every 5 (five) minutes as needed for chest pain. 25 tablet 1  . Oxycodone HCl 10 MG TABS Take 10 mg by mouth 3 (three) times daily as needed.    . polycarbophil (FIBERCON) 625 MG tablet Take 1,250 mg by mouth daily.    . silodosin (RAPAFLO) 8 MG CAPS capsule Take 1 capsule (8 mg total) by mouth daily with breakfast. 90 capsule 1  . zolpidem (AMBIEN) 10 MG tablet Take 5-10 mg by mouth at bedtime.      No current facility-administered medications for this visit.    Allergies:   Patient has no known allergies.    Social History:  The patient  reports that he has never smoked. He has never used smokeless tobacco. He reports previous alcohol use of about 2.0 standard drinks of alcohol per week. He reports that he does not use drugs.   Family History:  The patient's family history includes Coronary artery disease (age of onset: 36) in his father; Coronary artery disease (age of onset: 20) in his brother; Kidney cancer in his brother; Lupus in his daughter; Non-Hodgkin's lymphoma in his mother.    ROS:  Please see the history of present illness.   Otherwise, review of systems are positive for none.   All other systems are reviewed and negative.    PHYSICAL EXAM: VS:  BP (!) 158/82 (BP Location: Left Arm, Patient Position: Sitting, Cuff Size: Normal)   Pulse 69   Ht 5\' 6"  (1.676 m)   Wt 188 lb 2 oz (85.3 kg)   SpO2 99%   BMI 30.36 kg/m  , BMI Body mass index is 30.36 kg/m. GEN: Well nourished, well developed, in no acute distress  HEENT: normal  Neck: no JVD, carotid bruits, or masses Cardiac: RRR; no murmurs, rubs, or gallops,no edema  Respiratory:  clear to auscultation bilaterally, normal work of breathing GI: soft, nontender, nondistended, + BS MS: no deformity or atrophy  Skin: warm  and dry, no rash Neuro:  Strength and sensation are intact Psych: euthymic mood, full affect   EKG:  EKG is ordered today. The ekg ordered today demonstrates normal sinus rhythm with PVCs.  No significant ST or T wave changes.   Recent Labs: 11/23/2018: BUN 17; Creatinine, Ser 0.88; Hemoglobin 14.8; Platelets 117; Potassium 3.6; Sodium 142    Lipid Panel    Component Value Date/Time   CHOL 139 11/18/2013 0246   TRIG 190 (H) 11/18/2013 0246   HDL 38 (L) 11/18/2013 0246   CHOLHDL 3.7 11/18/2013 0246   VLDL 38 11/18/2013 0246   LDLCALC 63 11/18/2013 0246      Wt Readings from Last 3 Encounters:  10/21/19 188 lb 2 oz (85.3 kg)  12/16/18 177 lb (80.3 kg)  12/01/18 178 lb (80.7 kg)     No flowsheet data found.    ASSESSMENT AND PLAN:  1. Coronary artery disease involving native coronary arteries without angina: He is doing extremely well with no anginal symptoms.  Continue medical therapy.  Given stents of both LAD and left circumflex, I  favor dual antiplatelet therapy as tolerated.   2. Hyperlipidemia: Continue treatment with atorvastatin with a target LDL of less than 70.  He was without atorvastatin for 1 month recently and that was the likely reason for elevated cholesterol.  He will get repeat labs in 3 months.  I discussed the importance of taking medications regularly.  3. Essential hypertension: Blood pressure is elevated today but controlled at home and he just took his blood pressure medications.  Continue to monitor for now.  4.  Asymptomatic PVCs: He reports prolonged history of this but overall he has no symptoms.  If blood pressure continues to be elevated, I might consider small dose carvedilol in the near future.    Disposition:   FU with me in 6 months  Signed,  Lorine Bears, MD  10/21/2019 3:10 PM    [facility]

## 2019-11-03 ENCOUNTER — Encounter: Payer: Self-pay | Admitting: Cardiovascular Disease

## 2019-11-07 IMAGING — US US RENAL
1 series · 14 of 25 positions shown · non-contrast
Comparison: None available.

CLINICAL DATA: Initial evaluation for chronic back pain with weak
stream.

EXAM:
RENAL / URINARY TRACT ULTRASOUND COMPLETE

[Series 1: us renal · 0.25mm/px · 14 of 43 slices shown]
[im 1/43]
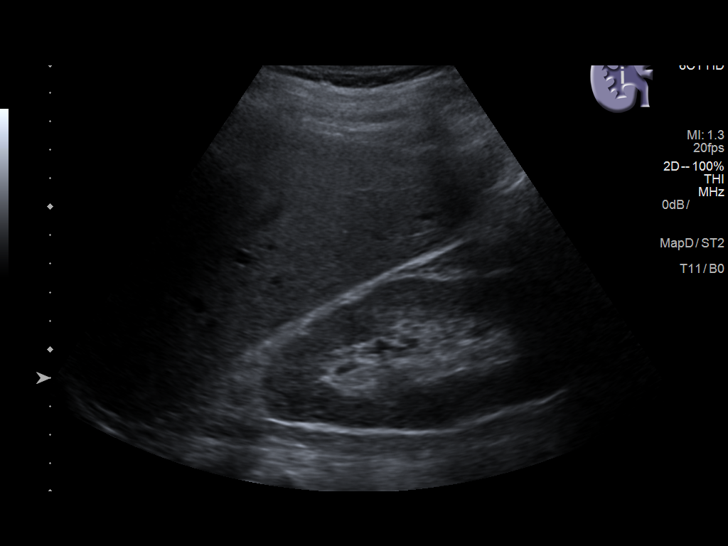
[im 4/43]
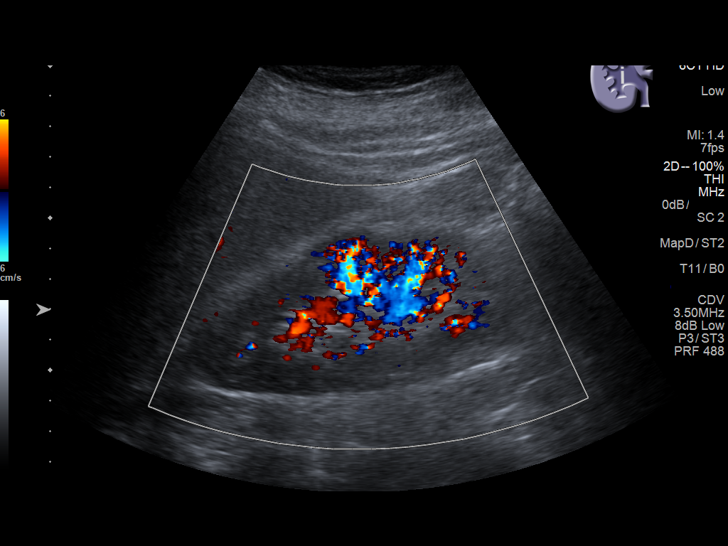
[im 8/43]
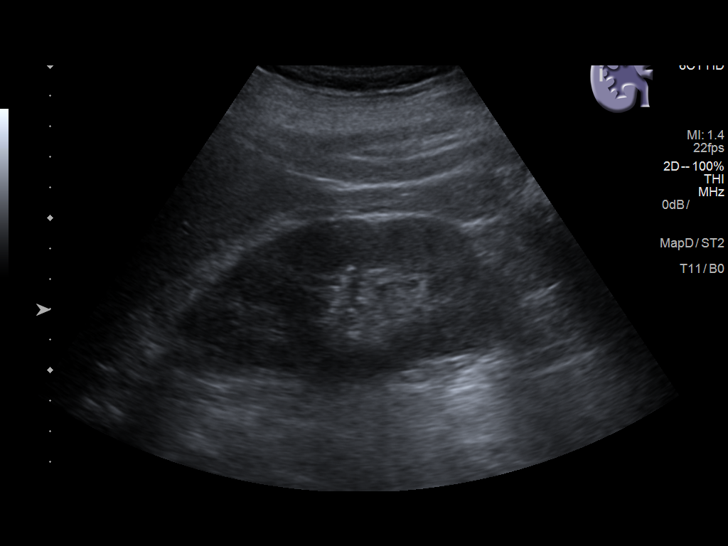
[im 11/43]
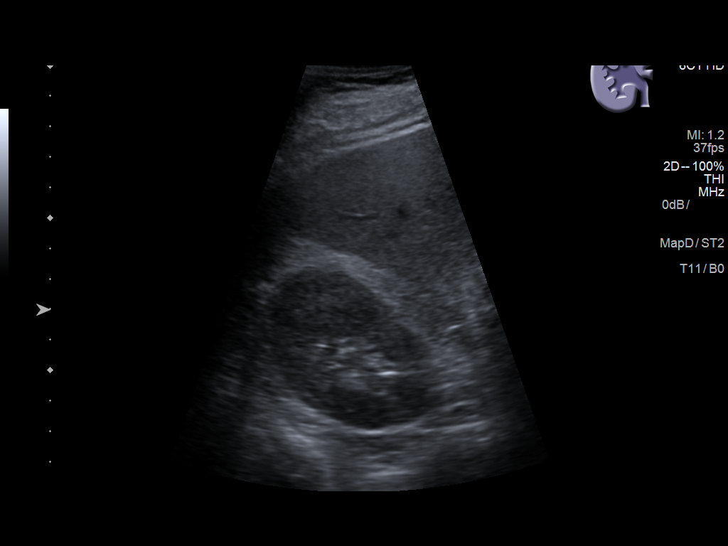
[im 15/43]
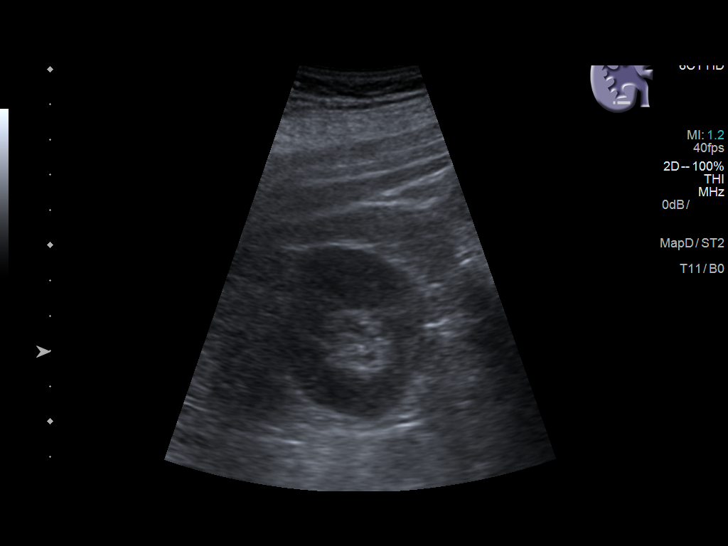
[im 16/43]
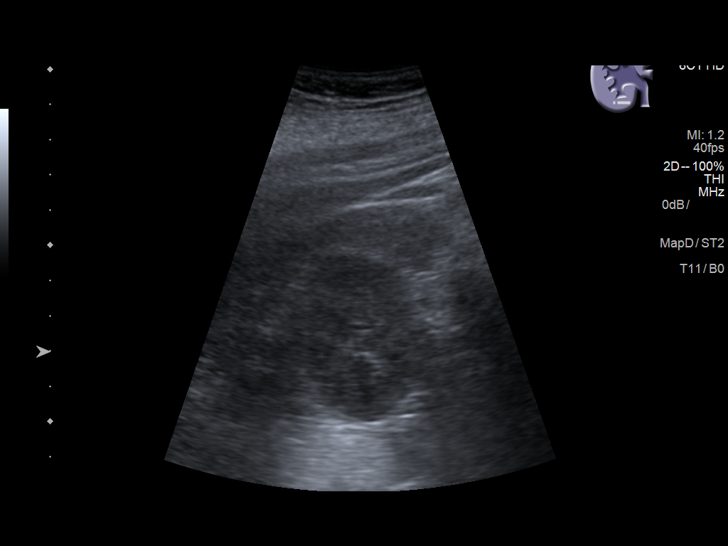
[im 20/43]
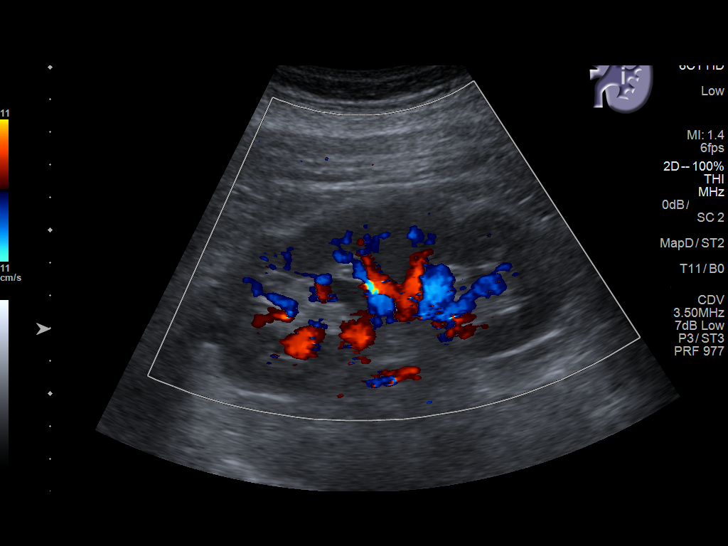
[im 23/43]
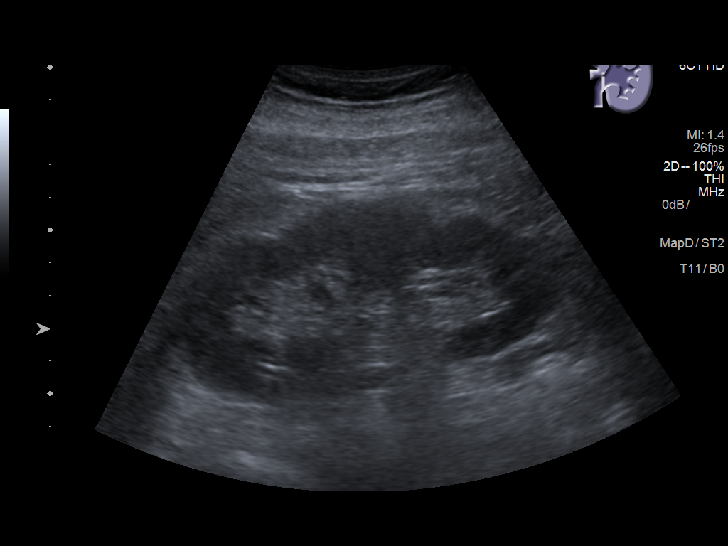
[im 27/43]
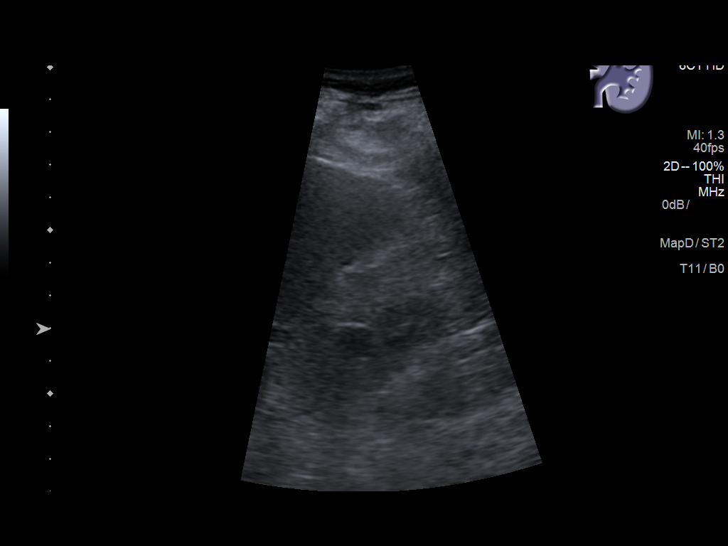
[im 29/43]
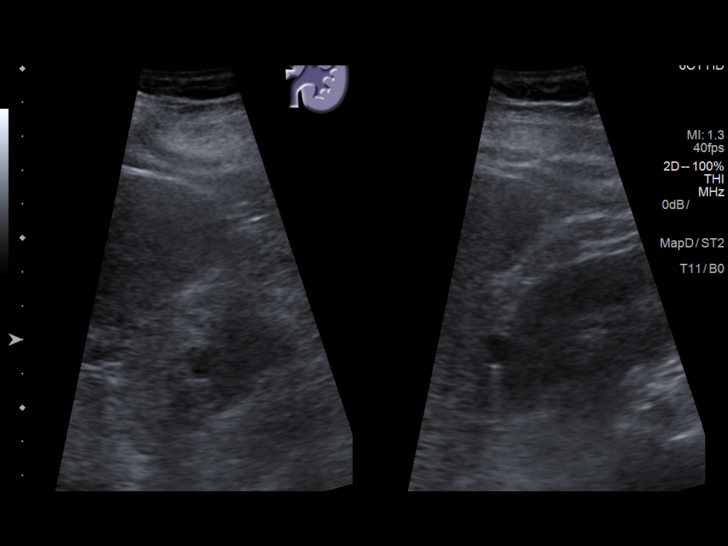
[im 32/43]
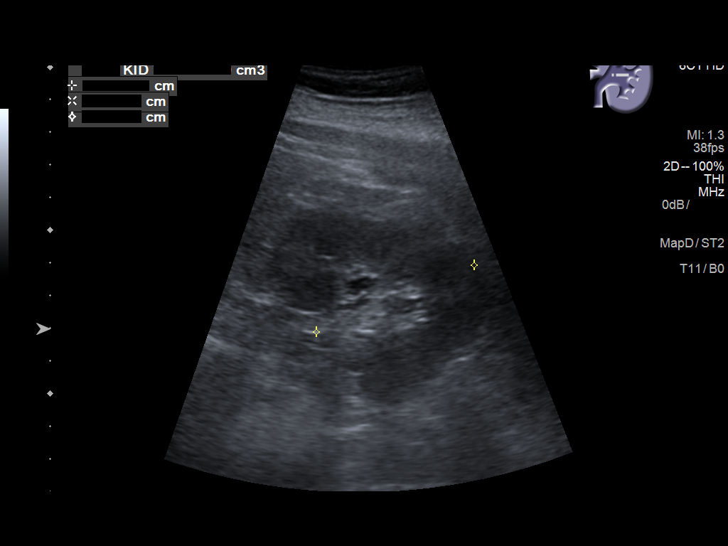
[im 36/43]
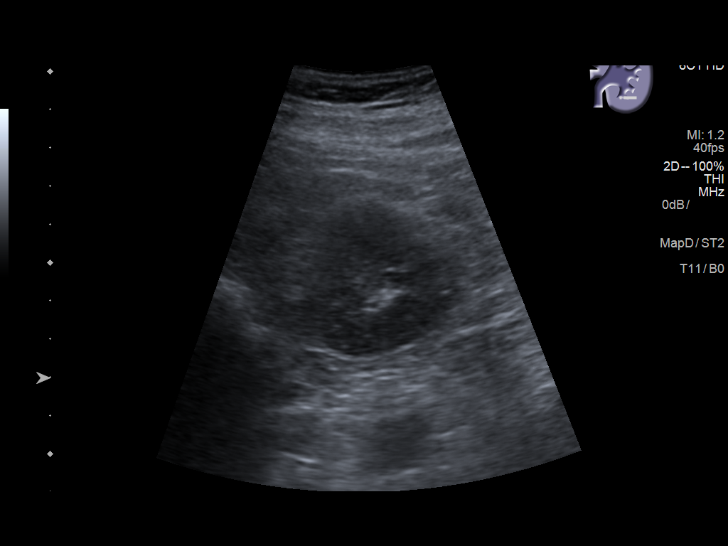
[im 39/43]
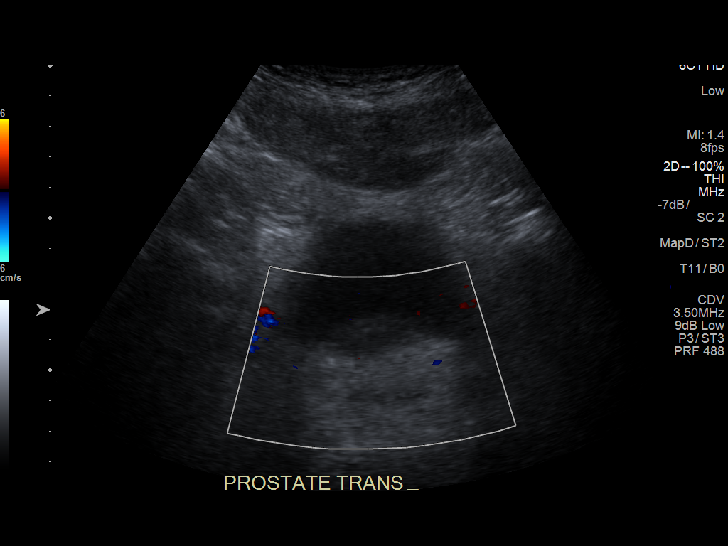
[im 43/43]
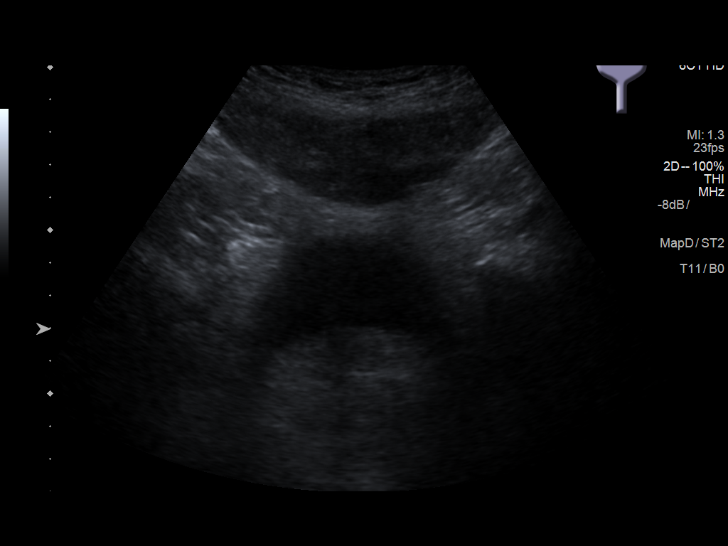

[14 of 25 positions shown; findings below may reference images not displayed]

FINDINGS: Right Kidney:

Renal measurements: 11.7 x 5.3 x 4.5 cm = volume: 146.4 mL .
Echogenicity within normal limits. No mass or hydronephrosis
visualized.

Left Kidney:

Renal measurements: 12.4 x 5.8 x 5.2 cm = volume: 196.0 mL.
Echogenicity within normal limits. Left kidney demonstrates a mildly
lobulated contour. No hydronephrosis. 8 x 9 x 6 mm simple exophytic
cyst extends from the upper pole of the left kidney. No internal
vascularity.

Bladder:

Appears normal for degree of bladder distention.
IMPRESSION: 1. 9 mm simple exophytic cyst at the upper pole of the left kidney.
2. Otherwise unremarkable and normal renal ultrasound. No
hydronephrosis or other acute finding.

## 2019-12-09 DIAGNOSIS — M4802 Spinal stenosis, cervical region: Secondary | ICD-10-CM | POA: Diagnosis not present

## 2019-12-09 DIAGNOSIS — M5136 Other intervertebral disc degeneration, lumbar region: Secondary | ICD-10-CM | POA: Diagnosis not present

## 2019-12-09 DIAGNOSIS — M5030 Other cervical disc degeneration, unspecified cervical region: Secondary | ICD-10-CM | POA: Diagnosis not present

## 2019-12-10 ENCOUNTER — Other Ambulatory Visit: Payer: Self-pay

## 2019-12-10 ENCOUNTER — Encounter: Payer: Self-pay | Admitting: Urology

## 2019-12-10 ENCOUNTER — Ambulatory Visit: Payer: Medicare Other | Admitting: Urology

## 2019-12-10 VITALS — BP 155/82 | HR 64 | Ht 66.0 in | Wt 188.4 lb

## 2019-12-10 DIAGNOSIS — N401 Enlarged prostate with lower urinary tract symptoms: Secondary | ICD-10-CM | POA: Diagnosis not present

## 2019-12-10 DIAGNOSIS — R3912 Poor urinary stream: Secondary | ICD-10-CM | POA: Diagnosis not present

## 2019-12-10 DIAGNOSIS — N138 Other obstructive and reflux uropathy: Secondary | ICD-10-CM | POA: Diagnosis not present

## 2019-12-10 LAB — BLADDER SCAN AMB NON-IMAGING

## 2019-12-10 MED ORDER — SILODOSIN 4 MG PO CAPS: 4.0000 mg | ORAL_CAPSULE | Freq: Every day | ORAL | 3 refills | Status: DC

## 2019-12-10 NOTE — Patient Instructions (Signed)

## 2019-12-10 NOTE — Progress Notes (Signed)
12/10/2019 10:35 AM   Terry Cook [birth year] [id number]  Referring provider: Juluis Rainier, MD [address] [place],  [place] [address]  Chief Complaint  Patient presents with  . Benign Prostatic Hypertrophy    HPI:  F/u -   1) BPH, weak stream and straining to void x 30 years. Prostate not all thatlarge onpriorexam. I saw him in [place] Oct 2018 and PSa was 1.47. His AUASS was 24. Hestartedalfuzosinwhich helpsa lot, but if he misses a dose his stream weakens. He has hesitancy. Weak stream. Needs to double void. Doesn't feel empty. He has no frequency or urgency. No incontinence. AUASS was 19 (off meds). Noc x 3.He has a history of sleep apnea, back painand coronary artery disease. No gross hematuria.He tried alfuzosin again. PSA was 1.7. Cystoscopy benign on 09/11/2018. TURP or Urolift candidate. He started silodosin in 2020 and his symptoms improved.   NG risk - extensive back surgery/pain. No urologic surgery. Tried tamsulosin remotely and recalled trouble with ED. Renal US was normal 08/18/2018.   Past medical history significant for CAD/PCI/Plavix, nitroglycerin use, CPAP at night. Also history of thrombocytopenia and sees Dr. Gweneth Dimitri. BUN was 10.5 and creatinine 0.14 November 2016. He has severe back pain and takes oycodone daily. His brother had kidney cancer.  He returns and is voiding well. No complaints. He continues silodosin 8 mg. He has ED and takes sildenafil as needed.   PMH: Past Medical History:  Diagnosis Date  . Anxiety   . CAD (coronary artery disease), native coronary artery 06/04/2011   s/p MI with PCI to left circ  . Chronic lower back pain   . DDD (degenerative disc disease), lumbar   . Depression   . High cholesterol   . History of kidney stones   . Hypertension   . Irregular heart beats   . MI (myocardial infarction) (HCC) 05/2011  . OSA on CPAP     Surgical History: Past Surgical History:  Procedure Laterality Date  . COLONOSCOPY     . CORONARY ANGIOPLASTY WITH STENT PLACEMENT  05/2011   "1"  . CYST EXCISION Right ~ 2009   "index finger; in office"  . CYSTOSCOPY W/ STONE MANIPULATION  ~ 1975 X 2  . LEFT HEART CATHETERIZATION WITH CORONARY ANGIOGRAM N/A 06/04/2011   Procedure: LEFT HEART CATHETERIZATION WITH CORONARY ANGIOGRAM;  Surgeon: Othella Boyer, MD;  Location: [facility] CATH LAB;  Service: Cardiovascular;  Laterality: N/A;  . LEFT HEART CATHETERIZATION WITH CORONARY ANGIOGRAM N/A 11/18/2013   Procedure: LEFT HEART CATHETERIZATION WITH CORONARY ANGIOGRAM;  Surgeon: Lesleigh Noe, MD;  Location: [facility] CATH LAB;  Service: Cardiovascular;  Laterality: N/A;  . PERCUTANEOUS CORONARY STENT INTERVENTION (PCI-S)  11/18/2013   Procedure: PERCUTANEOUS CORONARY STENT INTERVENTION (PCI-S);  Surgeon: Lesleigh Noe, MD;  Location: [facility] CATH LAB;  Service: Cardiovascular;;  . SHOULDER ARTHROSCOPY WITH OPEN ROTATOR CUFF REPAIR Right 11/24/2018   Procedure: SHOULDER ARTHROSCOPY WITH OPEN MINI ROTATOR CUFF REPAIR, SUBACROMIAL DECOMPRESSION, DISTAL CLAVICLE EXCISION, BICEPS TENODESIS;  Surgeon: Juanell Fairly, MD;  Location: [facility] ORS;  Service: Orthopedics;  Laterality: Right;    Home Medications:  Allergies as of 12/10/2019   No Known Allergies     Medication List       Accurate as of December 10, 2019 10:35 AM. If you have any questions, ask your nurse or doctor.        STOP taking these medications   nitroGLYCERIN 0.4 MG SL tablet Commonly known as: NITROSTAT Stopped  by: Jerilee Field, MD     TAKE these medications   ALPRAZolam 0.5 MG tablet Commonly known as: XANAX Take 0.5 mg by mouth 2 (two) times daily as needed for anxiety.   amLODipine 10 MG tablet Commonly known as: NORVASC Take 1 tablet (10 mg total) by mouth daily.   aspirin EC 81 MG tablet Take 81 mg by mouth daily.   atorvastatin 80 MG tablet Commonly known as: LIPITOR Take 1 tablet (80 mg total) by mouth every evening.   clopidogrel 75 MG  tablet Commonly known as: PLAVIX Take 1 tablet (75 mg total) by mouth daily.   diclofenac sodium 1 % Gel Commonly known as: VOLTAREN Apply 1 application topically daily. Apply to feet   losartan-hydrochlorothiazide 100-25 MG tablet Commonly known as: HYZAAR Take 1 tablet by mouth daily.   Oxycodone HCl 10 MG Tabs Take 10 mg by mouth 3 (three) times daily as needed.   polycarbophil 625 MG tablet Commonly known as: FIBERCON Take 1,250 mg by mouth daily.   silodosin 8 MG Caps capsule Commonly known as: RAPAFLO Take 1 capsule (8 mg total) by mouth daily with breakfast.   zolpidem 10 MG tablet Commonly known as: AMBIEN Take 5-10 mg by mouth at bedtime.       Allergies: No Known Allergies  Family History: Family History  Problem Relation Age of Onset  . Coronary artery disease Father 84  . Coronary artery disease Brother 89  . Non-Hodgkin's lymphoma Mother        Had a "spleen cancer" and died from it when the patient was 69yo  . Kidney cancer Brother   . Lupus Daughter     Social History:  reports that he has never smoked. He has never used smokeless tobacco. He reports previous alcohol use of about 2.0 standard drinks of alcohol per week. He reports that he does not use drugs.   Physical Exam: BP (!) 155/82 (BP Location: Left Arm, Patient Position: Sitting, Cuff Size: Large)   Pulse 64   Ht 5\' 6"  (1.676 m)   Wt 188 lb 6.4 oz (85.5 kg)   BMI 30.41 kg/m   Constitutional:  Alert and oriented, No acute distress. HEENT: [place] AT, moist mucus membranes.  Trachea midline, no masses. Cardiovascular: No clubbing, cyanosis, or edema. Respiratory: Normal respiratory effort, no increased work of breathing. GI: Abdomen is soft, nontender, nondistended, no abdominal masses GU: No CVA tenderness DRE: Prostate 30 g, smooth without hard area or nodule Lymph: No cervical or inguinal lymphadenopathy. Skin: No rashes, bruises or suspicious lesions. Neurologic: Grossly intact, no  focal deficits, moving all 4 extremities. Psychiatric: Normal mood and affect.  Laboratory Data: Lab Results  Component Value Date   WBC 6.7 11/23/2018   HGB 14.8 11/23/2018   HCT 42.3 11/23/2018   MCV 88.9 11/23/2018   PLT 117 (L) 11/23/2018    Lab Results  Component Value Date   CREATININE 0.88 11/23/2018    No results found for: PSA  No results found for: TESTOSTERONE  No results found for: HGBA1C  Urinalysis    Component Value Date/Time   APPEARANCEUR Clear 09/11/2018 1019   GLUCOSEU Negative 09/11/2018 1019   BILIRUBINUR Negative 09/11/2018 1019   PROTEINUR Negative 09/11/2018 1019   NITRITE Negative 09/11/2018 1019   LEUKOCYTESUR Negative 09/11/2018 1019    Lab Results  Component Value Date   LABMICR See below: 09/11/2018   WBCUA 0-5 09/11/2018   LABEPIT None seen 09/11/2018   MUCUS Present (A) 09/11/2018  BACTERIA None seen 09/11/2018    Pertinent Imaging: N/a No results found for this or any previous visit.  No results found for this or any previous visit.  No results found for this or any previous visit.  No results found for this or any previous visit.  Results for orders placed during the hospital encounter of 08/18/18  US RENAL  Narrative CLINICAL DATA:  Initial evaluation for chronic back pain with weak stream.  EXAM: RENAL / URINARY TRACT ULTRASOUND COMPLETE  COMPARISON:  None available.  FINDINGS: Right Kidney:  Renal measurements: 11.7 x 5.3 x 4.5 cm = volume: 146.4 mL . Echogenicity within normal limits. No mass or hydronephrosis visualized.  Left Kidney:  Renal measurements: 12.4 x 5.8 x 5.2 cm = volume: 196.0 mL. Echogenicity within normal limits. Left kidney demonstrates a mildly lobulated contour. No hydronephrosis. 8 x 9 x 6 mm simple exophytic cyst extends from the upper pole of the left kidney. No internal vascularity.  Bladder:  Appears normal for degree of bladder distention.  IMPRESSION: 1. 9 mm simple  exophytic cyst at the upper pole of the left kidney. 2. Otherwise unremarkable and normal renal ultrasound. No hydronephrosis or other acute finding.   Electronically Signed By: Rise Mu [name] On: 08/18/2018 22:08  No results found for this or any previous visit.  No results found for this or any previous visit.  No results found for this or any previous visit.   Assessment & Plan:    1. BPH with obstruction/lower urinary tract symptoms Normal DRE. Discussed PSA screening and he said he will get labs with Dr. Zachery Dauer.  - Bladder Scan (Post Void Residual) in office  2. Weak urinary stream He asked about decreasing silodosin to 4 mg which is a good idea. He wonders if it is making ED worse and we discussed nature r/b/a to alpha blockers. Can also try PRN alpha blocker.  - Bladder Scan (Post Void Residual) in office   No follow-ups on file.  Jerilee Field, MD  [facility] [address], [address] [place], [place] [address] [phone]

## 2019-12-23 DIAGNOSIS — E7800 Pure hypercholesterolemia, unspecified: Secondary | ICD-10-CM | POA: Diagnosis not present

## 2019-12-23 DIAGNOSIS — F339 Major depressive disorder, recurrent, unspecified: Secondary | ICD-10-CM | POA: Diagnosis not present

## 2019-12-23 DIAGNOSIS — I10 Essential (primary) hypertension: Secondary | ICD-10-CM | POA: Diagnosis not present

## 2019-12-23 DIAGNOSIS — I2510 Atherosclerotic heart disease of native coronary artery without angina pectoris: Secondary | ICD-10-CM | POA: Diagnosis not present

## 2020-01-09 ENCOUNTER — Other Ambulatory Visit: Payer: Self-pay

## 2020-01-10 MED ORDER — AMLODIPINE BESYLATE 10 MG PO TABS: 10.0000 mg | ORAL_TABLET | Freq: Every day | ORAL | 1 refills | Status: DC

## 2020-01-10 MED ORDER — ATORVASTATIN CALCIUM 80 MG PO TABS: 80.0000 mg | ORAL_TABLET | Freq: Every evening | ORAL | 1 refills | Status: DC

## 2020-01-10 MED ORDER — LOSARTAN POTASSIUM-HCTZ 100-25 MG PO TABS: 1.0000 | ORAL_TABLET | Freq: Every day | ORAL | 1 refills | Status: DC

## 2020-01-12 ENCOUNTER — Other Ambulatory Visit: Payer: Self-pay | Admitting: *Deleted

## 2020-01-12 MED ORDER — CLOPIDOGREL BISULFATE 75 MG PO TABS
75.0000 mg | ORAL_TABLET | Freq: Every day | ORAL | 1 refills | Status: DC
Start: 2020-01-12 — End: 2020-12-25

## 2020-02-17 DIAGNOSIS — N401 Enlarged prostate with lower urinary tract symptoms: Secondary | ICD-10-CM | POA: Diagnosis not present

## 2020-02-17 DIAGNOSIS — E7800 Pure hypercholesterolemia, unspecified: Secondary | ICD-10-CM | POA: Diagnosis not present

## 2020-02-17 DIAGNOSIS — D696 Thrombocytopenia, unspecified: Secondary | ICD-10-CM | POA: Diagnosis not present

## 2020-02-22 ENCOUNTER — Other Ambulatory Visit: Payer: Self-pay | Admitting: Family Medicine

## 2020-02-22 DIAGNOSIS — I10 Essential (primary) hypertension: Secondary | ICD-10-CM | POA: Diagnosis not present

## 2020-02-22 DIAGNOSIS — E7800 Pure hypercholesterolemia, unspecified: Secondary | ICD-10-CM | POA: Diagnosis not present

## 2020-02-22 DIAGNOSIS — D696 Thrombocytopenia, unspecified: Secondary | ICD-10-CM | POA: Diagnosis not present

## 2020-02-22 DIAGNOSIS — I700 Atherosclerosis of aorta: Secondary | ICD-10-CM | POA: Diagnosis not present

## 2020-03-08 DIAGNOSIS — M5030 Other cervical disc degeneration, unspecified cervical region: Secondary | ICD-10-CM | POA: Diagnosis not present

## 2020-03-08 DIAGNOSIS — M5136 Other intervertebral disc degeneration, lumbar region: Secondary | ICD-10-CM | POA: Diagnosis not present

## 2020-03-08 DIAGNOSIS — F1120 Opioid dependence, uncomplicated: Secondary | ICD-10-CM | POA: Diagnosis not present

## 2020-04-06 DIAGNOSIS — I10 Essential (primary) hypertension: Secondary | ICD-10-CM | POA: Diagnosis not present

## 2020-04-06 DIAGNOSIS — F339 Major depressive disorder, recurrent, unspecified: Secondary | ICD-10-CM | POA: Diagnosis not present

## 2020-04-06 DIAGNOSIS — G8929 Other chronic pain: Secondary | ICD-10-CM | POA: Diagnosis not present

## 2020-04-06 DIAGNOSIS — E7800 Pure hypercholesterolemia, unspecified: Secondary | ICD-10-CM | POA: Diagnosis not present

## 2020-04-11 ENCOUNTER — Other Ambulatory Visit: Payer: Self-pay

## 2020-04-11 ENCOUNTER — Encounter: Payer: Self-pay | Admitting: Cardiovascular Disease

## 2020-04-11 ENCOUNTER — Ambulatory Visit: Payer: Medicare Other | Admitting: Cardiovascular Disease

## 2020-04-11 VITALS — BP 120/70 | HR 64 | Ht 66.0 in | Wt 189.2 lb

## 2020-04-11 DIAGNOSIS — I493 Ventricular premature depolarization: Secondary | ICD-10-CM | POA: Diagnosis not present

## 2020-04-11 DIAGNOSIS — I10 Essential (primary) hypertension: Secondary | ICD-10-CM

## 2020-04-11 DIAGNOSIS — I2510 Atherosclerotic heart disease of native coronary artery without angina pectoris: Secondary | ICD-10-CM | POA: Diagnosis not present

## 2020-04-11 DIAGNOSIS — R079 Chest pain, unspecified: Secondary | ICD-10-CM | POA: Diagnosis not present

## 2020-04-11 DIAGNOSIS — E782 Mixed hyperlipidemia: Secondary | ICD-10-CM | POA: Diagnosis not present

## 2020-04-11 NOTE — Patient Instructions (Signed)
Medication Instructions:  Your physician recommends that you continue on your current medications as directed. Please refer to the Current Medication list given to you today.   *If you need a refill on your cardiac medications before your next appointment, please call your pharmacy*   Lab Work: None ordered If you have labs (blood work) drawn today and your tests are completely normal, you will receive your results only by: Marland Kitchen MyChart Message (if you have MyChart) OR . A paper copy in the mail If you have any lab test that is abnormal or we need to change your treatment, we will call you to review the results.   Testing/Procedures: Your physician has requested that you have an echocardiogram. Echocardiography is a painless test that uses sound waves to create images of your heart. It provides your doctor with information about the size and shape of your heart and how well your heart's chambers and valves are working. This procedure takes approximately one hour. There are no restrictions for this procedure.     Follow-Up: At [facility], you and your health needs are our priority.  As part of our continuing mission to provide you with exceptional heart care, we have created designated Provider Care Teams.  These Care Teams include your primary Cardiologist (physician) and Advanced Practice Providers (APPs -  Physician Assistants and Nurse Practitioners) who all work together to provide you with the care you need, when you need it.  We recommend signing up for the patient portal called "MyChart".  Sign up information is provided on this After Visit Summary.  MyChart is used to connect with patients for Virtual Visits (Telemedicine).  Patients are able to view lab/test results, encounter notes, upcoming appointments, etc.  Non-urgent messages can be sent to your provider as well.   To learn more about what you can do with MyChart, go to [email or web address].    Your next appointment:    12 month(s)  The format for your next appointment:   In Person  Provider:   You may see Dr. Kirke Corin or one of the following Advanced Practice Providers on your designated Care Team:    Nicolasa Ducking, NP  Eula Listen, PA-C  Marisue Ivan, PA-C  Cadence [place], [place]  Gillian Shields, NP    Other Instructions  Echocardiogram An echocardiogram is a procedure that uses painless sound waves (ultrasound) to produce an image of the heart. Images from an echocardiogram can provide important information about:  Signs of coronary artery disease (CAD).  Aneurysm detection. An aneurysm is a weak or damaged part of an artery wall that bulges out from the normal force of blood pumping through the body.  Heart size and shape. Changes in the size or shape of the heart can be associated with certain conditions, including heart failure, aneurysm, and CAD.  Heart muscle function.  Heart valve function.  Signs of a past heart attack.  Fluid buildup around the heart.  Thickening of the heart muscle.  A tumor or infectious growth around the heart valves. Tell a health care provider about:  Any allergies you have.  All medicines you are taking, including vitamins, herbs, eye drops, creams, and over-the-counter medicines.  Any blood disorders you have.  Any surgeries you have had.  Any medical conditions you have.  Whether you are pregnant or may be pregnant. What are the risks? Generally, this is a safe procedure. However, problems may occur, including:  Allergic reaction to dye (contrast) that may be used during the  procedure. What happens before the procedure? No specific preparation is needed. You may eat and drink normally. What happens during the procedure?   An IV tube may be inserted into one of your veins.  You may receive a image enhancer through this tube. An image enhancer is an injection that improves the quality of the pictures from your heart.  A gel will  be applied to your chest.  A wand-like tool (transducer) will be moved over your chest. The gel will help to transmit the sound waves from the transducer.  The sound waves will harmlessly bounce off of your heart to allow the heart images to be captured in real-time motion. The images will be recorded on a computer. The procedure may vary among health care providers and hospitals. What happens after the procedure?  You may return to your normal, everyday life, including diet, activities, and medicines, unless your health care provider tells you not to do that. Summary  An echocardiogram is a procedure that uses painless sound waves (ultrasound) to produce an image of the heart.  Images from an echocardiogram can provide important information about the size and shape of your heart, heart muscle function, heart valve function, and fluid buildup around your heart.  You do not need to do anything to prepare before this procedure. You may eat and drink normally.  After the echocardiogram is completed, you may return to your normal, everyday life, unless your health care provider tells you not to do that. This information is not intended to replace advice given to you by your health care provider. Make sure you discuss any questions you have with your health care provider. Document Revised: 07/16/2018 Document Reviewed: 04/27/2016 Elsevier Patient Education  2020 [employer].

## 2020-04-11 NOTE — Progress Notes (Signed)
Cardiology Office Note   Date:  04/11/2020   ID:  Terry Cook, DOB [birth year], MRN [id number]  PCP:  Juluis Rainier, MD  Cardiologist:   Lorine Bears, MD   Chief Complaint  Patient presents with  . Follow-up    6 month. Patient c/o several spells of mild chest discomfort/indigestion within the past year.         History of Present Illness: Terry Cook is a 70 y.o. male who presents for a follow-up visit regarding coronary artery disease. The patient has known history of coronary artery disease with previous myocardial infarction in 2013.  He had stent placement to the left circumflex and was found at that time to have a chronically occluded RCA with left-to-right collaterals.  He presented in 2015 with unstable angina and was found to have patent left circumflex stent but there was 90% stenosis in the mid LAD which was treated successfully with PCI and drug-eluting stent placement.  No cardiac events since then.  He has strong family history of premature coronary artery disease.  His other chronic medical conditions include essential hypertension, hyperlipidemia, arthritis and chronic low back pain on oxycodone.  He also has known history of severe sleep apnea on CPAP. Ejection fraction in 2013 was 40 to 45%.  No echocardiogram since then.  Overall, he has been doing reasonably well.  Over the last year, he reports 2 brief episodes of chest pain most recently about 4 months ago.  He has no exertional symptoms at the present time.   Past Medical History:  Diagnosis Date  . Anxiety   . CAD (coronary artery disease), native coronary artery 06/04/2011   s/p MI with PCI to left circ  . Chronic lower back pain   . DDD (degenerative disc disease), lumbar   . Depression   . High cholesterol   . History of kidney stones   . Hypertension   . Irregular heart beats   . MI (myocardial infarction) (HCC) 05/2011  . OSA on CPAP     Past Surgical History:  Procedure Laterality  Date  . COLONOSCOPY    . CORONARY ANGIOPLASTY WITH STENT PLACEMENT  05/2011   "1"  . CYST EXCISION Right ~ 2009   "index finger; in office"  . CYSTOSCOPY W/ STONE MANIPULATION  ~ 1975 X 2  . LEFT HEART CATHETERIZATION WITH CORONARY ANGIOGRAM N/A 06/04/2011   Procedure: LEFT HEART CATHETERIZATION WITH CORONARY ANGIOGRAM;  Surgeon: Othella Boyer, MD;  Location: [facility] CATH LAB;  Service: Cardiovascular;  Laterality: N/A;  . LEFT HEART CATHETERIZATION WITH CORONARY ANGIOGRAM N/A 11/18/2013   Procedure: LEFT HEART CATHETERIZATION WITH CORONARY ANGIOGRAM;  Surgeon: Lesleigh Noe, MD;  Location: [facility] CATH LAB;  Service: Cardiovascular;  Laterality: N/A;  . PERCUTANEOUS CORONARY STENT INTERVENTION (PCI-S)  11/18/2013   Procedure: PERCUTANEOUS CORONARY STENT INTERVENTION (PCI-S);  Surgeon: Lesleigh Noe, MD;  Location: [facility] CATH LAB;  Service: Cardiovascular;;  . SHOULDER ARTHROSCOPY WITH OPEN ROTATOR CUFF REPAIR Right 11/24/2018   Procedure: SHOULDER ARTHROSCOPY WITH OPEN MINI ROTATOR CUFF REPAIR, SUBACROMIAL DECOMPRESSION, DISTAL CLAVICLE EXCISION, BICEPS TENODESIS;  Surgeon: Juanell Fairly, MD;  Location: [facility] ORS;  Service: Orthopedics;  Laterality: Right;     Current Outpatient Medications  Medication Sig Dispense Refill  . ALPRAZolam (XANAX) 0.5 MG tablet Take 0.5 mg by mouth 2 (two) times daily as needed for anxiety.    Marland Kitchen amLODipine (NORVASC) 10 MG tablet Take 1 tablet (10 mg total) by mouth daily.  90 tablet 1  . aspirin EC 81 MG tablet Take 81 mg by mouth daily.    Marland Kitchen atorvastatin (LIPITOR) 80 MG tablet Take 1 tablet (80 mg total) by mouth every evening. 90 tablet 1  . clopidogrel (PLAVIX) 75 MG tablet Take 1 tablet (75 mg total) by mouth daily. 90 tablet 1  . diclofenac sodium (VOLTAREN) 1 % GEL Apply 1 application topically daily. Apply to feet    . losartan-hydrochlorothiazide (HYZAAR) 100-25 MG tablet Take 1 tablet by mouth daily. 90 tablet 1  . Oxycodone HCl 10 MG TABS Take 10 mg by  mouth 3 (three) times daily as needed.    . polycarbophil (FIBERCON) 625 MG tablet Take 1,250 mg by mouth daily.    . sertraline (ZOLOFT) 25 MG tablet Take 75 mg by mouth daily.    . silodosin (RAPAFLO) 4 MG CAPS capsule Take 1 capsule (4 mg total) by mouth daily with breakfast. 90 capsule 3  . zolpidem (AMBIEN) 10 MG tablet Take 5-10 mg by mouth at bedtime.      No current facility-administered medications for this visit.    Allergies:   Patient has no known allergies.    Social History:  The patient  reports that he has never smoked. He has never used smokeless tobacco. He reports previous alcohol use of about 2.0 standard drinks of alcohol per week. He reports that he does not use drugs.   Family History:  The patient's family history includes Coronary artery disease (age of onset: 70) in his father; Coronary artery disease (age of onset: 81) in his brother; Kidney cancer in his brother; Lupus in his daughter; Non-Hodgkin's lymphoma in his mother.    ROS:  Please see the history of present illness.   Otherwise, review of systems are positive for none.   All other systems are reviewed and negative.    PHYSICAL EXAM: VS:  BP 120/70 (BP Location: Left Arm, Patient Position: Sitting, Cuff Size: Normal)   Pulse 64   Ht 5\' 6"  (1.676 m)   Wt 189 lb 4 oz (85.8 kg)   SpO2 98%   BMI 30.55 kg/m  , BMI Body mass index is 30.55 kg/m. GEN: Well nourished, well developed, in no acute distress  HEENT: normal  Neck: no JVD, carotid bruits, or masses Cardiac: RRR; no murmurs, rubs, or gallops,no edema  Respiratory:  clear to auscultation bilaterally, normal work of breathing GI: soft, nontender, nondistended, + BS MS: no deformity or atrophy  Skin: warm and dry, no rash Neuro:  Strength and sensation are intact Psych: euthymic mood, full affect   EKG:  EKG is ordered today. The ekg ordered today demonstrates normal sinus rhythm with no significant ST or T wave changes.   Recent  Labs: No results found for requested labs within last 8760 hours.    Lipid Panel    Component Value Date/Time   CHOL 139 11/18/2013 0246   TRIG 190 (H) 11/18/2013 0246   HDL 38 (L) 11/18/2013 0246   CHOLHDL 3.7 11/18/2013 0246   VLDL 38 11/18/2013 0246   LDLCALC 63 11/18/2013 0246      Wt Readings from Last 3 Encounters:  04/11/20 189 lb 4 oz (85.8 kg)  12/10/19 188 lb 6.4 oz (85.5 kg)  10/21/19 188 lb 2 oz (85.3 kg)     No flowsheet data found.    ASSESSMENT AND PLAN:  1. Coronary artery disease involving native coronary arteries without angina: He is doing reasonably well overall.  He did have 2 episodes of chest pain over the last year but currently with no exertional symptoms. Continue medical therapy.  Given stents of both LAD and left circumflex, I  favor dual antiplatelet therapy as tolerated.   2. Hyperlipidemia: Continue treatment with atorvastatin 80 mg daily.  I reviewed most recent lipid profile done in November which showed an LDL of 77 and HDL of 34.  I discussed with him the goal is to get LDL below 70.  We discussed the addition of Zetia but the patient prefers to work on his lifestyle including diet and exercise before adding another medication.  3. Essential hypertension: Blood pressure is well controlled.  4.  Asymptomatic PVCs: Currently with no PVCs by exam or EKG.  5.  History of ischemic cardiomyopathy post MI: EF was mildly reduced with no evaluation since 2013.  I requested an echocardiogram.    Disposition:   FU with me in 12 months  Signed,  Kathlyn [place], MD  04/11/2020 2:45 PM    [place]

## 2020-04-14 ENCOUNTER — Other Ambulatory Visit: Payer: Medicare Other

## 2020-05-05 ENCOUNTER — Other Ambulatory Visit: Payer: Self-pay

## 2020-05-05 ENCOUNTER — Ambulatory Visit (INDEPENDENT_AMBULATORY_CARE_PROVIDER_SITE_OTHER): Payer: Medicare Other

## 2020-05-05 DIAGNOSIS — R079 Chest pain, unspecified: Secondary | ICD-10-CM

## 2020-05-05 LAB — ECHOCARDIOGRAM COMPLETE
AR max vel: 2.77 cm2
AV Area VTI: 2.66 cm2
AV Area mean vel: 2.69 cm2
AV Mean grad: 4 mmHg
AV Peak grad: 7.4 mmHg
Ao pk vel: 1.36 m/s
Area-P 1/2: 5.62 cm2
S' Lateral: 3.5 cm

## 2020-05-05 MED ORDER — PERFLUTREN LIPID MICROSPHERE
1.0000 mL | INTRAVENOUS | Status: AC | PRN
  Administered 2020-05-05: 2 mL via INTRAVENOUS

## 2020-05-12 ENCOUNTER — Telehealth: Payer: Self-pay

## 2020-05-12 NOTE — Telephone Encounter (Signed)
Called to give the patient echo results. [email or web address]. 

## 2020-05-16 NOTE — Telephone Encounter (Signed)
DPR on file. Lmom with results. Results also released to mychart.

## 2020-05-16 NOTE — Telephone Encounter (Signed)
Message below noted. Returned the patients call. Patient is to call back if he has questions.

## 2020-05-16 NOTE — Telephone Encounter (Signed)
Patient returning call  Patient states he did see results on mychart Please call to discuss

## 2020-05-24 ENCOUNTER — Telehealth: Payer: Self-pay | Admitting: Cardiovascular Disease

## 2020-05-24 DIAGNOSIS — M47816 Spondylosis without myelopathy or radiculopathy, lumbar region: Secondary | ICD-10-CM | POA: Diagnosis not present

## 2020-05-24 NOTE — Telephone Encounter (Signed)
   [facility] Pre-operative Risk Assessment    HEARTCARE STAFF: - Please ensure there is not already an duplicate clearance open for this procedure. - Under Visit Info/Reason for Call, type in Other and utilize the format Clearance MM/DD/YY or Clearance TBD. Do not use dashes or single digits. - If request is for dental extraction, please clarify the # of teeth to be extracted.  Request for surgical clearance:  1. What type of surgery is being performed? Spinal injections  2. When is this surgery scheduled? TBD  3. What type of clearance is required (medical clearance vs. Pharmacy clearance to hold med vs. Both)? pharm  4. Are there any medications that need to be held prior to surgery and how long? Plavix 7 days prior  5. Practice name and name of physician performing surgery? [facility] Dr. Davy Pique  6. What is the office phone number? [phone]   7.   What is the office fax number? [phone]  8.   Anesthesia type (None, local, MAC, general) ? Not noted   Marykay Lex 05/24/2020, 2:21 PM  _________________________________________________________________   (provider comments below)

## 2020-05-25 NOTE — Telephone Encounter (Signed)
Yes he can hold Plavix 6 days before the procedure.

## 2020-05-25 NOTE — Telephone Encounter (Signed)
   Primary Cardiologist: Dr. Lorine Bears, MD   Chart reviewed as part of pre-operative protocol coverage. Given past medical history and time since last visit, based on ACC/AHA guidelines, Terry Cook would be at acceptable risk for the planned procedure without further cardiovascular testing.   Pt was recently seen by Dr. Kirke Corin at which time he was having intermittent chest discomfort but felt to be stable. Plavix holding recommendations were reviewed with Dr. Kirke Corin with consensus that he may hold Plavix 7 days prior to back injection and resume as soon as possible thereafter.    The patient was advised that if he develops new symptoms prior to surgery to contact our office to arrange for a follow-up visit, and he verbalized understanding.  I will route this recommendation to the requesting party via Epic fax function and remove from pre-op pool.  Please call with questions.  [name], NP 05/25/2020, 12:18 PM

## 2020-06-08 DIAGNOSIS — M47816 Spondylosis without myelopathy or radiculopathy, lumbar region: Secondary | ICD-10-CM | POA: Diagnosis not present

## 2020-06-30 DIAGNOSIS — Z0000 Encounter for general adult medical examination without abnormal findings: Secondary | ICD-10-CM | POA: Diagnosis not present

## 2020-06-30 DIAGNOSIS — Z1389 Encounter for screening for other disorder: Secondary | ICD-10-CM | POA: Diagnosis not present

## 2020-07-10 DIAGNOSIS — E7800 Pure hypercholesterolemia, unspecified: Secondary | ICD-10-CM | POA: Diagnosis not present

## 2020-07-10 DIAGNOSIS — G4700 Insomnia, unspecified: Secondary | ICD-10-CM | POA: Diagnosis not present

## 2020-07-10 DIAGNOSIS — F339 Major depressive disorder, recurrent, unspecified: Secondary | ICD-10-CM | POA: Diagnosis not present

## 2020-07-10 DIAGNOSIS — I2510 Atherosclerotic heart disease of native coronary artery without angina pectoris: Secondary | ICD-10-CM | POA: Diagnosis not present

## 2020-07-13 DIAGNOSIS — E7800 Pure hypercholesterolemia, unspecified: Secondary | ICD-10-CM | POA: Diagnosis not present

## 2020-07-13 DIAGNOSIS — I2510 Atherosclerotic heart disease of native coronary artery without angina pectoris: Secondary | ICD-10-CM | POA: Diagnosis not present

## 2020-07-13 DIAGNOSIS — F339 Major depressive disorder, recurrent, unspecified: Secondary | ICD-10-CM | POA: Diagnosis not present

## 2020-07-13 DIAGNOSIS — G4700 Insomnia, unspecified: Secondary | ICD-10-CM | POA: Diagnosis not present

## 2020-07-19 DIAGNOSIS — M5136 Other intervertebral disc degeneration, lumbar region: Secondary | ICD-10-CM | POA: Diagnosis not present

## 2020-07-19 DIAGNOSIS — M47816 Spondylosis without myelopathy or radiculopathy, lumbar region: Secondary | ICD-10-CM | POA: Diagnosis not present

## 2020-08-28 DIAGNOSIS — M47816 Spondylosis without myelopathy or radiculopathy, lumbar region: Secondary | ICD-10-CM | POA: Diagnosis not present

## 2020-09-26 DIAGNOSIS — M47816 Spondylosis without myelopathy or radiculopathy, lumbar region: Secondary | ICD-10-CM | POA: Diagnosis not present

## 2020-09-26 DIAGNOSIS — F1120 Opioid dependence, uncomplicated: Secondary | ICD-10-CM | POA: Diagnosis not present

## 2020-09-26 DIAGNOSIS — M5136 Other intervertebral disc degeneration, lumbar region: Secondary | ICD-10-CM | POA: Diagnosis not present

## 2020-11-14 DIAGNOSIS — M47816 Spondylosis without myelopathy or radiculopathy, lumbar region: Secondary | ICD-10-CM | POA: Diagnosis not present

## 2020-12-05 DIAGNOSIS — F1120 Opioid dependence, uncomplicated: Secondary | ICD-10-CM | POA: Diagnosis not present

## 2020-12-05 DIAGNOSIS — M5136 Other intervertebral disc degeneration, lumbar region: Secondary | ICD-10-CM | POA: Diagnosis not present

## 2020-12-18 ENCOUNTER — Other Ambulatory Visit: Payer: Self-pay

## 2020-12-18 MED ORDER — ATORVASTATIN CALCIUM 80 MG PO TABS
80.0000 mg | ORAL_TABLET | Freq: Every evening | ORAL | 1 refills | Status: DC
Start: 2020-12-18 — End: 2021-11-05

## 2020-12-18 MED ORDER — AMLODIPINE BESYLATE 10 MG PO TABS
10.0000 mg | ORAL_TABLET | Freq: Every day | ORAL | 1 refills | Status: DC
Start: 2020-12-18 — End: 2021-08-06

## 2020-12-18 NOTE — Telephone Encounter (Signed)
*  STAT* If patient is at the pharmacy, call can be transferred to refill team.   1. Which medications need to be refilled? (please list name of each medication and dose if known)  Atorvastatin and Amlodipine  2. Which pharmacy/location (including street and city if local pharmacy) is medication to be sent to? [facility]   3. Do they need a 30 day or 90 day supply? 90

## 2020-12-21 ENCOUNTER — Ambulatory Visit: Payer: Medicare Other | Admitting: Urology

## 2020-12-22 ENCOUNTER — Encounter: Payer: Self-pay | Admitting: Urology

## 2020-12-25 ENCOUNTER — Telehealth: Payer: Self-pay | Admitting: Cardiovascular Disease

## 2020-12-25 MED ORDER — CLOPIDOGREL BISULFATE 75 MG PO TABS
75.0000 mg | ORAL_TABLET | Freq: Every day | ORAL | 0 refills | Status: DC
Start: 2020-12-25 — End: 2021-05-07

## 2020-12-25 NOTE — Telephone Encounter (Signed)
*  STAT* If patient is at the pharmacy, call can be transferred to refill team.   1. Which medications need to be refilled? (please list name of each medication and dose if known) Plavix 75mg  1 tablet daily  2. Which pharmacy/location (including street and city if local pharmacy) is medication to be sent to? [facility] [place]  3. Do they need a 30 day or 90 day supply? 90 day

## 2020-12-25 NOTE — Telephone Encounter (Signed)
Requested Prescriptions   Signed Prescriptions Disp Refills   clopidogrel (PLAVIX) 75 MG tablet 90 tablet 0    Sig: Take 1 tablet (75 mg total) by mouth daily.    Authorizing Provider: ARIDA, MUHAMMAD A    Ordering User: NEWCOMER MCCLAIN, Colbert Curenton L    

## 2020-12-26 ENCOUNTER — Other Ambulatory Visit: Payer: Self-pay

## 2020-12-26 DIAGNOSIS — N138 Other obstructive and reflux uropathy: Secondary | ICD-10-CM

## 2020-12-26 DIAGNOSIS — N401 Enlarged prostate with lower urinary tract symptoms: Secondary | ICD-10-CM

## 2020-12-26 DIAGNOSIS — R3912 Poor urinary stream: Secondary | ICD-10-CM

## 2020-12-26 MED ORDER — SILODOSIN 8 MG PO CAPS: 8.0000 mg | ORAL_CAPSULE | Freq: Every day | ORAL | 0 refills | Status: DC

## 2020-12-26 NOTE — Telephone Encounter (Signed)
See my chart message

## 2020-12-27 DIAGNOSIS — F339 Major depressive disorder, recurrent, unspecified: Secondary | ICD-10-CM | POA: Diagnosis not present

## 2020-12-27 DIAGNOSIS — E7800 Pure hypercholesterolemia, unspecified: Secondary | ICD-10-CM | POA: Diagnosis not present

## 2020-12-27 DIAGNOSIS — I10 Essential (primary) hypertension: Secondary | ICD-10-CM | POA: Diagnosis not present

## 2020-12-27 DIAGNOSIS — G4700 Insomnia, unspecified: Secondary | ICD-10-CM | POA: Diagnosis not present

## 2021-01-09 DIAGNOSIS — R972 Elevated prostate specific antigen [PSA]: Secondary | ICD-10-CM | POA: Diagnosis not present

## 2021-01-09 DIAGNOSIS — E7800 Pure hypercholesterolemia, unspecified: Secondary | ICD-10-CM | POA: Diagnosis not present

## 2021-01-09 DIAGNOSIS — D696 Thrombocytopenia, unspecified: Secondary | ICD-10-CM | POA: Diagnosis not present

## 2021-01-11 DIAGNOSIS — R739 Hyperglycemia, unspecified: Secondary | ICD-10-CM | POA: Diagnosis not present

## 2021-01-11 DIAGNOSIS — I10 Essential (primary) hypertension: Secondary | ICD-10-CM | POA: Diagnosis not present

## 2021-01-11 DIAGNOSIS — E7800 Pure hypercholesterolemia, unspecified: Secondary | ICD-10-CM | POA: Diagnosis not present

## 2021-01-11 DIAGNOSIS — Z23 Encounter for immunization: Secondary | ICD-10-CM | POA: Diagnosis not present

## 2021-01-11 DIAGNOSIS — D696 Thrombocytopenia, unspecified: Secondary | ICD-10-CM | POA: Diagnosis not present

## 2021-01-17 ENCOUNTER — Ambulatory Visit: Payer: Medicare Other | Admitting: Urology

## 2021-01-17 ENCOUNTER — Other Ambulatory Visit: Payer: Self-pay

## 2021-01-17 ENCOUNTER — Encounter: Payer: Self-pay | Admitting: Urology

## 2021-01-17 VITALS — BP 143/76 | HR 58 | Ht 66.0 in | Wt 192.0 lb

## 2021-01-17 DIAGNOSIS — R3912 Poor urinary stream: Secondary | ICD-10-CM | POA: Diagnosis not present

## 2021-01-17 DIAGNOSIS — Z125 Encounter for screening for malignant neoplasm of prostate: Secondary | ICD-10-CM

## 2021-01-17 DIAGNOSIS — N138 Other obstructive and reflux uropathy: Secondary | ICD-10-CM | POA: Diagnosis not present

## 2021-01-17 DIAGNOSIS — N401 Enlarged prostate with lower urinary tract symptoms: Secondary | ICD-10-CM | POA: Diagnosis not present

## 2021-01-17 LAB — BLADDER SCAN AMB NON-IMAGING

## 2021-01-17 MED ORDER — SILODOSIN 8 MG PO CAPS: 8.0000 mg | ORAL_CAPSULE | Freq: Every day | ORAL | 3 refills | Status: DC

## 2021-01-17 NOTE — Patient Instructions (Signed)
Benign Prostatic Hyperplasia Benign prostatic hyperplasia (BPH) is an enlarged prostate gland that is caused by the normal aging process and not by cancer. The prostate is a walnut-sized gland that is involved in the production of semen. It is located in front of the rectum and below the bladder. The bladder stores urine and the urethra is the tube that carries the urine out of the body. The prostate may get bigger as a man gets older. An enlarged prostate can press on the urethra. This can make it harder to pass urine. The build-up of urine in the bladder can cause infection. Back pressure and infection may progress to bladder damage and kidney (renal) failure. What are the causes? This condition is part of a normal aging process. However, not all men develop problems from this condition. If the prostate enlarges away from the urethra, urine flow will not be blocked. If it enlarges toward the urethra and compresses it, there will be problems passing urine. What increases the risk? This condition is more likely to develop in men over the age of 50 years. What are the signs or symptoms? Symptoms of this condition include: Getting up often during the night to urinate. Needing to urinate frequently during the day. Difficulty starting urine flow. Decrease in size and strength of your urine stream. Leaking (dribbling) after urinating. Inability to pass urine. This needs immediate treatment. Inability to completely empty your bladder. Pain when you pass urine. This is more common if there is also an infection. Urinary tract infection (UTI). How is this diagnosed? This condition is diagnosed based on your medical history, a physical exam, and your symptoms. Tests will also be done, such as: A post-void bladder scan. This measures any amount of urine that may remain in your bladder after you finish urinating. A digital rectal exam. In a rectal exam, your health care provider checks your prostate by  putting a lubricated, gloved finger into your rectum to feel the back of your prostate gland. This exam detects the size of your gland and any abnormal lumps or growths. An exam of your urine (urinalysis). A prostate specific antigen (PSA) screening. This is a blood test used to screen for prostate cancer. An ultrasound. This test uses sound waves to electronically produce a picture of your prostate gland. Your health care provider may refer you to a specialist in kidney and prostate diseases (urologist). How is this treated? Once symptoms begin, your health care provider will monitor your condition (active surveillance or watchful waiting). Treatment for this condition will depend on the severity of your condition. Treatment may include: Observation and yearly exams. This may be the only treatment needed if your condition and symptoms are mild. Medicines to relieve your symptoms, including: Medicines to shrink the prostate. Medicines to relax the muscle of the prostate. Surgery in severe cases. Surgery may include: Prostatectomy. In this procedure, the prostate tissue is removed completely through an open incision or with a laparoscope or robotics. Transurethral resection of the prostate (TURP). In this procedure, a tool is inserted through the opening at the tip of the penis (urethra). It is used to cut away tissue of the inner core of the prostate. The pieces are removed through the same opening of the penis. This removes the blockage. Transurethral incision (TUIP). In this procedure, small cuts are made in the prostate. This lessens the prostate's pressure on the urethra. Transurethral microwave thermotherapy (TUMT). This procedure uses microwaves to create heat. The heat destroys and removes a   small amount of prostate tissue. Transurethral needle ablation (TUNA). This procedure uses radio frequencies to destroy and remove a small amount of prostate tissue. Interstitial laser coagulation (ILC).  This procedure uses a laser to destroy and remove a small amount of prostate tissue. Transurethral electrovaporization (TUVP). This procedure uses electrodes to destroy and remove a small amount of prostate tissue. Prostatic urethral lift. This procedure inserts an implant to push the lobes of the prostate away from the urethra. Follow these instructions at home: Take over-the-counter and prescription medicines only as told by your health care provider. Monitor your symptoms for any changes. Contact your health care provider with any changes. Avoid drinking large amounts of liquid before going to bed or out in public. Avoid or reduce how much caffeine or alcohol you drink. Give yourself time when you urinate. Keep all follow-up visits as told by your health care provider. This is important. Contact a health care provider if: You have unexplained back pain. Your symptoms do not get better with treatment. You develop side effects from the medicine you are taking. Your urine becomes very dark or has a bad smell. Your lower abdomen becomes distended and you have trouble passing your urine. Get help right away if: You have a fever or chills. You suddenly cannot urinate. You feel lightheaded, or very dizzy, or you faint. There are large amounts of blood or clots in the urine. Your urinary problems become hard to manage. You develop moderate to severe low back or flank pain. The flank is the side of your body between the ribs and the hip. These symptoms may represent a serious problem that is an emergency. Do not wait to see if the symptoms will go away. Get medical help right away. Call your local emergency services (911 in the [place] Do not drive yourself to the hospital. Summary Benign prostatic hyperplasia (BPH) is an enlarged prostate that is caused by the normal aging process and not by cancer. An enlarged prostate can press on the urethra. This can make it hard to pass urine. This  condition is part of a normal aging process and is more likely to develop in men over the age of 50 years. Get help right away if you suddenly cannot urinate. This information is not intended to replace advice given to you by your health care provider. Make sure you discuss any questions you have with your health care provider. Document Revised: 07/05/2020 Document Reviewed: 12/02/2019 Elsevier Patient Education  2022 [employer]  Prostate Cancer Screening Prostate cancer screening is a test that is done to check for the presence of prostate cancer in men. The prostate gland is a walnut-sized gland that is located below the bladder and in front of the rectum in males. The function of the prostate is to add fluid to semen during ejaculation. Prostate cancer is the second most common type of cancer in men. Who should have prostate cancer screening? Screening recommendations vary based on age and other risk factors. Screening is recommended if: You are older than age 18. If you are age 78-69, talk with your health care provider about your need for screening and how often screening should be done. Because most prostate cancers are slow growing and will not cause death, screening is generally reserved in this age group for men who have a 10-15-year life expectancy. You are younger than age 48, and you have these risk factors: Being a [place] male or a male of African descent. Having a father, brother,  or uncle who has been diagnosed with prostate cancer. The risk is higher if your family member's cancer occurred at an early age. Screening is not recommended if: You are younger than age 77. You are between the ages of 37 and 29 and you have no risk factors. You are 63 years of age or older. At this age, the risks that screening can cause are greater than the benefits that it may provide. If you are at high risk for prostate cancer, your health care provider may recommend that you have screenings more  often or that you start screening at a younger age. How is screening for prostate cancer done? The recommended prostate cancer screening test is a blood test called the prostate-specific antigen (PSA) test. PSA is a protein that is made in the prostate. As you age, your prostate naturally produces more PSA. Abnormally high PSA levels may be caused by: Prostate cancer. An enlarged prostate that is not caused by cancer (benign prostatic hyperplasia, BPH). This condition is very common in older men. A prostate gland infection (prostatitis). Depending on the PSA results, you may need more tests, such as: A physical exam to check the size of your prostate gland. Blood and imaging tests. A procedure to remove tissue samples from your prostate gland for testing (biopsy). What are the benefits of prostate cancer screening? Screening can help to identify cancer at an early stage, before symptoms start and when the cancer can be treated more easily. There is a small chance that screening may lower your risk of dying from prostate cancer. The chance is small because prostate cancer is a slow-growing cancer, and most men with prostate cancer die from a different cause. What are the risks of prostate cancer screening? The main risk of prostate cancer screening is diagnosing and treating prostate cancer that would never have caused any symptoms or problems. This is called overdiagnosisand overtreatment. PSA screening cannot tell you if your PSA is high due to cancer or a different cause. A prostate biopsy is the only procedure to diagnose prostate cancer. Even the results of a biopsy may not tell you if your cancer needs to be treated. Slow-growing prostate cancer may not need any treatment other than monitoring, so diagnosing and treating it may cause unnecessary stress or other side effects. Questions to ask your health care provider When should I start prostate cancer screening? What is my risk for prostate  cancer? How often do I need screening? What type of screening tests do I need? How do I get my test results? What do my results mean? Do I need treatment? Where to find more information The American Cancer Society: www.cancer.org American Urological Association: www.auanet.org Contact a health care provider if: You have difficulty urinating. You have pain when you urinate or ejaculate. You have blood in your urine or semen. You have pain in your back or in the area of your prostate. Summary Prostate cancer is a common type of cancer in men. The prostate gland is located below the bladder and in front of the rectum. This gland adds fluid to semen during ejaculation. Prostate cancer screening may identify cancer at an early stage, when the cancer can be treated more easily. The prostate-specific antigen (PSA) test is the recommended screening test for prostate cancer. Discuss the risks and benefits of prostate cancer screening with your health care provider. If you are age 39 or older, the risks that screening can cause are greater than the benefits that it may  provide. This information is not intended to replace advice given to you by your health care provider. Make sure you discuss any questions you have with your health care provider. Document Revised: 05/26/2020 Document Reviewed: 11/05/2018 Elsevier Patient Education  2022 [employer].

## 2021-01-17 NOTE — Progress Notes (Signed)
   01/17/2021 8:31 AM   Terry Cook October 09, 1950 [id number]  Reason for visit: Follow up BPH, PSA screening  HPI: 70-year-old male previously followed by Dr. Mena Goes for long-term BPH.  He currently is on silodosin 8 mg daily with excellent results.  He really has no complaints about the urination today.  IPSS score today is 5, with quality of life delighted, and PVR is normal.  He has sleep apnea and uses CPAP, and is not getting up at all overnight.  He previously underwent a normal cystoscopy in 2020, as well as a normal renal/bladder ultrasound.  No prostate volume available.  PSA normal at 2.7.  We reviewed the AUA guidelines that do not recommend routine screening in men over age 26.  He would like to continue the silodosin for BPH.  We discussed outlet procedures like UroLift or HOLEP, and he is currently satisfied with his urinary symptoms on the silodosin.  Silodosin refilled RTC 1 year PVR IPSS    Sondra Come, MD  [facility] [address] [place], [place] [address] [phone]

## 2021-01-28 NOTE — Progress Notes (Signed)
Cardiology Office Note    Date:  01/29/2021   ID:  Terry Cook, DOB April 01, 1951, MRN [id number]  PCP:  Ileana Ladd, MD  Cardiologist:  Lorine Bears, MD  Electrophysiologist:  None   Chief Complaint: Chest pain and fatigue  History of Present Illness:   Terry Cook is a 70 y.o. male with history of CAD with prior MI in 2013 status post PCI to the LCx with unstable angina in 2015 s/p PCI to the LAD, ICM with subsequent improvement in LV systolic function, strong family history of premature coronary artery disease, COVID x2, most recently approximately in 10/2020, PVCs, HTN, HLD, anxiety, BPH, OSA on CPAP, arthritis, and chronic low back pain on oxycodone who presents for evaluation of chest pain and fatigue.  He was admitted with an MI in 2013.  He underwent PCI to the LCx and was found to have a chronically occluded RCA with left-to-right collaterals at that time.  EF at that time was 40 to 45%.  He presented with unstable angina in 2015 and was found to have a patent LCx stent, though there was 90% stenosis in the mid LAD which was treated successfully with PCI/DES.  He has not had any cardiac events since then.  He was last seen in the office in 04/2020 and was doing reasonably well.  Over the preceding year, he did report 2 brief episodes of chest discomfort, the most recent was in the summer 2021.  There were no exertional symptoms with recommendation to continue medical therapy.  Given he had not had an echo since his MI in 2013, he underwent echo in 04/2020 which showed an improvement in his LV systolic function with a low normal EF of 50 to 55%, possible basal inferior wall hypokinesis, mild LVH, grade 1 diastolic dysfunction, normal RV systolic function and ventricular cavity size, moderately dilated left atrium, and no noted significant valvular abnormalities.  He comes in today noting intermittent profound fatigue/exhaustion that is so severe at times he feels presyncopal with  these episodes.  They occur randomly and are not exclusively associated with exertion.  Episodes improve with sitting down.  Approximately 2 to 3 weeks ago, while at rest, he had an episode of severe substernal chest pressure that felt like "someone was drilling into my heart."  He took 2 sublingual nitroglycerin, aspirin, and antacid.  While on the way to call 911 the symptoms spontaneously resolved.  There were no associated symptoms.  He has been without pain since.  Symptoms felt similar in severity to his prior angina, though they were different in presentation given such a localized area affected on this most recent episode.  He felt like something just was not right leading him to schedule an appointment for today.  He is tolerating all medications without issues.  He is currently chest pain-free.   Labs independently reviewed: 02/2020 - AST/ALT normal, TC 130, TG 106, HDL 34, LDL 77, Hgb 14.7, PLT 129, BUN 15, serum creatinine 0.94, potassium 3.7  Past Medical History:  Diagnosis Date   Anxiety    CAD (coronary artery disease), native coronary artery 06/04/2011   s/p MI with PCI to left circ   Chronic lower back pain    DDD (degenerative disc disease), lumbar    Depression    High cholesterol    History of kidney stones    Hypertension    Irregular heart beats    MI (myocardial infarction) (HCC) 05/2011   OSA on CPAP  Past Surgical History:  Procedure Laterality Date   COLONOSCOPY     CORONARY ANGIOPLASTY WITH STENT PLACEMENT  05/2011   "1"   CYST EXCISION Right ~ 2009   "index finger; in office"   CYSTOSCOPY W/ STONE MANIPULATION  ~ 1975 X 2   LEFT HEART CATHETERIZATION WITH CORONARY ANGIOGRAM N/A 06/04/2011   Procedure: LEFT HEART CATHETERIZATION WITH CORONARY ANGIOGRAM;  Surgeon: Othella Boyer, MD;  Location: [facility] CATH LAB;  Service: Cardiovascular;  Laterality: N/A;   LEFT HEART CATHETERIZATION WITH CORONARY ANGIOGRAM N/A 11/18/2013   Procedure: LEFT HEART CATHETERIZATION  WITH CORONARY ANGIOGRAM;  Surgeon: Lesleigh Noe, MD;  Location: [facility] CATH LAB;  Service: Cardiovascular;  Laterality: N/A;   PERCUTANEOUS CORONARY STENT INTERVENTION (PCI-S)  11/18/2013   Procedure: PERCUTANEOUS CORONARY STENT INTERVENTION (PCI-S);  Surgeon: Lesleigh Noe, MD;  Location: [facility] CATH LAB;  Service: Cardiovascular;;   SHOULDER ARTHROSCOPY WITH OPEN ROTATOR CUFF REPAIR Right 11/24/2018   Procedure: SHOULDER ARTHROSCOPY WITH OPEN MINI ROTATOR CUFF REPAIR, SUBACROMIAL DECOMPRESSION, DISTAL CLAVICLE EXCISION, BICEPS TENODESIS;  Surgeon: Juanell Fairly, MD;  Location: [facility] ORS;  Service: Orthopedics;  Laterality: Right;    Current Medications: Current Meds  Medication Sig   ALPRAZolam (XANAX) 0.5 MG tablet Take 0.5 mg by mouth 2 (two) times daily as needed for anxiety.   amLODipine (NORVASC) 10 MG tablet Take 1 tablet (10 mg total) by mouth daily.   aspirin EC 81 MG tablet Take 81 mg by mouth daily.   atorvastatin (LIPITOR) 80 MG tablet Take 1 tablet (80 mg total) by mouth every evening.   clopidogrel (PLAVIX) 75 MG tablet Take 1 tablet (75 mg total) by mouth daily.   diclofenac sodium (VOLTAREN) 1 % GEL Apply 1 application topically daily. Apply to feet   polycarbophil (FIBERCON) 625 MG tablet Take 1,250 mg by mouth daily.   sertraline (ZOLOFT) 25 MG tablet Take 75 mg by mouth daily.   silodosin (RAPAFLO) 8 MG CAPS capsule Take 1 capsule (8 mg total) by mouth daily with breakfast.   zolpidem (AMBIEN) 10 MG tablet Take 5-10 mg by mouth at bedtime.     Allergies:   Patient has no known allergies.   Social History   Socioeconomic History   Marital status: Married    Spouse name: Not on file   Number of children: Not on file   Years of education: Not on file   Highest education level: Not on file  Occupational History   Not on file  Tobacco Use   Smoking status: Never   Smokeless tobacco: Never  Vaping Use   Vaping Use: Never used  Substance and Sexual Activity    Alcohol use: Not Currently    Alcohol/week: 2.0 standard drinks    Types: 2 Glasses of wine per week   Drug use: No   Sexual activity: Yes  Other Topics Concern   Not on file  Social History Narrative   Not on file   Social Determinants of Health   Financial Resource Strain: Not on file  Food Insecurity: Not on file  Transportation Needs: Not on file  Physical Activity: Not on file  Stress: Not on file  Social Connections: Not on file     Family History:  The patient's family history includes Coronary artery disease (age of onset: 24) in his father; Coronary artery disease (age of onset: 82) in his brother; Kidney cancer in his brother; Lupus in his daughter; Non-Hodgkin's lymphoma in his mother.  ROS:  Review of Systems  Constitutional:  Positive for malaise/fatigue. Negative for chills, diaphoresis, fever and weight loss.  HENT:  Negative for congestion.   Eyes:  Negative for discharge and redness.  Respiratory:  Negative for cough, sputum production, shortness of breath and wheezing.   Cardiovascular:  Positive for chest pain. Negative for palpitations, orthopnea, claudication, leg swelling and PND.  Gastrointestinal:  Negative for abdominal pain, heartburn, nausea and vomiting.  Musculoskeletal:  Negative for falls and myalgias.  Skin:  Negative for rash.  Neurological:  Positive for weakness. Negative for dizziness, tingling, tremors, sensory change, speech change, focal weakness and loss of consciousness.  Endo/Heme/Allergies:  Does not bruise/bleed easily.  Psychiatric/Behavioral:  Negative for substance abuse. The patient is not nervous/anxious.   All other systems reviewed and are negative.   EKGs/Labs/Other Studies Reviewed:    Studies reviewed were summarized above. The additional studies were reviewed today:  2D echo 05/05/2020: 1. Left ventricular ejection fraction, by estimation, is 50 to 55%. The  left ventricle has low normal function. Parasternal short  axis images  suggestive of basal inferior wall hypokineis. There is mild left  ventricular hypertrophy. Left ventricular  diastolic parameters are consistent with Grade I diastolic dysfunction  (impaired relaxation).   2. Right ventricular systolic function is normal. The right ventricular  size is normal.   3. Left atrial size was moderately dilated.   4. The aortic valve is normal in structure.Trileaflet. Aortic valve  regurgitation is not visualized. No aortic stenosis is present.   5. Challenging images.    EKG:  EKG is ordered today.  The EKG ordered today demonstrates NSR, 61 bpm, no acute ST-T changes  Recent Labs: No results found for requested labs within last 8760 hours.  Recent Lipid Panel    Component Value Date/Time   CHOL 139 11/18/2013 0246   TRIG 190 (H) 11/18/2013 0246   HDL 38 (L) 11/18/2013 0246   CHOLHDL 3.7 11/18/2013 0246   VLDL 38 11/18/2013 0246   LDLCALC 63 11/18/2013 0246    PHYSICAL EXAM:    VS:  BP (!) 158/88 (BP Location: Left Arm, Patient Position: Sitting, Cuff Size: Normal)   Pulse 61   Ht 5\' 6"  (1.676 m)   Wt 193 lb (87.5 kg)   SpO2 98%   BMI 31.15 kg/m   BMI: Body mass index is 31.15 kg/m.  Physical Exam Vitals reviewed.  Constitutional:      Appearance: He is well-developed.  HENT:     Head: Normocephalic and atraumatic.  Eyes:     General:        Right eye: No discharge.        Left eye: No discharge.  Neck:     Vascular: No JVD.  Cardiovascular:     Rate and Rhythm: Normal rate and regular rhythm.     Pulses:          Posterior tibial pulses are 2+ on the right side and 2+ on the left side.     Heart sounds: Normal heart sounds, S1 normal and S2 normal. Heart sounds not distant. No midsystolic click and no opening snap. No murmur heard.   No friction rub.  Pulmonary:     Effort: Pulmonary effort is normal. No respiratory distress.     Breath sounds: Normal breath sounds. No decreased breath sounds, wheezing or rales.   Chest:     Chest wall: No tenderness.  Abdominal:     General: There is no distension.  Palpations: Abdomen is soft.     Tenderness: There is no abdominal tenderness.  Musculoskeletal:     Cervical back: Normal range of motion.     Right lower leg: No edema.     Left lower leg: No edema.  Skin:    General: Skin is warm and dry.     Nails: There is no clubbing.  Neurological:     Mental Status: He is alert and oriented to person, place, and time.  Psychiatric:        Speech: Speech normal.        Behavior: Behavior normal.        Thought Content: Thought content normal.        Judgment: Judgment normal.    Wt Readings from Last 3 Encounters:  01/29/21 193 lb (87.5 kg)  01/17/21 192 lb (87.1 kg)  04/11/20 189 lb 4 oz (85.8 kg)     ASSESSMENT & PLAN:   CAD involving the native coronary arteries with accelerating angina: Currently chest pain-free.  Discussed noninvasive and invasive ischemic testing, given history and symptoms, it has been felt the best likely option would be to proceed directly with diagnostic LHC.  Continue aspirin and clopidogrel without interruption given his stents placed in the LAD and LCx previously.  He will also continue amlodipine and atorvastatin.  Risks and benefits of cardiac catheterization have been discussed with the patient including risks of bleeding, bruising, infection, kidney damage, stroke, heart attack, and death. The patient understands these risks and is willing to proceed with the procedure. All questions have been answered and concerns listened to.   History of HFrEF secondary to ICM: EF low normal earlier this year.  He appears euvolemic and well compensated.  Not currently on a beta-blocker secondary to heart rates in the upper 50s to low 60s bpm at baseline.  Fatigue: Possibly related to previous COVID infection.  Recent echo demonstrated a low normal LV systolic function.  Check CBC, TSH, and BMP.  HTN: Blood pressure is mildly  elevated in the office today.  Typically, this is reasonably controlled.  No changes were made at this time.  If blood pressure remains elevated post cath, consider addition of ARB.  HLD: LDL 74 in 02/2020 with goal being less than 70.  We will attempt to get labs from his PCPs office.  If an updated lipid panel is not available for review with these labs, recommend updating lipid panel in follow-up with recommendation to escalate cholesterol therapy as indicated to achieve target LDL.  PVCs: Quiescent.  Not on a standing beta-blocker with baseline rates in the upper 50s to low 60s bpm.  Disposition: F/u with Dr. Kirke Corin or an APP in 2 to 3 weeks, post cath.   Medication Adjustments/Labs and Tests Ordered: Current medicines are reviewed at length with the patient today.  Concerns regarding medicines are outlined above. Medication changes, Labs and Tests ordered today are summarized above and listed in the Patient Instructions accessible in Encounters.   Signed, Eula Listen, PA-C 01/29/2021 10:57 AM     [facility] [place] [address] [place], [place] [address] [phone]

## 2021-01-28 NOTE — H&P (View-Only) (Signed)
Cardiology Office Note    Date:  01/29/2021   ID:  Terry Cook, DOB April 01, 1951, MRN [id number]  PCP:  Ileana Ladd, MD  Cardiologist:  Lorine Bears, MD  Electrophysiologist:  None   Chief Complaint: Chest pain and fatigue  History of Present Illness:   Terry Cook is a 70 y.o. male with history of CAD with prior MI in 2013 status post PCI to the LCx with unstable angina in 2015 s/p PCI to the LAD, ICM with subsequent improvement in LV systolic function, strong family history of premature coronary artery disease, COVID x2, most recently approximately in 10/2020, PVCs, HTN, HLD, anxiety, BPH, OSA on CPAP, arthritis, and chronic low back pain on oxycodone who presents for evaluation of chest pain and fatigue.  He was admitted with an MI in 2013.  He underwent PCI to the LCx and was found to have a chronically occluded RCA with left-to-right collaterals at that time.  EF at that time was 40 to 45%.  He presented with unstable angina in 2015 and was found to have a patent LCx stent, though there was 90% stenosis in the mid LAD which was treated successfully with PCI/DES.  He has not had any cardiac events since then.  He was last seen in the office in 04/2020 and was doing reasonably well.  Over the preceding year, he did report 2 brief episodes of chest discomfort, the most recent was in the summer 2021.  There were no exertional symptoms with recommendation to continue medical therapy.  Given he had not had an echo since his MI in 2013, he underwent echo in 04/2020 which showed an improvement in his LV systolic function with a low normal EF of 50 to 55%, possible basal inferior wall hypokinesis, mild LVH, grade 1 diastolic dysfunction, normal RV systolic function and ventricular cavity size, moderately dilated left atrium, and no noted significant valvular abnormalities.  He comes in today noting intermittent profound fatigue/exhaustion that is so severe at times he feels presyncopal with  these episodes.  They occur randomly and are not exclusively associated with exertion.  Episodes improve with sitting down.  Approximately 2 to 3 weeks ago, while at rest, he had an episode of severe substernal chest pressure that felt like "someone was drilling into my heart."  He took 2 sublingual nitroglycerin, aspirin, and antacid.  While on the way to call 911 the symptoms spontaneously resolved.  There were no associated symptoms.  He has been without pain since.  Symptoms felt similar in severity to his prior angina, though they were different in presentation given such a localized area affected on this most recent episode.  He felt like something just was not right leading him to schedule an appointment for today.  He is tolerating all medications without issues.  He is currently chest pain-free.   Labs independently reviewed: 02/2020 - AST/ALT normal, TC 130, TG 106, HDL 34, LDL 77, Hgb 14.7, PLT 129, BUN 15, serum creatinine 0.94, potassium 3.7  Past Medical History:  Diagnosis Date   Anxiety    CAD (coronary artery disease), native coronary artery 06/04/2011   s/p MI with PCI to left circ   Chronic lower back pain    DDD (degenerative disc disease), lumbar    Depression    High cholesterol    History of kidney stones    Hypertension    Irregular heart beats    MI (myocardial infarction) (HCC) 05/2011   OSA on CPAP  Past Surgical History:  Procedure Laterality Date   COLONOSCOPY     CORONARY ANGIOPLASTY WITH STENT PLACEMENT  05/2011   "1"   CYST EXCISION Right ~ 2009   "index finger; in office"   CYSTOSCOPY W/ STONE MANIPULATION  ~ 1975 X 2   LEFT HEART CATHETERIZATION WITH CORONARY ANGIOGRAM N/A 06/04/2011   Procedure: LEFT HEART CATHETERIZATION WITH CORONARY ANGIOGRAM;  Surgeon: William S Tilley, MD;  Location: [facility];  Service: Cardiovascular;  Laterality: N/A;   LEFT HEART CATHETERIZATION WITH CORONARY ANGIOGRAM N/A 11/18/2013   Procedure: LEFT HEART CATHETERIZATION  WITH CORONARY ANGIOGRAM;  Surgeon: Henry W Smith III, MD;  Location: [facility];  Service: Cardiovascular;  Laterality: N/A;   PERCUTANEOUS CORONARY STENT INTERVENTION (PCI-S)  11/18/2013   Procedure: PERCUTANEOUS CORONARY STENT INTERVENTION (PCI-S);  Surgeon: Henry W Smith III, MD;  Location: [facility];  Service: Cardiovascular;;   SHOULDER ARTHROSCOPY WITH OPEN ROTATOR CUFF REPAIR Right 11/24/2018   Procedure: SHOULDER ARTHROSCOPY WITH OPEN MINI ROTATOR CUFF REPAIR, SUBACROMIAL DECOMPRESSION, DISTAL CLAVICLE EXCISION, BICEPS TENODESIS;  Surgeon: Krasinski, Kevin, MD;  Location: [facility] ORS;  Service: Orthopedics;  Laterality: Right;    Current Medications: Current Meds  Medication Sig   ALPRAZolam (XANAX) 0.5 MG tablet Take 0.5 mg by mouth 2 (two) times daily as needed for anxiety.   amLODipine (NORVASC) 10 MG tablet Take 1 tablet (10 mg total) by mouth daily.   aspirin EC 81 MG tablet Take 81 mg by mouth daily.   atorvastatin (LIPITOR) 80 MG tablet Take 1 tablet (80 mg total) by mouth every evening.   clopidogrel (PLAVIX) 75 MG tablet Take 1 tablet (75 mg total) by mouth daily.   diclofenac sodium (VOLTAREN) 1 % GEL Apply 1 application topically daily. Apply to feet   polycarbophil (FIBERCON) 625 MG tablet Take 1,250 mg by mouth daily.   sertraline (ZOLOFT) 25 MG tablet Take 75 mg by mouth daily.   silodosin (RAPAFLO) 8 MG CAPS capsule Take 1 capsule (8 mg total) by mouth daily with breakfast.   zolpidem (AMBIEN) 10 MG tablet Take 5-10 mg by mouth at bedtime.     Allergies:   Patient has no known allergies.   Social History   Socioeconomic History   Marital status: Married    Spouse name: Not on file   Number of children: Not on file   Years of education: Not on file   Highest education level: Not on file  Occupational History   Not on file  Tobacco Use   Smoking status: Never   Smokeless tobacco: Never  Vaping Use   Vaping Use: Never used  Substance and Sexual Activity    Alcohol use: Not Currently    Alcohol/week: 2.0 standard drinks    Types: 2 Glasses of wine per week   Drug use: No   Sexual activity: Yes  Other Topics Concern   Not on file  Social History Narrative   Not on file   Social Determinants of Health   Financial Resource Strain: Not on file  Food Insecurity: Not on file  Transportation Needs: Not on file  Physical Activity: Not on file  Stress: Not on file  Social Connections: Not on file     Family History:  The patient's family history includes Coronary artery disease (age of onset: 46) in his father; Coronary artery disease (age of onset: 55) in his brother; Kidney cancer in his brother; Lupus in his daughter; Non-Hodgkin's lymphoma in his mother.  ROS:     Review of Systems  Constitutional:  Positive for malaise/fatigue. Negative for chills, diaphoresis, fever and weight loss.  HENT:  Negative for congestion.   Eyes:  Negative for discharge and redness.  Respiratory:  Negative for cough, sputum production, shortness of breath and wheezing.   Cardiovascular:  Positive for chest pain. Negative for palpitations, orthopnea, claudication, leg swelling and PND.  Gastrointestinal:  Negative for abdominal pain, heartburn, nausea and vomiting.  Musculoskeletal:  Negative for falls and myalgias.  Skin:  Negative for rash.  Neurological:  Positive for weakness. Negative for dizziness, tingling, tremors, sensory change, speech change, focal weakness and loss of consciousness.  Endo/Heme/Allergies:  Does not bruise/bleed easily.  Psychiatric/Behavioral:  Negative for substance abuse. The patient is not nervous/anxious.   All other systems reviewed and are negative.   EKGs/Labs/Other Studies Reviewed:    Studies reviewed were summarized above. The additional studies were reviewed today:  2D echo 05/05/2020: 1. Left ventricular ejection fraction, by estimation, is 50 to 55%. The  left ventricle has low normal function. Parasternal short  axis images  suggestive of basal inferior wall hypokineis. There is mild left  ventricular hypertrophy. Left ventricular  diastolic parameters are consistent with Grade I diastolic dysfunction  (impaired relaxation).   2. Right ventricular systolic function is normal. The right ventricular  size is normal.   3. Left atrial size was moderately dilated.   4. The aortic valve is normal in structure.Trileaflet. Aortic valve  regurgitation is not visualized. No aortic stenosis is present.   5. Challenging images.    EKG:  EKG is ordered today.  The EKG ordered today demonstrates NSR, 61 bpm, no acute ST-T changes  Recent Labs: No results found for requested labs within last 8760 hours.  Recent Lipid Panel    Component Value Date/Time   CHOL 139 11/18/2013 0246   TRIG 190 (H) 11/18/2013 0246   HDL 38 (L) 11/18/2013 0246   CHOLHDL 3.7 11/18/2013 0246   VLDL 38 11/18/2013 0246   LDLCALC 63 11/18/2013 0246    PHYSICAL EXAM:    VS:  BP (!) 158/88 (BP Location: Left Arm, Patient Position: Sitting, Cuff Size: Normal)   Pulse 61   Ht 5\' 6"  (1.676 m)   Wt 193 lb (87.5 kg)   SpO2 98%   BMI 31.15 kg/m   BMI: Body mass index is 31.15 kg/m.  Physical Exam Vitals reviewed.  Constitutional:      Appearance: He is well-developed.  HENT:     Head: Normocephalic and atraumatic.  Eyes:     General:        Right eye: No discharge.        Left eye: No discharge.  Neck:     Vascular: No JVD.  Cardiovascular:     Rate and Rhythm: Normal rate and regular rhythm.     Pulses:          Posterior tibial pulses are 2+ on the right side and 2+ on the left side.     Heart sounds: Normal heart sounds, S1 normal and S2 normal. Heart sounds not distant. No midsystolic click and no opening snap. No murmur heard.   No friction rub.  Pulmonary:     Effort: Pulmonary effort is normal. No respiratory distress.     Breath sounds: Normal breath sounds. No decreased breath sounds, wheezing or rales.   Chest:     Chest wall: No tenderness.  Abdominal:     General: There is no distension.  Palpations: Abdomen is soft.     Tenderness: There is no abdominal tenderness.  Musculoskeletal:     Cervical back: Normal range of motion.     Right lower leg: No edema.     Left lower leg: No edema.  Skin:    General: Skin is warm and dry.     Nails: There is no clubbing.  Neurological:     Mental Status: He is alert and oriented to person, place, and time.  Psychiatric:        Speech: Speech normal.        Behavior: Behavior normal.        Thought Content: Thought content normal.        Judgment: Judgment normal.    Wt Readings from Last 3 Encounters:  01/29/21 193 lb (87.5 kg)  01/17/21 192 lb (87.1 kg)  04/11/20 189 lb 4 oz (85.8 kg)     ASSESSMENT & PLAN:   CAD involving the native coronary arteries with accelerating angina: Currently chest pain-free.  Discussed noninvasive and invasive ischemic testing, given history and symptoms, it has been felt the best likely option would be to proceed directly with diagnostic LHC.  Continue aspirin and clopidogrel without interruption given his stents placed in the LAD and LCx previously.  He will also continue amlodipine and atorvastatin.  Risks and benefits of cardiac catheterization have been discussed with the patient including risks of bleeding, bruising, infection, kidney damage, stroke, heart attack, and death. The patient understands these risks and is willing to proceed with the procedure. All questions have been answered and concerns listened to.   History of HFrEF secondary to ICM: EF low normal earlier this year.  He appears euvolemic and well compensated.  Not currently on a beta-blocker secondary to heart rates in the upper 50s to low 60s bpm at baseline.  Fatigue: Possibly related to previous COVID infection.  Recent echo demonstrated a low normal LV systolic function.  Check CBC, TSH, and BMP.  HTN: Blood pressure is mildly  elevated in the office today.  Typically, this is reasonably controlled.  No changes were made at this time.  If blood pressure remains elevated post cath, consider addition of ARB.  HLD: LDL 74 in 02/2020 with goal being less than 70.  We will attempt to get labs from his PCPs office.  If an updated lipid panel is not available for review with these labs, recommend updating lipid panel in follow-up with recommendation to escalate cholesterol therapy as indicated to achieve target LDL.  PVCs: Quiescent.  Not on a standing beta-blocker with baseline rates in the upper 50s to low 60s bpm.  Disposition: F/u with Dr. Kirke Corin or an APP in 2 to 3 weeks, post cath.   Medication Adjustments/Labs and Tests Ordered: Current medicines are reviewed at length with the patient today.  Concerns regarding medicines are outlined above. Medication changes, Labs and Tests ordered today are summarized above and listed in the Patient Instructions accessible in Encounters.   Signed, Eula Listen, PA-C 01/29/2021 10:57 AM     [facility] [place] [address] [place], [place] [address] [phone]

## 2021-01-29 ENCOUNTER — Other Ambulatory Visit: Payer: Self-pay

## 2021-01-29 ENCOUNTER — Ambulatory Visit: Payer: Medicare Other | Admitting: Physician Assistant

## 2021-01-29 ENCOUNTER — Telehealth: Payer: Self-pay | Admitting: Physician Assistant

## 2021-01-29 VITALS — BP 158/88 | HR 61 | Ht 66.0 in | Wt 193.0 lb

## 2021-01-29 DIAGNOSIS — I2510 Atherosclerotic heart disease of native coronary artery without angina pectoris: Secondary | ICD-10-CM

## 2021-01-29 DIAGNOSIS — I493 Ventricular premature depolarization: Secondary | ICD-10-CM

## 2021-01-29 DIAGNOSIS — R079 Chest pain, unspecified: Secondary | ICD-10-CM

## 2021-01-29 NOTE — Telephone Encounter (Signed)
When walking patient out he wanted me to double check with provider regarding medications before his procedure.   Checked with provider and confirmed he is to continue plavix.   Left voicemail message for patient to call back for review of medications.

## 2021-01-29 NOTE — Patient Instructions (Addendum)
Medication Instructions:  No changes at this time.  *If you need a refill on your cardiac medications before your next appointment, please call your pharmacy*   Lab Work: CBC, BMET, and TSH today here in the office.   If you have labs (blood work) drawn today and your tests are completely normal, you will receive your results only by: MyChart Message (if you have MyChart) OR A paper copy in the mail If you have any lab test that is abnormal or we need to change your treatment, we will call you to review the results.   Testing/Procedures: [facility] Cardiac Cath Instructions  You are scheduled for a Cardiac Cath on: Monday November 7th with Dr. Kirke Corin Please arrive at 09:30 am on the day of your procedure Please expect a call from our [facility] to pre-register you Do not eat/drink anything after midnight Someone will need to drive you home It is recommended someone be with you for the first 24 hours after your procedure Wear clothes that are easy to get on/off and wear slip on shoes if possible   Medications bring a current list of all medications with you  _XX__ You may take all of your medications the morning of your procedure with enough water to swallow safely   Day of your procedure: Ar[facility] at the [facility] entrance.  Free valet service is available.  After entering the [facility] please check-in at the registration desk (1st desk on your right) to receive your armband. After receiving your armband someone will escort you to the cardiac cath/special procedures waiting area.  The usual length of stay after your procedure is about 2 to 3 hours.  This can vary.  If you have any questions, please call our office at [phone], or you may call the cardiac cath lab at [facility] directly at [phone]     Follow-Up: At [facility], you and your health needs are our priority.  As part of our continuing mission to provide you with exceptional heart care, we  have created designated Provider Care Teams.  These Care Teams include your primary Cardiologist (physician) and Advanced Practice Providers (APPs -  Physician Assistants and Nurse Practitioners) who all work together to provide you with the care you need, when you need it.   Your next appointment:   2-3 week(s) post procedure  The format for your next appointment:   In Person  Provider:   You may see Lorine Bears, MD or one of the following Advanced Practice Providers on your designated Care Team:   Nicolasa Ducking, NP Eula Listen, PA-C Marisue Ivan, PA-C Cadence Melrose, [place]

## 2021-01-30 LAB — CBC

## 2021-01-30 LAB — BASIC METABOLIC PANEL
BUN/Creatinine Ratio: 10 (ref 10–24)
BUN: 10 mg/dL (ref 8–27)
CO2: 25 mmol/L (ref 20–29)
Calcium: 9.4 mg/dL (ref 8.6–10.2)
Chloride: 100 mmol/L (ref 96–106)
Creatinine, Ser: 0.97 mg/dL (ref 0.76–1.27)
Glucose: 76 mg/dL (ref 70–99)
Potassium: 3.8 mmol/L (ref 3.5–5.2)
Sodium: 141 mmol/L (ref 134–144)
eGFR: 84 mL/min/{1.73_m2} (ref >59)

## 2021-01-30 LAB — TSH: TSH: 4.91 u[IU]/mL — ABNORMAL HIGH (ref 0.450–4.500)

## 2021-01-30 NOTE — Telephone Encounter (Signed)
Left voicemail message to call back for review of medications and his upcoming procedure and that I would send My Chart message as well with instructions to call back or reply to that message.

## 2021-01-30 NOTE — Telephone Encounter (Signed)
CBC was canceled due to insufficient sample.  Left voicemail message to call back to schedule appointment for repeat labs and for review of medication.

## 2021-01-31 NOTE — Telephone Encounter (Signed)
Patient returning call   can leave detailed msg if no ans .

## 2021-02-01 NOTE — Telephone Encounter (Signed)
Spoke with patient and reviewed information for his medication and also need for repeat lab. CBC did not have sufficient amount of blood to run test so scheduled him to come in on Monday to have that done. He verbalized understanding of our conversation with no further questions at this time.

## 2021-02-05 ENCOUNTER — Other Ambulatory Visit: Payer: Self-pay

## 2021-02-05 ENCOUNTER — Other Ambulatory Visit (INDEPENDENT_AMBULATORY_CARE_PROVIDER_SITE_OTHER): Payer: Medicare Other

## 2021-02-05 DIAGNOSIS — I2510 Atherosclerotic heart disease of native coronary artery without angina pectoris: Secondary | ICD-10-CM | POA: Diagnosis not present

## 2021-02-05 NOTE — Progress Notes (Signed)
1 TUBE DRAWN FOR CBC EDTA TUBE

## 2021-02-06 LAB — CBC
Hematocrit: 46.6 % (ref 37.5–51.0)
Hemoglobin: 15.6 g/dL (ref 13.0–17.7)
MCH: 29.8 pg (ref 26.6–33.0)
MCHC: 33.5 g/dL (ref 31.5–35.7)
MCV: 89 fL (ref 79–97)
Platelets: 153 10*3/uL (ref 150–450)
RBC: 5.24 x10E6/uL (ref 4.14–5.80)
RDW: 14 % (ref 11.6–15.4)
WBC: 8.6 10*3/uL (ref 3.4–10.8)

## 2021-02-07 DIAGNOSIS — M47816 Spondylosis without myelopathy or radiculopathy, lumbar region: Secondary | ICD-10-CM | POA: Diagnosis not present

## 2021-02-12 ENCOUNTER — Encounter
Admission: RE | Disposition: A | Discharge status: Home / Self Care | Payer: Self-pay | Source: Ambulatory Visit | Attending: Cardiovascular Disease

## 2021-02-12 ENCOUNTER — Encounter: Payer: Self-pay | Admitting: Cardiovascular Disease

## 2021-02-12 ENCOUNTER — Ambulatory Visit
Admission: RE | Admit: 2021-02-12 | Discharge: 2021-02-13 | Disposition: A | Discharge status: Home / Self Care | Payer: Medicare Other | Source: Ambulatory Visit | Attending: Cardiovascular Disease | Admitting: Cardiovascular Disease

## 2021-02-12 ENCOUNTER — Other Ambulatory Visit: Payer: Self-pay

## 2021-02-12 DIAGNOSIS — Z9861 Coronary angioplasty status: Secondary | ICD-10-CM | POA: Diagnosis not present

## 2021-02-12 DIAGNOSIS — R5383 Other fatigue: Secondary | ICD-10-CM | POA: Diagnosis not present

## 2021-02-12 DIAGNOSIS — Z955 Presence of coronary angioplasty implant and graft: Secondary | ICD-10-CM | POA: Insufficient documentation

## 2021-02-12 DIAGNOSIS — I110 Hypertensive heart disease with heart failure: Secondary | ICD-10-CM | POA: Insufficient documentation

## 2021-02-12 DIAGNOSIS — Z8616 Personal history of COVID-19: Secondary | ICD-10-CM | POA: Diagnosis not present

## 2021-02-12 DIAGNOSIS — I493 Ventricular premature depolarization: Secondary | ICD-10-CM | POA: Diagnosis not present

## 2021-02-12 DIAGNOSIS — F419 Anxiety disorder, unspecified: Secondary | ICD-10-CM | POA: Diagnosis not present

## 2021-02-12 DIAGNOSIS — M545 Low back pain, unspecified: Secondary | ICD-10-CM | POA: Insufficient documentation

## 2021-02-12 DIAGNOSIS — I255 Ischemic cardiomyopathy: Secondary | ICD-10-CM

## 2021-02-12 DIAGNOSIS — E785 Hyperlipidemia, unspecified: Secondary | ICD-10-CM | POA: Diagnosis present

## 2021-02-12 DIAGNOSIS — I25118 Atherosclerotic heart disease of native coronary artery with other forms of angina pectoris: Secondary | ICD-10-CM | POA: Insufficient documentation

## 2021-02-12 DIAGNOSIS — I5022 Chronic systolic (congestive) heart failure: Secondary | ICD-10-CM | POA: Diagnosis not present

## 2021-02-12 DIAGNOSIS — I2582 Chronic total occlusion of coronary artery: Secondary | ICD-10-CM | POA: Insufficient documentation

## 2021-02-12 DIAGNOSIS — I2089 Other forms of angina pectoris: Secondary | ICD-10-CM | POA: Diagnosis present

## 2021-02-12 DIAGNOSIS — Z8249 Family history of ischemic heart disease and other diseases of the circulatory system: Secondary | ICD-10-CM | POA: Insufficient documentation

## 2021-02-12 DIAGNOSIS — I10 Essential (primary) hypertension: Secondary | ICD-10-CM | POA: Diagnosis present

## 2021-02-12 DIAGNOSIS — I252 Old myocardial infarction: Secondary | ICD-10-CM | POA: Insufficient documentation

## 2021-02-12 DIAGNOSIS — I208 Other forms of angina pectoris: Secondary | ICD-10-CM | POA: Diagnosis present

## 2021-02-12 DIAGNOSIS — I2510 Atherosclerotic heart disease of native coronary artery without angina pectoris: Secondary | ICD-10-CM | POA: Diagnosis present

## 2021-02-12 DIAGNOSIS — G4733 Obstructive sleep apnea (adult) (pediatric): Secondary | ICD-10-CM | POA: Diagnosis not present

## 2021-02-12 HISTORY — PX: CORONARY STENT INTERVENTION: CATH118234

## 2021-02-12 HISTORY — PX: CORONARY PRESSURE WIRE/FFR WITH 3D MAPPING: CATH118309

## 2021-02-12 HISTORY — PX: LEFT HEART CATH AND CORONARY ANGIOGRAPHY: CATH118249

## 2021-02-12 LAB — POCT ACTIVATED CLOTTING TIME: Activated Clotting Time: 300 seconds

## 2021-02-12 SURGERY — LEFT HEART CATH AND CORONARY ANGIOGRAPHY
Anesthesia: Moderate Sedation

## 2021-02-12 MED ORDER — SODIUM CHLORIDE 0.9 % IV SOLN: 250.0000 mL | INTRAVENOUS | Status: DC | PRN

## 2021-02-12 MED ORDER — ASPIRIN EC 81 MG PO TBEC
81.0000 mg | DELAYED_RELEASE_TABLET | Freq: Every day | ORAL | Status: DC
  Administered 2021-02-13: 81 mg via ORAL
  Filled 2021-02-12: qty 1

## 2021-02-12 MED ORDER — LOSARTAN POTASSIUM-HCTZ 100-25 MG PO TABS: 1.0000 | ORAL_TABLET | Freq: Every day | ORAL | Status: DC

## 2021-02-12 MED ORDER — HEPARIN SODIUM (PORCINE) 1000 UNIT/ML IJ SOLN
INTRAMUSCULAR | Status: AC
  Filled 2021-02-12: qty 1

## 2021-02-12 MED ORDER — HEPARIN (PORCINE) IN NACL 1000-0.9 UT/500ML-% IV SOLN: INTRAVENOUS | Status: DC | PRN

## 2021-02-12 MED ORDER — SODIUM CHLORIDE 0.9 % WEIGHT BASED INFUSION
3.0000 mL/kg/h | INTRAVENOUS | Status: DC
  Administered 2021-02-12: 3 mL/kg/h via INTRAVENOUS

## 2021-02-12 MED ORDER — IOHEXOL 350 MG/ML SOLN
INTRAVENOUS | Status: DC | PRN
  Administered 2021-02-12: 90 mL

## 2021-02-12 MED ORDER — MIDAZOLAM HCL 2 MG/2ML IJ SOLN
INTRAMUSCULAR | Status: DC | PRN
  Administered 2021-02-12 (×2): 1 mg via INTRAVENOUS

## 2021-02-12 MED ORDER — VERAPAMIL HCL 2.5 MG/ML IV SOLN
INTRAVENOUS | Status: DC | PRN
  Administered 2021-02-12: 2.5 mg via INTRA_ARTERIAL

## 2021-02-12 MED ORDER — LIDOCAINE HCL (PF) 1 % IJ SOLN
INTRAMUSCULAR | Status: DC | PRN
  Administered 2021-02-12: 2 mL

## 2021-02-12 MED ORDER — HEPARIN (PORCINE) IN NACL 2000-0.9 UNIT/L-% IV SOLN
INTRAVENOUS | Status: DC | PRN
  Administered 2021-02-12: 1000 mL via INTRACORONARY

## 2021-02-12 MED ORDER — ZOLPIDEM TARTRATE 5 MG PO TABS
5.0000 mg | ORAL_TABLET | Freq: Every day | ORAL | Status: DC
  Administered 2021-02-12: 5 mg via ORAL
  Filled 2021-02-12: qty 1

## 2021-02-12 MED ORDER — HYDROCHLOROTHIAZIDE 25 MG PO TABS
25.0000 mg | ORAL_TABLET | Freq: Every day | ORAL | Status: DC
  Administered 2021-02-13: 25 mg via ORAL
  Filled 2021-02-12: qty 1

## 2021-02-12 MED ORDER — SODIUM CHLORIDE 0.9% FLUSH: 3.0000 mL | Freq: Two times a day (BID) | INTRAVENOUS | Status: DC

## 2021-02-12 MED ORDER — MIDAZOLAM HCL 2 MG/2ML IJ SOLN
INTRAMUSCULAR | Status: AC
  Filled 2021-02-12: qty 2

## 2021-02-12 MED ORDER — VERAPAMIL HCL 2.5 MG/ML IV SOLN
INTRAVENOUS | Status: AC
  Filled 2021-02-12: qty 2

## 2021-02-12 MED ORDER — ALPRAZOLAM 0.5 MG PO TABS: 0.5000 mg | ORAL_TABLET | Freq: Two times a day (BID) | ORAL | Status: DC | PRN

## 2021-02-12 MED ORDER — SODIUM CHLORIDE 0.9 % WEIGHT BASED INFUSION: 1.0000 mL/kg/h | INTRAVENOUS | Status: DC

## 2021-02-12 MED ORDER — HEPARIN SODIUM (PORCINE) 1000 UNIT/ML IJ SOLN
INTRAMUSCULAR | Status: DC | PRN
  Administered 2021-02-12: 5000 [IU] via INTRAVENOUS
  Administered 2021-02-12: 4000 [IU] via INTRAVENOUS

## 2021-02-12 MED ORDER — LIDOCAINE HCL 1 % IJ SOLN
INTRAMUSCULAR | Status: AC
  Filled 2021-02-12: qty 20

## 2021-02-12 MED ORDER — CLOPIDOGREL BISULFATE 75 MG PO TABS
ORAL_TABLET | ORAL | Status: AC
  Filled 2021-02-12: qty 4

## 2021-02-12 MED ORDER — SODIUM CHLORIDE 0.9% FLUSH: 3.0000 mL | INTRAVENOUS | Status: DC | PRN

## 2021-02-12 MED ORDER — FENTANYL CITRATE (PF) 100 MCG/2ML IJ SOLN
INTRAMUSCULAR | Status: DC | PRN
  Administered 2021-02-12 (×2): 25 ug via INTRAVENOUS

## 2021-02-12 MED ORDER — OXYCODONE HCL 5 MG PO TABS
10.0000 mg | ORAL_TABLET | Freq: Three times a day (TID) | ORAL | Status: DC
  Administered 2021-02-12 – 2021-02-13 (×2): 10 mg via ORAL
  Filled 2021-02-12 (×2): qty 2

## 2021-02-12 MED ORDER — FENTANYL CITRATE (PF) 100 MCG/2ML IJ SOLN
INTRAMUSCULAR | Status: AC
  Filled 2021-02-12: qty 2

## 2021-02-12 MED ORDER — DIPHENHYDRAMINE HCL 25 MG PO CAPS: 50.0000 mg | ORAL_CAPSULE | Freq: Every evening | ORAL | Status: DC | PRN

## 2021-02-12 MED ORDER — SERTRALINE HCL 50 MG PO TABS
75.0000 mg | ORAL_TABLET | Freq: Every day | ORAL | Status: DC
  Administered 2021-02-13: 75 mg via ORAL
  Filled 2021-02-12: qty 1

## 2021-02-12 MED ORDER — ATORVASTATIN CALCIUM 80 MG PO TABS: 80.0000 mg | ORAL_TABLET | Freq: Every evening | ORAL | Status: DC

## 2021-02-12 MED ORDER — HEPARIN (PORCINE) IN NACL 1000-0.9 UT/500ML-% IV SOLN
INTRAVENOUS | Status: AC
  Filled 2021-02-12: qty 1000

## 2021-02-12 MED ORDER — ASPIRIN EC 81 MG PO TBEC: 81.0000 mg | DELAYED_RELEASE_TABLET | Freq: Every day | ORAL | Status: DC

## 2021-02-12 MED ORDER — CLOPIDOGREL BISULFATE 75 MG PO TABS
75.0000 mg | ORAL_TABLET | Freq: Every day | ORAL | Status: DC
  Administered 2021-02-13: 75 mg via ORAL
  Filled 2021-02-12: qty 1

## 2021-02-12 MED ORDER — AMLODIPINE BESYLATE 5 MG PO TABS
10.0000 mg | ORAL_TABLET | Freq: Every day | ORAL | Status: DC
  Administered 2021-02-13: 10 mg via ORAL
  Filled 2021-02-12: qty 2

## 2021-02-12 MED ORDER — TAMSULOSIN HCL 0.4 MG PO CAPS
0.4000 mg | ORAL_CAPSULE | Freq: Every day | ORAL | Status: DC
  Administered 2021-02-12: 0.4 mg via ORAL
  Filled 2021-02-12: qty 1

## 2021-02-12 MED ORDER — ONDANSETRON HCL 4 MG/2ML IJ SOLN: 4.0000 mg | Freq: Four times a day (QID) | INTRAMUSCULAR | Status: DC | PRN

## 2021-02-12 MED ORDER — SODIUM CHLORIDE 0.9% FLUSH
3.0000 mL | Freq: Two times a day (BID) | INTRAVENOUS | Status: DC
  Administered 2021-02-12 – 2021-02-13 (×2): 3 mL via INTRAVENOUS

## 2021-02-12 MED ORDER — CLOPIDOGREL BISULFATE 75 MG PO TABS
ORAL_TABLET | ORAL | Status: DC | PRN
  Administered 2021-02-12: 300 mg via ORAL

## 2021-02-12 MED ORDER — LOSARTAN POTASSIUM 50 MG PO TABS
100.0000 mg | ORAL_TABLET | Freq: Every day | ORAL | Status: DC
  Administered 2021-02-13: 100 mg via ORAL
  Filled 2021-02-12: qty 2

## 2021-02-12 MED ORDER — SODIUM CHLORIDE 0.9 % WEIGHT BASED INFUSION
1.0000 mL/kg/h | INTRAVENOUS | Status: DC
  Administered 2021-02-12: 1 mL/kg/h via INTRAVENOUS

## 2021-02-12 MED ORDER — ACETAMINOPHEN 500 MG PO TABS: 1000.0000 mg | ORAL_TABLET | Freq: Three times a day (TID) | ORAL | Status: DC | PRN

## 2021-02-12 MED ORDER — CALCIUM POLYCARBOPHIL 625 MG PO TABS
1250.0000 mg | ORAL_TABLET | Freq: Every day | ORAL | Status: DC
  Administered 2021-02-13: 1250 mg via ORAL
  Filled 2021-02-12 (×2): qty 2

## 2021-02-12 SURGICAL SUPPLY — 17 items
BALLN ~~LOC~~ TREK RX 3.25X12 (BALLOONS) ×3
BALLOON ~~LOC~~ TREK RX 3.25X12 (BALLOONS) ×2 IMPLANT
CATH INFINITI 5FR JK (CATHETERS) ×3 IMPLANT
CATH LAUNCHER 6FR EBU3.5 (CATHETERS) ×3 IMPLANT
DEVICE RAD TR BAND REGULAR (VASCULAR PRODUCTS) ×3 IMPLANT
DRAPE BRACHIAL (DRAPES) ×3 IMPLANT
GLIDESHEATH SLEND SS 6F .021 (SHEATH) ×3 IMPLANT
GUIDEWIRE INQWIRE 1.5J.035X260 (WIRE) ×2 IMPLANT
GUIDEWIRE PRESS OMNI 185 ST (WIRE) ×3 IMPLANT
INQWIRE 1.5J .035X260CM (WIRE) ×3
KIT ENCORE 26 ADVANTAGE (KITS) ×3 IMPLANT
PACK CARDIAC CATH (CUSTOM PROCEDURE TRAY) ×3 IMPLANT
PROTECTION STATION PRESSURIZED (MISCELLANEOUS) ×3
SET ATX SIMPLICITY (MISCELLANEOUS) ×3 IMPLANT
STATION PROTECTION PRESSURIZED (MISCELLANEOUS) ×2 IMPLANT
STENT ONYX FRONTIER 3.0X18 (Permanent Stent) ×3 IMPLANT
TUBING CIL FLEX 10 FLL-RA (TUBING) ×3 IMPLANT

## 2021-02-12 NOTE — Interval H&P Note (Signed)
History and Physical Interval Note:  02/12/2021 12:10 PM  Terry Cook  has presented today for surgery, with the diagnosis of LT Cath   CAD.  The various methods of treatment have been discussed with the patient and family. After consideration of risks, benefits and other options for treatment, the patient has consented to  Procedure(s): LEFT HEART CATH AND CORONARY ANGIOGRAPHY (Left) as a surgical intervention.  The patient's history has been reviewed, patient examined, no change in status, stable for surgery.  I have reviewed the patient's chart and labs.  Questions were answered to the patient's satisfaction.     Lorine Bears

## 2021-02-13 DIAGNOSIS — I252 Old myocardial infarction: Secondary | ICD-10-CM | POA: Diagnosis not present

## 2021-02-13 DIAGNOSIS — Z8249 Family history of ischemic heart disease and other diseases of the circulatory system: Secondary | ICD-10-CM | POA: Diagnosis not present

## 2021-02-13 DIAGNOSIS — I25118 Atherosclerotic heart disease of native coronary artery with other forms of angina pectoris: Secondary | ICD-10-CM | POA: Diagnosis not present

## 2021-02-13 DIAGNOSIS — M545 Low back pain, unspecified: Secondary | ICD-10-CM | POA: Diagnosis not present

## 2021-02-13 DIAGNOSIS — Z955 Presence of coronary angioplasty implant and graft: Secondary | ICD-10-CM | POA: Diagnosis not present

## 2021-02-13 DIAGNOSIS — I2582 Chronic total occlusion of coronary artery: Secondary | ICD-10-CM | POA: Diagnosis not present

## 2021-02-13 DIAGNOSIS — I255 Ischemic cardiomyopathy: Secondary | ICD-10-CM | POA: Diagnosis not present

## 2021-02-13 DIAGNOSIS — E785 Hyperlipidemia, unspecified: Secondary | ICD-10-CM | POA: Diagnosis not present

## 2021-02-13 DIAGNOSIS — R5383 Other fatigue: Secondary | ICD-10-CM | POA: Diagnosis not present

## 2021-02-13 DIAGNOSIS — Z8616 Personal history of COVID-19: Secondary | ICD-10-CM | POA: Diagnosis not present

## 2021-02-13 DIAGNOSIS — I5022 Chronic systolic (congestive) heart failure: Secondary | ICD-10-CM | POA: Diagnosis not present

## 2021-02-13 DIAGNOSIS — F419 Anxiety disorder, unspecified: Secondary | ICD-10-CM | POA: Diagnosis not present

## 2021-02-13 DIAGNOSIS — I110 Hypertensive heart disease with heart failure: Secondary | ICD-10-CM | POA: Diagnosis not present

## 2021-02-13 DIAGNOSIS — I208 Other forms of angina pectoris: Secondary | ICD-10-CM

## 2021-02-13 DIAGNOSIS — G4733 Obstructive sleep apnea (adult) (pediatric): Secondary | ICD-10-CM | POA: Diagnosis not present

## 2021-02-13 DIAGNOSIS — I493 Ventricular premature depolarization: Secondary | ICD-10-CM | POA: Diagnosis not present

## 2021-02-13 LAB — BASIC METABOLIC PANEL
Anion gap: 7 (ref 5–15)
BUN: 14 mg/dL (ref 8–23)
CO2: 29 mmol/L (ref 22–32)
Calcium: 7.4 mg/dL — ABNORMAL LOW (ref 8.9–10.3)
Chloride: 102 mmol/L (ref 98–111)
Creatinine, Ser: 0.93 mg/dL (ref 0.61–1.24)
GFR, Estimated: >60 mL/min (ref >60)
Glucose, Bld: 88 mg/dL (ref 70–99)
Potassium: 2.9 mmol/L — ABNORMAL LOW (ref 3.5–5.1)
Sodium: 138 mmol/L (ref 135–145)

## 2021-02-13 LAB — CBC
HCT: 40.5 % (ref 39.0–52.0)
Hemoglobin: 14.1 g/dL (ref 13.0–17.0)
MCH: 30.6 pg (ref 26.0–34.0)
MCHC: 34.8 g/dL (ref 30.0–36.0)
MCV: 87.9 fL (ref 80.0–100.0)
Platelets: 125 10*3/uL — ABNORMAL LOW (ref 150–400)
RBC: 4.61 MIL/uL (ref 4.22–5.81)
RDW: 13.2 % (ref 11.5–15.5)
WBC: 9.5 10*3/uL (ref 4.0–10.5)
nRBC: 0 % (ref 0.0–0.2)

## 2021-02-13 MED ORDER — POTASSIUM CHLORIDE CRYS ER 20 MEQ PO TBCR
40.0000 meq | EXTENDED_RELEASE_TABLET | ORAL | Status: AC
  Administered 2021-02-13 (×2): 40 meq via ORAL
  Filled 2021-02-13: qty 2
  Filled 2021-02-13: qty 4

## 2021-02-13 NOTE — Discharge Summary (Signed)
Discharge Summary    Patient ID: Terry Cook  MRN: [id number], DOB/AGE: 70-Apr-1952 70 y.o.  Admit Date: 02/12/2021 Discharge Date: 02/13/2021  Primary Care Provider: Vernie Shanks, MD Primary Cardiologist: Dr. Fletcher Anon, MD  Discharge Diagnoses    Active Problems:   Hypertension   Hyperlipidemia   CAD (coronary artery disease), native coronary artery   Stable angina [facility])   Ischemic cardiomyopathy   Allergies No Known Allergies   History of Present Illness     70 y.o. male with history of CAD with prior MI in 2013 status post PCI to the LCx with unstable angina in 2015 s/p PCI to the LAD, ICM with subsequent improvement in LV systolic function, strong family history of premature coronary artery disease, COVID x2, most recently approximately in 10/2020, PVCs, HTN, HLD, anxiety, BPH, OSA on CPAP, arthritis, and chronic low back pain on oxycodone who was seen in the office on 01/29/2021 with angina and fatigue.   He was admitted with an MI in 2013.  He underwent PCI to the LCx and was found to have a chronically occluded RCA with left-to-right collaterals at that time.  EF at that time was 40 to 45%.  He presented with unstable angina in 2015 and was found to have a patent LCx stent, though there was 90% stenosis in the mid LAD which was treated successfully with PCI/DES.  He has not had any cardiac events since then.  He was last seen in the office in 04/2020 and was doing reasonably well.  Over the preceding year, he did report 2 brief episodes of chest discomfort, the most recent was in the summer 2021.  There were no exertional symptoms with recommendation to continue medical therapy.  Given he had not had an echo since his MI in 2013, he underwent echo in 04/2020 which showed an improvement in his LV systolic function with a low normal EF of 50 to 55%, possible basal inferior wall hypokinesis, mild LVH, grade 1 diastolic dysfunction, normal RV systolic function and ventricular cavity  size, moderately dilated left atrium, and no noted significant valvular abnormalities.  He was seen in the office on 01/29/2021 noting intermittent randomly occurring profound fatigue/exhaustion as well as chest pressure. These symptoms felt similar to his prior angina. Given symptoms, it was recommended he proceed directly to cardiac cath.    Hospital Course     Consultants: cardiac rehab   He presented to [facility] for planned outpatient LHC which showed significant 3-vessel CAD with patent stents in the LAD and LCx. The RCA was chronically occluded with left-to-right collaterals. There was 70% stenosis in the proximal/mid LCx at the proximal edge of the previously placed stent. This was significant by fractional flow reserve evaluation with an iFR ratio of 0.85. There was also moderate disease in the LCx distal to the old stent. This was not significant by fractional flow reserve evaluation after treating the more proximal lesion. The OM1 was small in diameter with severe diffuse disease. Normal LV systolic function and high normal left ventricular end-diastolic pressure. He underwent successful drug-eluting stent placement to the proximal/mid LCx overlapping the previously placed stent.  Post cath, he feels much better and is without angina. He reports he feels "10 years younger." He has ambulated without issues. Post cath vitals are stable. He was hypokalemic this morning and received repletion prior to discharge.   CAD involving the native coronary arteries with stable angina: -Chest pain free -Continue DAPT with ASA and  Plavix indefinitely, if tolerated, given lultiple stents -Lipitor -Losartan -Aggressive risk factor modification -Post cath instructions -Cardiac rehab  History of HFrEF secondary to ICM: -Appears euvolemic and well compensated -Not on a beta blocker given resting sinus bradycardia -Losartan  HTN: -Blood pressure is well controlled -Continue losartan, HCTZ and  amlodipine  HLD: -LDL 74 in 02/2020 with goal LDL < 70 -Lipitor 80 mg -Follow up lipid panel in the office with recommendation to escalate lipid therapy as indicated to achieve target LDL  Hypokalemia: -Repletion given prior to discharge -Follow up BMP in the office  PVCs: -Quiescent -Not on a standing beta blocker due to resting heart rates in the 50s to low 60s bpm  OSA: -CPAP  There were no changes to his noncardiac medications, and these were continued at discharge at his prior to admission instructions.    The patient's right radial cath site has been examined is healing well without issues at this time. The patient has been seen by Dr. Saunders Revel and felt to be stable for discharge today. All follow up appointments have been made. Discharge medications are listed below. Prescriptions have been reviewed with the patient and printed.  _____________  Discharge Vitals Blood pressure (!) 112/56, pulse (!) 58, temperature 98.2 F (36.8 C), resp. rate 16, height 5' 6" (1.676 m), weight 86.9 kg, SpO2 94 %.  Filed Weights   02/12/21 1012 02/12/21 1633  Weight: 86.6 kg 86.9 kg   Physical Exam   GEN: No acute distress.   Neck: No JVD. Cardiac: RRR, no murmurs, rubs, or gallops. Right radial cardiac cath site is without bleeding, swelling, warmth, hematoma, or TTP. Radial pulse 2+ proximal and distal to the arteriotomy site.  Respiratory: Clear to auscultation bilaterally.  GI: Soft, nontender, non-distended.   MS: No edema; No deformity. Neuro:  Alert and oriented x 3; Nonfocal.  Psych: Normal affect.  Labs & Radiologic Studies    CBC Recent Labs    02/13/21 0537  WBC 9.5  HGB 14.1  HCT 40.5  MCV 87.9  PLT 017*   Basic Metabolic Panel Recent Labs    02/13/21 0537  NA 138  K 2.9*  CL 102  CO2 29  GLUCOSE 88  BUN 14  CREATININE 0.93  CALCIUM 7.4*   Liver Function Tests No results for input(s): AST, ALT, ALKPHOS, BILITOT, PROT, ALBUMIN in the last 72 hours. No  results for input(s): LIPASE, AMYLASE in the last 72 hours. Cardiac Enzymes No results for input(s): CKTOTAL, CKMB, CKMBINDEX, TROPONINI in the last 72 hours. BNP Invalid input(s): POCBNP D-Dimer No results for input(s): DDIMER in the last 72 hours. Hemoglobin A1C No results for input(s): HGBA1C in the last 72 hours. Fasting Lipid Panel No results for input(s): CHOL, HDL, LDLCALC, TRIG, CHOLHDL, LDLDIRECT in the last 72 hours. Thyroid Function Tests No results for input(s): TSH, T4TOTAL, T3FREE, THYROIDAB in the last 72 hours.  Invalid input(s): FREET3 _____________   Diagnostic Studies/Procedures   LHC 02/12/2021:   Prox RCA to Mid RCA lesion is 100% stenosed.   Ost RCA to Prox RCA lesion is 50% stenosed.   Mid Cx lesion is 10% stenosed.   Mid Cx to Dist Cx lesion is 50% stenosed.   3rd Mrg lesion is 40% stenosed.   1st Mrg lesion is 90% stenosed.   Prox LAD to Mid LAD lesion is 40% stenosed.   Prox Cx to Mid Cx lesion is 70% stenosed.   Previously placed Mid LAD stent (unknown type) is  widely patent.   A drug-eluting stent was successfully placed using a STENT ONYX FRONTIER 3.0X18.   Post intervention, there is a 0% residual stenosis.   The left ventricular systolic function is normal.   LV end diastolic pressure is normal.   The left ventricular ejection fraction is 55-65% by visual estimate.   1.  Significant underlying three-vessel coronary artery disease with patent stents in the left circumflex and LAD.  Chronically occluded right coronary artery with left-to-right collaterals.  There is 70% stenosis in the proximal/mid left circumflex at the proximal edge of the previously placed stent.  This was significant by fractional flow reserve evaluation with an IFR ratio of 0.85.  There is also moderate disease in the left circumflex distal to the old stent.  This was not significant by fractional flow reserve evaluation after treating the more proximal lesion.  OM1 is small in  diameter with severe diffuse disease.  2.  Normal LV systolic function and high normal left ventricular end-diastolic pressure. 3.  Successful drug-eluting stent placement to the proximal/mid left circumflex overlapping the previously placed stent.   Recommendations: Continue dual antiplatelet therapy indefinitely if tolerated given multiple stents. Aggressive treatment of risk factors.    Diagnostic Dominance: Right Left Main  Vessel is angiographically normal.  Left Anterior Descending  Prox LAD to Mid LAD lesion is 40% stenosed.  Previously placed Mid LAD stent (unknown type) is widely patent.  Left Circumflex  Prox Cx to Mid Cx lesion is 70% stenosed. The lesion is not complex (non high-C). The lesion was not previously treated . Pressure wire/FFR was performed on the lesion. Fractional flow reserve evaluation was performed on the lesions in the left circumflex. There was a lesion proximal to the previously placed stent with an IFR ratio of 0.85 this persisted to be abnormal even after pulling the wire back to account only for this lesion. There was moderate disease distal to the previously placed stent and these were not significant by IFR after intervening on the proximal lesion with an IFR ratio of 0.93.  Mid Cx lesion is 10% stenosed. The lesion was previously treated .  Mid Cx to Dist Cx lesion is 50% stenosed.  First Obtuse Marginal Branch  1st Mrg lesion is 90% stenosed.  Second Obtuse Marginal Branch  There is mild disease in the vessel.  Third Obtuse Marginal Branch  3rd Mrg lesion is 40% stenosed.  Right Coronary Artery  Ost RCA to Prox RCA lesion is 50% stenosed.  Prox RCA to Mid RCA lesion is 100% stenosed.  Right Posterior Descending Artery  Collaterals  RPDA filled by collaterals from Dist LAD.    Third Right Posterolateral Branch  Collaterals  3rd RPL filled by collaterals from Dist Cx.    Intervention  Prox Cx to Mid Cx lesion  Stent  Lesion length: 13  mm. CATH LAUNCHER 6FR EBU3.5 guide catheter was inserted. Lesion crossed with guidewire using a GUIDEWIRE PRESS OMNI 185 ST. Pre-stent angioplasty was not performed. A drug-eluting stent was successfully placed using a STENT ONYX FRONTIER 3.0X18. Maximum pressure: 14 atm. Inflation time: 20 sec. Post-stent angioplasty was performed using a BALLN [employer] RX 9.02I09. Maximum pressure: 14 atm. Inflation time: 20 sec.  Post-Intervention Lesion Assessment  The intervention was successful. Pre-interventional TIMI flow is 3. Post-intervention TIMI flow is 3.  There is a 0% residual stenosis post intervention.   Left Heart  Left Ventricle The left ventricular size is normal. The left ventricular systolic function is  normal. LV end diastolic pressure is normal. The left ventricular ejection fraction is 55-65% by visual estimate. No regional wall motion abnormalities.   Coronary Diagrams  Diagnostic Dominance: Right Intervention   _____________  Disposition   Pt is being discharged home today in good condition.  Follow-up Plans & Appointments     Follow-up Information     Rise Mu, PA-C Follow up on 02/26/2021.   Specialties: Librarian, academic, Cardiology, Radiology Why: Appointment time: 9:15 AM Contact information: [address] [place] [address] [phone]                Discharge Instructions     AMB Referral to Cardiac Rehabilitation - Phase II   Complete by: As directed    Diagnosis:  Coronary Stents Stable Angina     After initial evaluation and assessments completed: Virtual Based Care may be provided alone or in conjunction with Phase 2 Cardiac Rehab based on patient barriers.: Yes   Diet - low sodium heart healthy   Complete by: As directed    Increase activity slowly   Complete by: As directed        Discharge Medications   Allergies as of 02/13/2021   No Known Allergies      Medication List     TAKE these medications     acetaminophen 500 MG tablet Commonly known as: TYLENOL Take 1,000 mg by mouth every 8 (eight) hours as needed for mild pain or moderate pain.   ALPRAZolam 0.5 MG tablet Commonly known as: XANAX Take 0.5 mg by mouth 2 (two) times daily as needed for anxiety.   amLODipine 10 MG tablet Commonly known as: NORVASC Take 1 tablet (10 mg total) by mouth daily.   aspirin EC 81 MG tablet Take 81 mg by mouth daily.   atorvastatin 80 MG tablet Commonly known as: LIPITOR Take 1 tablet (80 mg total) by mouth every evening.   clopidogrel 75 MG tablet Commonly known as: PLAVIX Take 1 tablet (75 mg total) by mouth daily.   diclofenac sodium 1 % Gel Commonly known as: VOLTAREN Apply 1 application topically 3 (three) times daily. Apply to feet   losartan-hydrochlorothiazide 100-25 MG tablet Commonly known as: HYZAAR Take 1 tablet by mouth daily.   Oxycodone HCl 10 MG Tabs Take 10 mg by mouth 3 (three) times daily.   polycarbophil 625 MG tablet Commonly known as: FIBERCON Take 1,250 mg by mouth daily.   sertraline 25 MG tablet Commonly known as: ZOLOFT Take 75 mg by mouth daily.   silodosin 8 MG Caps capsule Commonly known as: RAPAFLO Take 1 capsule (8 mg total) by mouth daily with breakfast.   zolpidem 10 MG tablet Commonly known as: AMBIEN Take 10 mg by mouth at bedtime.       Aspirin prescribed at discharge?  Yes High Intensity Statin Prescribed? (Lipitor 40-45m or Crestor 20-454m: Yes Beta Blocker Prescribed? No: Bradycardia For EF <40%, was ACEI/ARB Prescribed? No: EF > 40% ADP Receptor Inhibitor Prescribed? (i.e. Plavix etc.-Includes Medically Managed Patients): Yes For EF <40%, Aldosterone Inhibitor Prescribed? No: EF > 40% Was EF assessed during THIS hospitalization? Yes Was Cardiac Rehab II ordered? (Included Medically managed Patients): Yes   Outstanding Labs/Studies   None.   Duration of Discharge Encounter   Greater than 30 minutes including physician  time.  Signed, RyRise MuPA-C [facility] Pager: [phone]/11/2020, 7:48 AM

## 2021-02-16 ENCOUNTER — Encounter: Payer: Medicare Other | Attending: Cardiovascular Disease | Admitting: *Deleted

## 2021-02-16 ENCOUNTER — Other Ambulatory Visit: Payer: Self-pay

## 2021-02-16 DIAGNOSIS — Z955 Presence of coronary angioplasty implant and graft: Secondary | ICD-10-CM

## 2021-02-16 NOTE — Progress Notes (Signed)
Virtual orientation call completed today. he has an appointment on Date: 03/05/2021  for EP eval and gym Orientation.  Documentation of diagnosis can be found in [facility] Date: 02/12/21 .

## 2021-02-26 ENCOUNTER — Encounter: Payer: Self-pay | Admitting: Physician Assistant

## 2021-02-26 ENCOUNTER — Other Ambulatory Visit: Payer: Self-pay

## 2021-02-26 ENCOUNTER — Ambulatory Visit: Payer: Medicare Other | Admitting: Physician Assistant

## 2021-02-26 VITALS — BP 142/82 | HR 74 | Ht 66.0 in | Wt 193.0 lb

## 2021-02-26 DIAGNOSIS — I10 Essential (primary) hypertension: Secondary | ICD-10-CM | POA: Diagnosis not present

## 2021-02-26 DIAGNOSIS — I255 Ischemic cardiomyopathy: Secondary | ICD-10-CM

## 2021-02-26 DIAGNOSIS — Z9989 Dependence on other enabling machines and devices: Secondary | ICD-10-CM

## 2021-02-26 DIAGNOSIS — E785 Hyperlipidemia, unspecified: Secondary | ICD-10-CM | POA: Diagnosis not present

## 2021-02-26 DIAGNOSIS — I2510 Atherosclerotic heart disease of native coronary artery without angina pectoris: Secondary | ICD-10-CM | POA: Diagnosis not present

## 2021-02-26 DIAGNOSIS — E876 Hypokalemia: Secondary | ICD-10-CM

## 2021-02-26 DIAGNOSIS — I493 Ventricular premature depolarization: Secondary | ICD-10-CM

## 2021-02-26 DIAGNOSIS — G4733 Obstructive sleep apnea (adult) (pediatric): Secondary | ICD-10-CM

## 2021-02-26 NOTE — Progress Notes (Signed)
Cardiology Office Note    Date:  02/26/2021   ID:  Terry Cook, DOB [birth year], MRN [id number]  PCP:  Ileana Ladd, MD  Cardiologist:  Lorine Bears, MD  Electrophysiologist:  None   Chief Complaint: Hospital follow-up  History of Present Illness:   Terry Cook is a 70 y.o. male with history of CAD with prior MI in 2013 status post PCI to the LCx with unstable angina in 2015 s/p PCI to the LAD, ICM with subsequent improvement in LV systolic function, strong family history of premature coronary artery disease, COVID x2 most recently approximately in 10/2020, PVCs, HTN, HLD, anxiety, BPH, OSA on CPAP, arthritis, and chronic low back pain on oxycodone who presents for [facility] follow-up.    He was admitted with an MI in 2013.  He underwent PCI to the LCx and was found to have a chronically occluded RCA with left-to-right collaterals at that time.  EF at that time was 40 to 45%.  He presented with unstable angina in 2015 and was found to have a patent LCx stent, though there was 90% stenosis in the mid LAD which was treated successfully with PCI/DES.  He was seen in the office in 04/2020 and was doing reasonably well.  Over the preceding year, he did report 2 brief episodes of chest discomfort.  There were no exertional symptoms with recommendation to continue medical therapy.  Given he had not had an echo since his MI in 2013, he underwent echo in 04/2020 which showed an improvement in his LV systolic function with a low normal EF of 50 to 55%, possible basal inferior wall hypokinesis, mild LVH, grade 1 diastolic dysfunction, normal RV systolic function and ventricular cavity size, moderately dilated left atrium, and no noted significant valvular abnormalities.  He was seen in the office on 01/29/2021 noting intermittent randomly occurring profound fatigue/exhaustion as well as chest pressure. These symptoms felt similar to his prior angina. Given symptoms, he underwent LHC on 02/12/2021 which  demonstrated significant 3-vessel CAD with patent stents in the LAD and LCx. The RCA was chronically occluded with left-to-right collaterals. There was 70% stenosis in the proximal/mid LCx at the proximal edge of the previously placed stent. This was significant by fractional flow reserve evaluation with an iFR ratio of 0.85. There was also moderate disease in the LCx distal to the old stent. This was not significant by fractional flow reserve evaluation after treating the more proximal lesion. The OM1 was small in diameter with severe diffuse disease. Normal LV systolic function and high normal left ventricular end-diastolic pressure. He underwent successful drug-eluting stent placement to the proximal/mid LCx overlapping the previously placed stent.  Post cath, he felt much better, indicating he felt "10 years younger."  He comes in doing well from a cardiac perspective.  No symptoms suggestive of angina, or dyspnea.  He continues to feel significantly improved compared to how he was feeling prior to his most recent LHC with PCI.  No falls, hematochezia, or melena.  No lower extremity swelling or orthopnea.  He is tolerating all medications without issues.  He has noted his blood pressure typically runs in the 140s to 150s systolic over the past 12 months.  He has been adherent to all medications and tries to follow a low-sodium diet.  He does feel like he may need another sleep study.  No issues from right radial arteriotomy site.   Labs independently reviewed: 02/2020 - AST/ALT normal, TC 130, TG 106, HDL  34, LDL 77, Hgb 14.1, PLT 125, BUN 14, serum creatinine 0.93, potassium 2.9  Past Medical History:  Diagnosis Date   Anxiety    CAD (coronary artery disease), native coronary artery 06/04/2011   s/p MI with PCI to left circ   Chronic lower back pain    DDD (degenerative disc disease), lumbar    Depression    High cholesterol    History of kidney stones    Hypertension    Irregular heart beats     MI (myocardial infarction) (HCC) 05/2011   OSA on CPAP     Past Surgical History:  Procedure Laterality Date   COLONOSCOPY     CORONARY ANGIOPLASTY WITH STENT PLACEMENT  05/2011   "1"   CORONARY PRESSURE WIRE/FFR WITH 3D MAPPING N/A 02/12/2021   Procedure: Coronary Pressure Wire/FFR w/3D Mapping;  Surgeon: Iran Ouch, MD;  Location: [facility] INVASIVE CV LAB;  Service: Cardiovascular;  Laterality: N/A;   CORONARY STENT INTERVENTION N/A 02/12/2021   Procedure: CORONARY STENT INTERVENTION;  Surgeon: Iran Ouch, MD;  Location: [facility] INVASIVE CV LAB;  Service: Cardiovascular;  Laterality: N/A;   CYST EXCISION Right ~ 2009   "index finger; in office"   CYSTOSCOPY W/ STONE MANIPULATION  ~ 1975 X 2   LEFT HEART CATH AND CORONARY ANGIOGRAPHY Left 02/12/2021   Procedure: LEFT HEART CATH AND CORONARY ANGIOGRAPHY;  Surgeon: Iran Ouch, MD;  Location: [facility] INVASIVE CV LAB;  Service: Cardiovascular;  Laterality: Left;   LEFT HEART CATHETERIZATION WITH CORONARY ANGIOGRAM N/A 06/04/2011   Procedure: LEFT HEART CATHETERIZATION WITH CORONARY ANGIOGRAM;  Surgeon: Othella Boyer, MD;  Location: [facility] CATH LAB;  Service: Cardiovascular;  Laterality: N/A;   LEFT HEART CATHETERIZATION WITH CORONARY ANGIOGRAM N/A 11/18/2013   Procedure: LEFT HEART CATHETERIZATION WITH CORONARY ANGIOGRAM;  Surgeon: Lesleigh Noe, MD;  Location: [facility] CATH LAB;  Service: Cardiovascular;  Laterality: N/A;   PERCUTANEOUS CORONARY STENT INTERVENTION (PCI-S)  11/18/2013   Procedure: PERCUTANEOUS CORONARY STENT INTERVENTION (PCI-S);  Surgeon: Lesleigh Noe, MD;  Location: [facility] CATH LAB;  Service: Cardiovascular;;   SHOULDER ARTHROSCOPY WITH OPEN ROTATOR CUFF REPAIR Right 11/24/2018   Procedure: SHOULDER ARTHROSCOPY WITH OPEN MINI ROTATOR CUFF REPAIR, SUBACROMIAL DECOMPRESSION, DISTAL CLAVICLE EXCISION, BICEPS TENODESIS;  Surgeon: Juanell Fairly, MD;  Location: [facility] ORS;  Service: Orthopedics;  Laterality: Right;     Current Medications: Current Meds  Medication Sig   acetaminophen (TYLENOL) 500 MG tablet Take 1,000 mg by mouth every 8 (eight) hours as needed for mild pain or moderate pain.   ALPRAZolam (XANAX) 0.5 MG tablet Take 0.5 mg by mouth 2 (two) times daily as needed for anxiety.   amLODipine (NORVASC) 10 MG tablet Take 1 tablet (10 mg total) by mouth daily.   aspirin EC 81 MG tablet Take 81 mg by mouth daily.   atorvastatin (LIPITOR) 80 MG tablet Take 1 tablet (80 mg total) by mouth every evening.   clopidogrel (PLAVIX) 75 MG tablet Take 1 tablet (75 mg total) by mouth daily.   diclofenac sodium (VOLTAREN) 1 % GEL Apply 1 application topically 3 (three) times daily. Apply to feet   losartan-hydrochlorothiazide (HYZAAR) 100-25 MG tablet Take 1 tablet by mouth daily.   Oxycodone HCl 10 MG TABS Take 10 mg by mouth 3 (three) times daily.   polycarbophil (FIBERCON) 625 MG tablet Take 1,250 mg by mouth daily.   sertraline (ZOLOFT) 25 MG tablet Take 75 mg by mouth daily.   silodosin (RAPAFLO) 8 MG  CAPS capsule Take 1 capsule (8 mg total) by mouth daily with breakfast.   zolpidem (AMBIEN) 10 MG tablet Take 10 mg by mouth at bedtime.    Allergies:   Patient has no known allergies.   Social History   Socioeconomic History   Marital status: Married    Spouse name: Not on file   Number of children: Not on file   Years of education: Not on file   Highest education level: Not on file  Occupational History   Not on file  Tobacco Use   Smoking status: Never   Smokeless tobacco: Never  Vaping Use   Vaping Use: Never used  Substance and Sexual Activity   Alcohol use: Not Currently    Alcohol/week: 2.0 standard drinks    Types: 2 Glasses of wine per week   Drug use: No   Sexual activity: Yes  Other Topics Concern   Not on file  Social History Narrative   Not on file   Social Determinants of Health   Financial Resource Strain: Not on file  Food Insecurity: Not on file  Transportation  Needs: Not on file  Physical Activity: Not on file  Stress: Not on file  Social Connections: Not on file     Family History:  The patient's family history includes Coronary artery disease (age of onset: 40) in his father; Coronary artery disease (age of onset: 22) in his brother; Kidney cancer in his brother; Lupus in his daughter; Non-Hodgkin's lymphoma in his mother.  ROS:   Review of Systems  Constitutional:  Positive for malaise/fatigue. Negative for chills, diaphoresis, fever and weight loss.  HENT:  Negative for congestion.   Eyes:  Negative for discharge and redness.  Respiratory:  Negative for cough, sputum production, shortness of breath and wheezing.   Cardiovascular:  Negative for chest pain, palpitations, orthopnea, claudication, leg swelling and PND.  Gastrointestinal:  Negative for abdominal pain, blood in stool, heartburn, melena, nausea and vomiting.  Musculoskeletal:  Negative for falls and myalgias.  Skin:  Negative for rash.  Neurological:  Negative for dizziness, tingling, tremors, sensory change, speech change, focal weakness, loss of consciousness and weakness.  Endo/Heme/Allergies:  Does not bruise/bleed easily.  Psychiatric/Behavioral:  Negative for substance abuse. The patient is not nervous/anxious.   All other systems reviewed and are negative.   EKGs/Labs/Other Studies Reviewed:    Studies reviewed were summarized above. The additional studies were reviewed today:  LHC 02/12/2021:   Prox RCA to Mid RCA lesion is 100% stenosed.   Ost RCA to Prox RCA lesion is 50% stenosed.   Mid Cx lesion is 10% stenosed.   Mid Cx to Dist Cx lesion is 50% stenosed.   3rd Mrg lesion is 40% stenosed.   1st Mrg lesion is 90% stenosed.   Prox LAD to Mid LAD lesion is 40% stenosed.   Prox Cx to Mid Cx lesion is 70% stenosed.   Previously placed Mid LAD stent (unknown type) is  widely patent.   A drug-eluting stent was successfully placed using a STENT ONYX FRONTIER  3.0X18.   Post intervention, there is a 0% residual stenosis.   The left ventricular systolic function is normal.   LV end diastolic pressure is normal.   The left ventricular ejection fraction is 55-65% by visual estimate.   1.  Significant underlying three-vessel coronary artery disease with patent stents in the left circumflex and LAD.  Chronically occluded right coronary artery with left-to-right collaterals.  There is 70% stenosis  in the proximal/mid left circumflex at the proximal edge of the previously placed stent.  This was significant by fractional flow reserve evaluation with an IFR ratio of 0.85.  There is also moderate disease in the left circumflex distal to the old stent.  This was not significant by fractional flow reserve evaluation after treating the more proximal lesion.  OM1 is small in diameter with severe diffuse disease.  2.  Normal LV systolic function and high normal left ventricular end-diastolic pressure. 3.  Successful drug-eluting stent placement to the proximal/mid left circumflex overlapping the previously placed stent.   Recommendations: Continue dual antiplatelet therapy indefinitely if tolerated given multiple stents. Aggressive treatment of risk factors. __________  2D echo 05/05/2020: 1. Left ventricular ejection fraction, by estimation, is 50 to 55%. The  left ventricle has low normal function. Parasternal short axis images  suggestive of basal inferior wall hypokineis. There is mild left  ventricular hypertrophy. Left ventricular  diastolic parameters are consistent with Grade I diastolic dysfunction  (impaired relaxation).   2. Right ventricular systolic function is normal. The right ventricular  size is normal.   3. Left atrial size was moderately dilated.   4. The aortic valve is normal in structure.Trileaflet. Aortic valve  regurgitation is not visualized. No aortic stenosis is present.   5. Challenging images.     EKG:  EKG is ordered today.   The EKG ordered today demonstrates NSR, 74 bpm, rare PVCs, no acute ST-T changes  Recent Labs: 01/29/2021: TSH 4.910 02/13/2021: BUN 14; Creatinine, Ser 0.93; Hemoglobin 14.1; Platelets 125; Potassium 2.9; Sodium 138  Recent Lipid Panel    Component Value Date/Time   CHOL 139 11/18/2013 0246   TRIG 190 (H) 11/18/2013 0246   HDL 38 (L) 11/18/2013 0246   CHOLHDL 3.7 11/18/2013 0246   VLDL 38 11/18/2013 0246   LDLCALC 63 11/18/2013 0246    PHYSICAL EXAM:    VS:  BP (!) 142/82 (BP Location: Left Arm, Patient Position: Sitting, Cuff Size: Normal)   Pulse 74   Ht  (1.676 m)   Wt 193 lb (87.5 kg)   SpO2 96%   BMI 31.15 kg/m   BMI: Body mass index is 31.15 kg/m.  Physical Exam Constitutional:      Appearance: He is well-developed.  HENT:     Head: Normocephalic and atraumatic.  Eyes:     General:        Right eye: No discharge.        Left eye: No discharge.  Neck:     Vascular: No JVD.  Cardiovascular:     Rate and Rhythm: Normal rate and regular rhythm.     Pulses:          Posterior tibial pulses are 2+ on the right side and 2+ on the left side.     Heart sounds: Normal heart sounds, S1 normal and S2 normal. Heart sounds not distant. No midsystolic click and no opening snap. No murmur heard.   No friction rub.     Comments: Right radial arteriotomy site without active bleeding, swelling, warmth, erythema, or tenderness to palpation.  Radial pulse 2+ proximal and distal to the arteriotomy site.   Pulmonary:     Effort: Pulmonary effort is normal. No respiratory distress.     Breath sounds: Normal breath sounds. No decreased breath sounds, wheezing or rales.  Chest:     Chest wall: No tenderness.  Abdominal:     General: There is no distension.  Palpations: Abdomen is soft.     Tenderness: There is no abdominal tenderness.  Musculoskeletal:     Cervical back: Normal range of motion.     Right lower leg: No edema.     Left lower leg: No edema.  Skin:     General: Skin is warm and dry.     Nails: There is no clubbing.  Neurological:     Mental Status: He is alert and oriented to person, place, and time.  Psychiatric:        Speech: Speech normal.        Behavior: Behavior normal.        Thought Content: Thought content normal.        Judgment: Judgment normal.    Wt Readings from Last 3 Encounters:  02/26/21 193 lb (87.5 kg)  02/12/21 191 lb 9.6 oz (86.9 kg)  01/29/21 193 lb (87.5 kg)     ASSESSMENT & PLAN:   CAD involving the native coronary arteries without angina: He is doing well without any symptoms concerning for angina.  Continue DAPT with ASA and clopidogrel, ideally indefinitely given multiple stents, along with atorvastatin.  Post-cath instructions.  Aggressive risk factor modification.  No indication for further ischemic testing at this time.  Check CBC given DAPT use.  History of HFrEF secondary to ICM: He appears euvolemic and well compensated.  EF low normal earlier this year.  Not currently on beta-blocker secondary to heart rates in the upper 50s to low 60s bpm at baseline.  HTN: Blood pressure is elevated in the 140s over 80s with readings over the past year typically in the 140s to 150s systolic.  He remains adherent to amlodipine 10 mg and losartan/HCTZ 100/25 mg daily.  Schedule renal artery ultrasound given persistently elevated BP on 3 different antihypertensives, including thiazide diuretic.  He has also been referred to sleep medicine.  HLD: LDL 77 in 02/2020 with goal LDL being less than 70.  Currently on atorvastatin 80 mg.  Check CMP, lipid panel, and direct LDL.  Recommend escalation of lipid therapy as indicated to achieve target LDL.  PVCs: Asymptomatic.  He remains off beta-blocker given resting heart rate is typically in the 50s to 60s bpm.  Hypokalemia: Received repletion prior to hospital discharge.  Check CMP.  OSA: He reports he needs a new CPAP.  He has been referred to sleep  medicine.   Disposition: F/u with Dr. Kirke Corin or an APP in 2 months.   Medication Adjustments/Labs and Tests Ordered: Current medicines are reviewed at length with the patient today.  Concerns regarding medicines are outlined above. Medication changes, Labs and Tests ordered today are summarized above and listed in the Patient Instructions accessible in Encounters.   Signed, Eula Listen, PA-C 02/26/2021 10:09 AM     [facility] [address] [place], [place] [address] [phone]

## 2021-02-26 NOTE — Patient Instructions (Signed)
Medication Instructions:  No changes at this time.  *If you need a refill on your cardiac medications before your next appointment, please call your pharmacy*   Lab Work: CMET, Lipid, direct ldl, and CBC today  If you have labs (blood work) drawn today and your tests are completely normal, you will receive your results only by: MyChart Message (if you have MyChart) OR A paper copy in the mail If you have any lab test that is abnormal or we need to change your treatment, we will call you to review the results.   Testing/Procedures: Your physician has requested that you have a renal artery duplex.   During this test, an ultrasound is used to evaluate blood flow to the kidneys. Allow one hour for this exam. Do not eat after midnight the day before and avoid carbonated beverages. Take your medications as you usually do.    Follow-Up: At [facility] you and your health needs are our priority.  As part of our continuing mission to provide you with exceptional heart care, we have created designated Provider Care Teams.  These Care Teams include your primary Cardiologist (physician) and Advanced Practice Providers (APPs -  Physician Assistants and Nurse Practitioners) who all work together to provide you with the care you need, when you need it.   Your next appointment:   2 month(s)  The format for your next appointment:   In Person  Provider:   Lorine Bears, MD or Eula Listen, PA-C    Other Instructions Referral placed for the sleep disorder clinic with Dr. Mayford Knife at the [facility] location.

## 2021-02-27 ENCOUNTER — Telehealth: Payer: Self-pay | Admitting: *Deleted

## 2021-02-27 DIAGNOSIS — E785 Hyperlipidemia, unspecified: Secondary | ICD-10-CM

## 2021-02-27 DIAGNOSIS — I255 Ischemic cardiomyopathy: Secondary | ICD-10-CM

## 2021-02-27 DIAGNOSIS — I2510 Atherosclerotic heart disease of native coronary artery without angina pectoris: Secondary | ICD-10-CM

## 2021-02-27 LAB — COMPREHENSIVE METABOLIC PANEL
ALT: 24 IU/L (ref 0–44)
AST: 24 IU/L (ref 0–40)
Albumin/Globulin Ratio: 1.5 (ref 1.2–2.2)
Albumin: 4.4 g/dL (ref 3.8–4.8)
Alkaline Phosphatase: 123 IU/L — ABNORMAL HIGH (ref 44–121)
BUN/Creatinine Ratio: 8 — ABNORMAL LOW (ref 10–24)
BUN: 9 mg/dL (ref 8–27)
Bilirubin Total: 0.8 mg/dL (ref 0.0–1.2)
CO2: 31 mmol/L — ABNORMAL HIGH (ref 20–29)
Calcium: 9.2 mg/dL (ref 8.6–10.2)
Chloride: 101 mmol/L (ref 96–106)
Creatinine, Ser: 1.06 mg/dL (ref 0.76–1.27)
Globulin, Total: 3 g/dL (ref 1.5–4.5)
Glucose: 94 mg/dL (ref 70–99)
Potassium: 3.9 mmol/L (ref 3.5–5.2)
Sodium: 144 mmol/L (ref 134–144)
Total Protein: 7.4 g/dL (ref 6.0–8.5)
eGFR: 75 mL/min/{1.73_m2} (ref >59)

## 2021-02-27 LAB — LIPID PANEL
Chol/HDL Ratio: 4.4 ratio (ref 0.0–5.0)
Cholesterol, Total: 141 mg/dL (ref 100–199)
HDL: 32 mg/dL — ABNORMAL LOW (ref >39)
LDL Chol Calc (NIH): 81 mg/dL (ref 0–99)
Triglycerides: 163 mg/dL — ABNORMAL HIGH (ref 0–149)
VLDL Cholesterol Cal: 28 mg/dL (ref 5–40)

## 2021-02-27 LAB — CBC
Hematocrit: 45.9 % (ref 37.5–51.0)
Hemoglobin: 15.9 g/dL (ref 13.0–17.7)
MCH: 30.6 pg (ref 26.6–33.0)
MCHC: 34.6 g/dL (ref 31.5–35.7)
MCV: 88 fL (ref 79–97)
Platelets: 136 10*3/uL — ABNORMAL LOW (ref 150–450)
RBC: 5.2 x10E6/uL (ref 4.14–5.80)
RDW: 13.1 % (ref 11.6–15.4)
WBC: 10.6 10*3/uL (ref 3.4–10.8)

## 2021-02-27 LAB — LDL CHOLESTEROL, DIRECT: LDL Direct: 82 mg/dL (ref 0–99)

## 2021-02-27 NOTE — Telephone Encounter (Signed)
Left voicemail message to call back for review of results.  

## 2021-02-27 NOTE — Telephone Encounter (Signed)
-----   Message from Sondra Barges, PA-C sent at 02/27/2021  7:11 AM EST ----- Renal function normal Potassium improved and now normal One nonspecific liver function test is mildly elevated LDL or "bad cholesterol" is mildly elevated and above goal Blood count normal with stable mildly low platelet count  Recommendations: Add Zetia 10 mg daily with continuation of atorvastatin 80 mg daily Recheck fasting lipid panel and liver function in 2 months

## 2021-02-28 NOTE — Telephone Encounter (Signed)
Attempted to call the patient. No answer- I left a message to please call back to review provider recommendations.

## 2021-03-05 ENCOUNTER — Telehealth: Payer: Self-pay | Admitting: Physician Assistant

## 2021-03-05 NOTE — Telephone Encounter (Signed)
Patient was returning a call to discuss lab results. He saw the results in the portal.  He also wanted to let Alycia Rossetti know that he is to start cardiac rehab tomorrow and the rehab team did a Nutrition Workup

## 2021-03-05 NOTE — Telephone Encounter (Signed)
Left voicemail message to call back for review of results and recommendations.  

## 2021-03-06 NOTE — Telephone Encounter (Signed)
Duplicate . See other telephone encounter.

## 2021-03-06 NOTE — Telephone Encounter (Signed)
Left voicemail message to call back for review of results and recommendations.  

## 2021-03-07 ENCOUNTER — Other Ambulatory Visit: Payer: Self-pay

## 2021-03-07 ENCOUNTER — Encounter: Payer: Medicare Other | Admitting: *Deleted

## 2021-03-07 VITALS — Ht 66.5 in | Wt 194.6 lb

## 2021-03-07 DIAGNOSIS — Z955 Presence of coronary angioplasty implant and graft: Secondary | ICD-10-CM | POA: Diagnosis not present

## 2021-03-07 NOTE — Progress Notes (Signed)
Cardiac Individual Treatment Plan  Patient Details  Name: Terry Cook MRN: [id number] Date of Birth: Sep 08, 1950 Referring Provider:   Flowsheet Row Cardiac Rehab from 03/07/2021 in [facility] Cardiac and Pulmonary Rehab  Referring Provider Lorine Bears MD       Initial Encounter Date:  Flowsheet Row Cardiac Rehab from 03/07/2021 in [facility] Cardiac and Pulmonary Rehab  Date 03/07/21       Visit Diagnosis: Status post coronary artery stent placement  Patient's Home Medications on Admission:  Current Outpatient Medications:    acetaminophen (TYLENOL) 500 MG tablet, Take 1,000 mg by mouth every 8 (eight) hours as needed for mild pain or moderate pain., Disp: , Rfl:    ALPRAZolam (XANAX) 0.5 MG tablet, Take 0.5 mg by mouth 2 (two) times daily as needed for anxiety., Disp: , Rfl:    amLODipine (NORVASC) 10 MG tablet, Take 1 tablet (10 mg total) by mouth daily., Disp: 90 tablet, Rfl: 1   aspirin EC 81 MG tablet, Take 81 mg by mouth daily., Disp: , Rfl:    atorvastatin (LIPITOR) 80 MG tablet, Take 1 tablet (80 mg total) by mouth every evening., Disp: 90 tablet, Rfl: 1   clopidogrel (PLAVIX) 75 MG tablet, Take 1 tablet (75 mg total) by mouth daily., Disp: 90 tablet, Rfl: 0   diclofenac sodium (VOLTAREN) 1 % GEL, Apply 1 application topically 3 (three) times daily. Apply to feet, Disp: , Rfl:    losartan-hydrochlorothiazide (HYZAAR) 100-25 MG tablet, Take 1 tablet by mouth daily., Disp: , Rfl:    Oxycodone HCl 10 MG TABS, Take 10 mg by mouth 3 (three) times daily., Disp: , Rfl:    polycarbophil (FIBERCON) 625 MG tablet, Take 1,250 mg by mouth daily., Disp: , Rfl:    sertraline (ZOLOFT) 25 MG tablet, Take 75 mg by mouth daily., Disp: , Rfl:    silodosin (RAPAFLO) 8 MG CAPS capsule, Take 1 capsule (8 mg total) by mouth daily with breakfast., Disp: 90 capsule, Rfl: 3   zolpidem (AMBIEN) 10 MG tablet, Take 10 mg by mouth at bedtime., Disp: , Rfl:   Past Medical History: Past Medical History:   Diagnosis Date   Anxiety    CAD (coronary artery disease), native coronary artery 06/04/2011   s/p MI with PCI to left circ   Chronic lower back pain    DDD (degenerative disc disease), lumbar    Depression    High cholesterol    History of kidney stones    Hypertension    Irregular heart beats    MI (myocardial infarction) (HCC) 05/2011   OSA on CPAP     Tobacco Use: Social History   Tobacco Use  Smoking Status Never  Smokeless Tobacco Never    Labs: Recent Review Flowsheet Data     Labs for ITP Cardiac and Pulmonary Rehab Latest Ref Rng & Units 06/03/2011 06/04/2011 11/18/2013 02/26/2021   Cholestrol 100 - 199 mg/dL - 409 811 914   LDLCALC 0 - 99 mg/dL - 69 63 81   LDLDIRECT 0 - 99 mg/dL - - - 82   HDL >78 mg/dL - 29(F) 62(Z) 30(Q)   Trlycerides 0 - 149 mg/dL - 657 846(N) 629(B)   TCO2 0 - 100 mmol/L 32 - - -        Exercise Target Goals: Exercise Program Goal: Individual exercise prescription set using results from initial 6 min walk test and THRR while considering  patient's activity barriers and safety.   Exercise Prescription Goal: Initial exercise  prescription builds to 30-45 minutes a day of aerobic activity, 2-3 days per week.  Home exercise guidelines will be given to patient during program as part of exercise prescription that the participant will acknowledge.   Education: Aerobic Exercise: - Group verbal and visual presentation on the components of exercise prescription. Introduces F.I.T.T principle from ACSM for exercise prescriptions.  Reviews F.I.T.T. principles of aerobic exercise including progression. Written material given at graduation.   Education: Resistance Exercise: - Group verbal and visual presentation on the components of exercise prescription. Introduces F.I.T.T principle from ACSM for exercise prescriptions  Reviews F.I.T.T. principles of resistance exercise including progression. Written material given at graduation.    Education:  Exercise & Equipment Safety: - Individual verbal instruction and demonstration of equipment use and safety with use of the equipment. Flowsheet Row Cardiac Rehab from 03/07/2021 in [facility] Cardiac and Pulmonary Rehab  Date 03/07/21  Educator [facility]  Instruction Review Code 1- Verbalizes Understanding       Education: Exercise Physiology & General Exercise Guidelines: - Group verbal and written instruction with models to review the exercise physiology of the cardiovascular system and associated critical values. Provides general exercise guidelines with specific guidelines to those with heart or lung disease.    Education: Flexibility, Balance, Mind/Body Relaxation: - Group verbal and visual presentation with interactive activity on the components of exercise prescription. Introduces F.I.T.T principle from ACSM for exercise prescriptions. Reviews F.I.T.T. principles of flexibility and balance exercise training including progression. Also discusses the mind body connection.  Reviews various relaxation techniques to help reduce and manage stress (i.e. Deep breathing, progressive muscle relaxation, and visualization). Balance handout provided to take home. Written material given at graduation.   Activity Barriers & Risk Stratification:  Activity Barriers & Cardiac Risk Stratification - 03/07/21 1317       Activity Barriers & Cardiac Risk Stratification   Activity Barriers Arthritis;Back Problems;Neck/Spine Problems;Deconditioning;Muscular Weakness;Shortness of Breath;Balance Concerns   cervical stenosis, chronic low back pain with DDD   Cardiac Risk Stratification Moderate             6 Minute Walk:  6 Minute Walk     Row Name 03/07/21 1316         6 Minute Walk   Phase Initial     Distance 1360 feet     Walk Time 6 minutes     # of Rest Breaks 0     MPH 2.58     METS 2.88     RPE 8     VO2 Peak 10.07     Symptoms Yes (comment)     Comments tail bone/back pain 1/10, burning in  thighs, SOB     Resting HR 65 bpm     Resting BP 144/86     Resting Oxygen Saturation  96 %     Exercise Oxygen Saturation  during 6 min walk 96 %     Max Ex. HR 90 bpm     Max Ex. BP 158/86              Oxygen Initial Assessment:   Oxygen Re-Evaluation:   Oxygen Discharge (Final Oxygen Re-Evaluation):   Initial Exercise Prescription:  Initial Exercise Prescription - 03/07/21 1300       Date of Initial Exercise RX and Referring Provider   Date 03/07/21    Referring Provider Lorine Bears MD      Oxygen   Maintain Oxygen Saturation 88% or higher      Treadmill  MPH 2.3    Grade 0.5    Minutes 15    METs 2.76      Recumbant Bike   Level 2    RPM 50    Watts 24    Minutes 15    METs 2.8      REL-XR   Level 2    Speed 50    Minutes 15    METs 2.8      Track   Laps 35    Minutes 15    METs 2.9      Prescription Details   Frequency (times per week) 3    Duration Progress to 30 minutes of continuous aerobic without signs/symptoms of physical distress      Intensity   THRR 40-80% of Max Heartrate 99-133    Ratings of Perceived Exertion 11-13    Perceived Dyspnea 0-4      Progression   Progression Continue to progress workloads to maintain intensity without signs/symptoms of physical distress.      Resistance Training   Training Prescription Yes    Weight 4  lb    Reps 10-15             Perform Capillary Blood Glucose checks as needed.  Exercise Prescription Changes:   Exercise Prescription Changes     Row Name 03/07/21 1300             Response to Exercise   Blood Pressure (Admit) 144/86       Blood Pressure (Exercise) 158/86       Blood Pressure (Exit) 124/58       Heart Rate (Admit) 65 bpm       Heart Rate (Exercise) 90 bpm       Heart Rate (Exit) 70 bpm       Oxygen Saturation (Admit) 96 %       Oxygen Saturation (Exercise) 96 %       Rating of Perceived Exertion (Exercise) 8       Symptoms tail bone/back pain 1/10,  thighs burning, SOB       Comments walk test results                Exercise Comments:   Exercise Goals and Review:   Exercise Goals     Row Name 03/07/21 1338             Exercise Goals   Increase Physical Activity Yes       Intervention Provide advice, education, support and counseling about physical activity/exercise needs.;Develop an individualized exercise prescription for aerobic and resistive training based on initial evaluation findings, risk stratification, comorbidities and participant's personal goals.       Expected Outcomes Short Term: Attend rehab on a regular basis to increase amount of physical activity.;Long Term: Add in home exercise to make exercise part of routine and to increase amount of physical activity.;Long Term: Exercising regularly at least 3-5 days a week.       Increase Strength and Stamina Yes       Intervention Provide advice, education, support and counseling about physical activity/exercise needs.;Develop an individualized exercise prescription for aerobic and resistive training based on initial evaluation findings, risk stratification, comorbidities and participant's personal goals.       Expected Outcomes Short Term: Increase workloads from initial exercise prescription for resistance, speed, and METs.;Long Term: Improve cardiorespiratory fitness, muscular endurance and strength as measured by increased METs and functional capacity ( );Short Term: Perform resistance training exercises routinely  during rehab and add in resistance training at home       Able to understand and use rate of perceived exertion (RPE) scale Yes       Intervention Provide education and explanation on how to use RPE scale       Expected Outcomes Short Term: Able to use RPE daily in rehab to express subjective intensity level;Long Term:  Able to use RPE to guide intensity level when exercising independently       Able to understand and use Dyspnea scale Yes        Intervention Provide education and explanation on how to use Dyspnea scale       Expected Outcomes Short Term: Able to use Dyspnea scale daily in rehab to express subjective sense of shortness of breath during exertion;Long Term: Able to use Dyspnea scale to guide intensity level when exercising independently       Knowledge and understanding of Target Heart Rate Range (THRR) Yes       Intervention Provide education and explanation of THRR including how the numbers were predicted and where they are located for reference       Expected Outcomes Short Term: Able to state/look up THRR;Long Term: Able to use THRR to govern intensity when exercising independently;Short Term: Able to use daily as guideline for intensity in rehab       Able to check pulse independently Yes       Intervention Provide education and demonstration on how to check pulse in carotid and radial arteries.;Review the importance of being able to check your own pulse for safety during independent exercise       Expected Outcomes Short Term: Able to explain why pulse checking is important during independent exercise;Long Term: Able to check pulse independently and accurately       Understanding of Exercise Prescription Yes       Intervention Provide education, explanation, and written materials on patient's individual exercise prescription       Expected Outcomes Short Term: Able to explain program exercise prescription;Long Term: Able to explain home exercise prescription to exercise independently                Exercise Goals Re-Evaluation :   Discharge Exercise Prescription (Final Exercise Prescription Changes):  Exercise Prescription Changes - 03/07/21 1300       Response to Exercise   Blood Pressure (Admit) 144/86    Blood Pressure (Exercise) 158/86    Blood Pressure (Exit) 124/58    Heart Rate (Admit) 65 bpm    Heart Rate (Exercise) 90 bpm    Heart Rate (Exit) 70 bpm    Oxygen Saturation (Admit) 96 %    Oxygen  Saturation (Exercise) 96 %    Rating of Perceived Exertion (Exercise) 8    Symptoms tail bone/back pain 1/10, thighs burning, SOB    Comments walk test results             Nutrition:  Target Goals: Understanding of nutrition guidelines, daily intake of sodium 1500mg , cholesterol 200mg , calories 30% from fat and 7% or less from saturated fats, daily to have 5 or more servings of fruits and vegetables.  Education: All About Nutrition: -Group instruction provided by verbal, written material, interactive activities, discussions, models, and posters to present general guidelines for heart healthy nutrition including fat, fiber, MyPlate, the role of sodium in heart healthy nutrition, utilization of the nutrition label, and utilization of this knowledge for meal planning. Follow up email sent as  well. Written material given at graduation. Flowsheet Row Cardiac Rehab from 03/07/2021 in [facility] Cardiac and Pulmonary Rehab  Education need identified 03/07/21       Biometrics:  Pre Biometrics - 03/07/21 1339       Pre Biometrics   Height 5' 6.5" (1.689 m)    Weight 194 lb 9.6 oz (88.3 kg)    BMI (Calculated) 30.94    Single Leg Stand 4.8 seconds              Nutrition Therapy Plan and Nutrition Goals:  Nutrition Therapy & Goals - 03/07/21 1339       Intervention Plan   Intervention Prescribe, educate and counsel regarding individualized specific dietary modifications aiming towards targeted core components such as weight, hypertension, lipid management, diabetes, heart failure and other comorbidities.    Expected Outcomes Short Term Goal: Understand basic principles of dietary content, such as calories, fat, sodium, cholesterol and nutrients.;Short Term Goal: A plan has been developed with personal nutrition goals set during dietitian appointment.;Long Term Goal: Adherence to prescribed nutrition plan.             Nutrition Assessments:  MEDIFICTS Score Key: ?70 Need to  make dietary changes  40-70 Heart Healthy Diet ? 40 Therapeutic Level Cholesterol Diet  Flowsheet Row Cardiac Rehab from 03/07/2021 in [facility] Cardiac and Pulmonary Rehab  Picture Your Plate Total Score on Admission 69      Picture Your Plate Scores: <19 Unhealthy dietary pattern with much room for improvement. 41-50 Dietary pattern unlikely to meet recommendations for good health and room for improvement. 51-60 More healthful dietary pattern, with some room for improvement.  >60 Healthy dietary pattern, although there may be some specific behaviors that could be improved.    Nutrition Goals Re-Evaluation:   Nutrition Goals Discharge (Final Nutrition Goals Re-Evaluation):   Psychosocial: Target Goals: Acknowledge presence or absence of significant depression and/or stress, maximize coping skills, provide positive support system. Participant is able to verbalize types and ability to use techniques and skills needed for reducing stress and depression.   Education: Stress, Anxiety, and Depression - Group verbal and visual presentation to define topics covered.  Reviews how body is impacted by stress, anxiety, and depression.  Also discusses healthy ways to reduce stress and to treat/manage anxiety and depression.  Written material given at graduation.   Education: Sleep Hygiene -Provides group verbal and written instruction about how sleep can affect your health.  Define sleep hygiene, discuss sleep cycles and impact of sleep habits. Review good sleep hygiene tips.    Initial Review & Psychosocial Screening:  Initial Psych Review & Screening - 02/16/21 1010       Initial Review   Current issues with Current Psychotropic Meds;Current Stress Concerns    Source of Stress Concerns Financial    Comments Retired      [employer]   Good Support System? Yes   Wife , son and his wife  very close     Barriers   Psychosocial barriers to participate in program There are no  identifiable barriers or psychosocial needs.;The patient should benefit from training in stress management and relaxation.      Screening Interventions   Interventions Encouraged to exercise;To provide support and resources with identified psychosocial needs;Provide feedback about the scores to participant    Expected Outcomes Short Term goal: Utilizing psychosocial counselor, staff and physician to assist with identification of specific Stressors or current issues interfering with healing process. Setting desired goal for  each stressor or current issue identified.;Long Term Goal: Stressors or current issues are controlled or eliminated.;Short Term goal: Identification and review with participant of any Quality of Life or Depression concerns found by scoring the questionnaire.;Long Term goal: The participant improves quality of Life and PHQ9 Scores as seen by post scores and/or verbalization of changes             Quality of Life Scores:   Quality of Life - 03/07/21 1339       Quality of Life   Select Quality of Life      Quality of Life Scores   Health/Function Pre 14.07 %    Socioeconomic Pre 13.56 %    Psych/Spiritual Pre 12.43 %    Family Pre 18.7 %    GLOBAL Pre 14.29 %            Scores of 19 and below usually indicate a poorer quality of life in these areas.  A difference of  2-3 points is a clinically meaningful difference.  A difference of 2-3 points in the total score of the Quality of Life Index has been associated with significant improvement in overall quality of life, self-image, physical symptoms, and general health in studies assessing change in quality of life.  PHQ-9: Recent Review Flowsheet Data     Depression screen [facility] 2/9 03/07/2021 03/14/2014 12/15/2013   Decreased Interest 1 0 0   Down, Depressed, Hopeless 1 1 0   PHQ - 2 Score 2 1 0   Altered sleeping 2 - -   Tired, decreased energy 2 - -   Change in appetite 0 - -   Feeling bad or failure about  yourself  1 - -   Trouble concentrating 0 - -   Moving slowly or fidgety/restless 0 - -   Suicidal thoughts 0 - -   PHQ-9 Score 7 - -   Difficult doing work/chores Not difficult at all - -      Interpretation of Total Score  Total Score Depression Severity:  1-4 = Minimal depression, 5-9 = Mild depression, 10-14 = Moderate depression, 15-19 = Moderately severe depression, 20-27 = Severe depression   Psychosocial Evaluation and Intervention:  Psychosocial Evaluation - 02/16/21 1028       Psychosocial Evaluation & Interventions   Interventions Encouraged to exercise with the program and follow exercise prescription    Comments Al has no barriers to attending the program. He lives with his wife of 45 years. Their son and daughter in law live close by and they talk with or see them several times a week. He does work some and enjoys working as it keeps him moving. He has completed Cardiac Rehab in the past and is looking forward to doing as much as he can to provide him guidance with his exercise. He has made several nutrition changes and is ready to review this with the RD. He does have some stress,financial,and some mild depression. He is on medication prescribed by his physician.  He does have some back problems and will let staff know if the exercise regimen increases his pain.  He shoudl do well with the program.    Expected Outcomes STG: Attending all scheduled sessions. Keeping stress and depression controlled. Learning and managing exercise/nutriton goals  LTG: Giann will be able to continue progress he gained in the program    Continue Psychosocial Services  Follow up required by staff  Psychosocial Re-Evaluation:   Psychosocial Discharge (Final Psychosocial Re-Evaluation):   Vocational Rehabilitation: Provide vocational rehab assistance to qualifying candidates.   Vocational Rehab Evaluation & Intervention:  Vocational Rehab - 02/16/21 1016       Initial  Vocational Rehab Evaluation & Intervention   Assessment shows need for Vocational Rehabilitation No      Discharge Vocational Rehab   Discharge Vocational Rehabilitation retired             Education: Education Goals: Education classes will be provided on a variety of topics geared toward better understanding of heart health and risk factor modification. Participant will state understanding/return demonstration of topics presented as noted by education test scores.  Learning Barriers/Preferences:   General Cardiac Education Topics:  AED/CPR: - Group verbal and written instruction with the use of models to demonstrate the basic use of the AED with the basic ABC's of resuscitation.   Anatomy and Cardiac Procedures: - Group verbal and visual presentation and models provide information about basic cardiac anatomy and function. Reviews the testing methods done to diagnose heart disease and the outcomes of the test results. Describes the treatment choices: Medical Management, Angioplasty, or Coronary Bypass Surgery for treating various heart conditions including Myocardial Infarction, Angina, Valve Disease, and Cardiac Arrhythmias.  Written material given at graduation.   Medication Safety: - Group verbal and visual instruction to review commonly prescribed medications for heart and lung disease. Reviews the medication, class of the drug, and side effects. Includes the steps to properly store meds and maintain the prescription regimen.  Written material given at graduation.   Intimacy: - Group verbal instruction through game format to discuss how heart and lung disease can affect sexual intimacy. Written material given at graduation..   Know Your Numbers and Heart Failure: - Group verbal and visual instruction to discuss disease risk factors for cardiac and pulmonary disease and treatment options.  Reviews associated critical values for Overweight/Obesity, Hypertension, Cholesterol,  and Diabetes.  Discusses basics of heart failure: signs/symptoms and treatments.  Introduces Heart Failure Zone chart for action plan for heart failure.  Written material given at graduation.   Infection Prevention: - Provides verbal and written material to individual with discussion of infection control including proper hand washing and proper equipment cleaning during exercise session. Flowsheet Row Cardiac Rehab from 03/07/2021 in [facility] Cardiac and Pulmonary Rehab  Date 03/07/21  Educator [facility]  Instruction Review Code 1- Verbalizes Understanding       Falls Prevention: - Provides verbal and written material to individual with discussion of falls prevention and safety. Flowsheet Row Cardiac Rehab from 03/07/2021 in [facility] Cardiac and Pulmonary Rehab  Date 02/16/21  Educator SB  Instruction Review Code 1- Verbalizes Understanding       Other: -Provides group and verbal instruction on various topics (see comments)   Knowledge Questionnaire Score:  Knowledge Questionnaire Score - 03/07/21 1340       Knowledge Questionnaire Score   Pre Score 25/26             Core Components/Risk Factors/Patient Goals at Admission:  Personal Goals and Risk Factors at Admission - 03/07/21 1340       Core Components/Risk Factors/Patient Goals on Admission    Weight Management Yes;Obesity;Weight Loss    Intervention Weight Management: Develop a combined nutrition and exercise program designed to reach desired caloric intake, while maintaining appropriate intake of nutrient and fiber, sodium and fats, and appropriate energy expenditure required for the weight goal.;Obesity: Provide education and appropriate resources  to help participant work on and attain dietary goals.;Weight Management: Provide education and appropriate resources to help participant work on and attain dietary goals.;Weight Management/Obesity: Establish reasonable short term and long term weight goals.    Admit Weight 194 lb 9.6  oz (88.3 kg)    Goal Weight: Short Term 190 lb (86.2 kg)    Goal Weight: Long Term 180 lb (81.6 kg)    Expected Outcomes Short Term: Continue to assess and modify interventions until short term weight is achieved;Long Term: Adherence to nutrition and physical activity/exercise program aimed toward attainment of established weight goal;Weight Loss: Understanding of general recommendations for a balanced deficit meal plan, which promotes 1-2 lb weight loss per week and includes a negative energy balance of [phone] kcal/d;Understanding recommendations for meals to include 15-35% energy as protein, 25-35% energy from fat, 35-60% energy from carbohydrates, less than 200mg  of dietary cholesterol, 20-35 gm of total fiber daily;Understanding of distribution of calorie intake throughout the day with the consumption of 4-5 meals/snacks    Hypertension Yes    Intervention Provide education on lifestyle modifcations including regular physical activity/exercise, weight management, moderate sodium restriction and increased consumption of fresh fruit, vegetables, and low fat dairy, alcohol moderation, and smoking cessation.;Monitor prescription use compliance.    Expected Outcomes Short Term: Continued assessment and intervention until BP is < 140/5mm HG in hypertensive participants. < 130/30mm HG in hypertensive participants with diabetes, heart failure or chronic kidney disease.;Long Term: Maintenance of blood pressure at goal levels.    Lipids Yes    Intervention Provide education and support for participant on nutrition & aerobic/resistive exercise along with prescribed medications to achieve LDL 70mg , HDL >40mg .    Expected Outcomes Short Term: Participant states understanding of desired cholesterol values and is compliant with medications prescribed. Participant is following exercise prescription and nutrition guidelines.;Long Term: Cholesterol controlled with medications as prescribed, with individualized  exercise RX and with personalized nutrition plan. Value goals: LDL < 70mg , HDL > 40 mg.             Education:Diabetes - Individual verbal and written instruction to review signs/symptoms of diabetes, desired ranges of glucose level fasting, after meals and with exercise. Acknowledge that pre and post exercise glucose checks will be done for 3 sessions at entry of program.   Core Components/Risk Factors/Patient Goals Review:    Core Components/Risk Factors/Patient Goals at Discharge (Final Review):    ITP Comments:  ITP Comments     Row Name 02/16/21 1023 03/07/21 1315         ITP Comments Virtual orientation call completed today. he has an appointment on Date: 03/05/2021  for EP eval and gym Orientation.  Documentation of diagnosis can be found in [facility] Date: 02/12/21 . Completed [facility] and gym orientation. Initial ITP created and sent for review to Dr. 13/7/22, Medical Director.               Comments: Initial ITP

## 2021-03-07 NOTE — Patient Instructions (Signed)
Patient Instructions  Patient Details  Name: Terry Cook MRN: [id number] Date of Birth: Aug 30, 1950 Referring Provider:  Iran Ouch, MD  Below are your personal goals for exercise, nutrition, and risk factors. Our goal is to help you stay on track towards obtaining and maintaining these goals. We will be discussing your progress on these goals with you throughout the program.  Initial Exercise Prescription:  Initial Exercise Prescription - 03/07/21 1300       Date of Initial Exercise RX and Referring Provider   Date 03/07/21    Referring Provider Lorine Bears MD      Oxygen   Maintain Oxygen Saturation 88% or higher      Treadmill   MPH 2.3    Grade 0.5    Minutes 15    METs 2.76      Recumbant Bike   Level 2    RPM 50    Watts 24    Minutes 15    METs 2.8      REL-XR   Level 2    Speed 50    Minutes 15    METs 2.8      Track   Laps 35    Minutes 15    METs 2.9      Prescription Details   Frequency (times per week) 3    Duration Progress to 30 minutes of continuous aerobic without signs/symptoms of physical distress      Intensity   THRR 40-80% of Max Heartrate 99-133    Ratings of Perceived Exertion 11-13    Perceived Dyspnea 0-4      Progression   Progression Continue to progress workloads to maintain intensity without signs/symptoms of physical distress.      Resistance Training   Training Prescription Yes    Weight 4  lb    Reps 10-15             Exercise Goals: Frequency: Be able to perform aerobic exercise two to three times per week in program working toward 2-5 days per week of home exercise.  Intensity: Work with a perceived exertion of 11 (fairly light) - 15 (hard) while following your exercise prescription.  We will make changes to your prescription with you as you progress through the program.   Duration: Be able to do 30 to 45 minutes of continuous aerobic exercise in addition to a 5 minute warm-up and a 5 minute  cool-down routine.   Nutrition Goals: Your personal nutrition goals will be established when you do your nutrition analysis with the dietician.  The following are general nutrition guidelines to follow: Cholesterol < 200mg /day Sodium < 1500mg /day Fiber: Men over 50 yrs - 30 grams per day  Personal Goals:  Personal Goals and Risk Factors at Admission - 03/07/21 1340       Core Components/Risk Factors/Patient Goals on Admission    Weight Management Yes;Obesity;Weight Loss    Intervention Weight Management: Develop a combined nutrition and exercise program designed to reach desired caloric intake, while maintaining appropriate intake of nutrient and fiber, sodium and fats, and appropriate energy expenditure required for the weight goal.;Obesity: Provide education and appropriate resources to help participant work on and attain dietary goals.;Weight Management: Provide education and appropriate resources to help participant work on and attain dietary goals.;Weight Management/Obesity: Establish reasonable short term and long term weight goals.    Admit Weight 194 lb 9.6 oz (88.3 kg)    Goal Weight: Short Term 190 lb (86.2 kg)  Goal Weight: Long Term 180 lb (81.6 kg)    Expected Outcomes Short Term: Continue to assess and modify interventions until short term weight is achieved;Long Term: Adherence to nutrition and physical activity/exercise program aimed toward attainment of established weight goal;Weight Loss: Understanding of general recommendations for a balanced deficit meal plan, which promotes 1-2 lb weight loss per week and includes a negative energy balance of [phone] kcal/d;Understanding recommendations for meals to include 15-35% energy as protein, 25-35% energy from fat, 35-60% energy from carbohydrates, less than 200mg  of dietary cholesterol, 20-35 gm of total fiber daily;Understanding of distribution of calorie intake throughout the day with the consumption of 4-5 meals/snacks     Hypertension Yes    Intervention Provide education on lifestyle modifcations including regular physical activity/exercise, weight management, moderate sodium restriction and increased consumption of fresh fruit, vegetables, and low fat dairy, alcohol moderation, and smoking cessation.;Monitor prescription use compliance.    Expected Outcomes Short Term: Continued assessment and intervention until BP is < 140/73mm HG in hypertensive participants. < 130/37mm HG in hypertensive participants with diabetes, heart failure or chronic kidney disease.;Long Term: Maintenance of blood pressure at goal levels.    Lipids Yes    Intervention Provide education and support for participant on nutrition & aerobic/resistive exercise along with prescribed medications to achieve LDL 70mg , HDL >40mg .    Expected Outcomes Short Term: Participant states understanding of desired cholesterol values and is compliant with medications prescribed. Participant is following exercise prescription and nutrition guidelines.;Long Term: Cholesterol controlled with medications as prescribed, with individualized exercise RX and with personalized nutrition plan. Value goals: LDL < 70mg , HDL > 40 mg.             Tobacco Use Initial Evaluation: Social History   Tobacco Use  Smoking Status Never  Smokeless Tobacco Never    Exercise Goals and Review:  Exercise Goals     Row Name 03/07/21 1338             Exercise Goals   Increase Physical Activity Yes       Intervention Provide advice, education, support and counseling about physical activity/exercise needs.;Develop an individualized exercise prescription for aerobic and resistive training based on initial evaluation findings, risk stratification, comorbidities and participant's personal goals.       Expected Outcomes Short Term: Attend rehab on a regular basis to increase amount of physical activity.;Long Term: Add in home exercise to make exercise part of routine and to  increase amount of physical activity.;Long Term: Exercising regularly at least 3-5 days a week.       Increase Strength and Stamina Yes       Intervention Provide advice, education, support and counseling about physical activity/exercise needs.;Develop an individualized exercise prescription for aerobic and resistive training based on initial evaluation findings, risk stratification, comorbidities and participant's personal goals.       Expected Outcomes Short Term: Increase workloads from initial exercise prescription for resistance, speed, and METs.;Long Term: Improve cardiorespiratory fitness, muscular endurance and strength as measured by increased METs and functional capacity ( );Short Term: Perform resistance training exercises routinely during rehab and add in resistance training at home       Able to understand and use rate of perceived exertion (RPE) scale Yes       Intervention Provide education and explanation on how to use RPE scale       Expected Outcomes Short Term: Able to use RPE daily in rehab to express subjective intensity level;Long Term:  Able to  use RPE to guide intensity level when exercising independently       Able to understand and use Dyspnea scale Yes       Intervention Provide education and explanation on how to use Dyspnea scale       Expected Outcomes Short Term: Able to use Dyspnea scale daily in rehab to express subjective sense of shortness of breath during exertion;Long Term: Able to use Dyspnea scale to guide intensity level when exercising independently       Knowledge and understanding of Target Heart Rate Range (THRR) Yes       Intervention Provide education and explanation of THRR including how the numbers were predicted and where they are located for reference       Expected Outcomes Short Term: Able to state/look up THRR;Long Term: Able to use THRR to govern intensity when exercising independently;Short Term: Able to use daily as guideline for intensity in  rehab       Able to check pulse independently Yes       Intervention Provide education and demonstration on how to check pulse in carotid and radial arteries.;Review the importance of being able to check your own pulse for safety during independent exercise       Expected Outcomes Short Term: Able to explain why pulse checking is important during independent exercise;Long Term: Able to check pulse independently and accurately       Understanding of Exercise Prescription Yes       Intervention Provide education, explanation, and written materials on patient's individual exercise prescription       Expected Outcomes Short Term: Able to explain program exercise prescription;Long Term: Able to explain home exercise prescription to exercise independently                Copy of goals given to participant.

## 2021-03-09 ENCOUNTER — Other Ambulatory Visit: Payer: Self-pay

## 2021-03-09 ENCOUNTER — Encounter: Payer: Medicare Other | Attending: Cardiovascular Disease | Admitting: *Deleted

## 2021-03-09 DIAGNOSIS — Z955 Presence of coronary angioplasty implant and graft: Secondary | ICD-10-CM | POA: Insufficient documentation

## 2021-03-09 NOTE — Progress Notes (Signed)
Daily Session Note  Patient Details  Name: Terry Cook MRN: [id number] Date of Birth: 02/16/1951 Referring Provider:   Flowsheet Row Card[facility] Rehab [facility]07[facility]20[facility] Cardiac [facility]  Referring Provider Kathlyn Sacramento MD       Encounter Date: 03/09/2021  Check In:  Session Check In - 03/09/21 0808       Check-In   Supervising physician immediately available to respond to emergencies See telemetry face sheet for immediately available ER MD    Location [facility]-Cardiac & Pulmonary Rehab    Staff Present Heath Lark, RN, BSN, CCRP;Jessica [place], MA, RCEP, CCRP, CCET;Joseph [place], [place]    Virtual Visit No    Medication changes reported     No    Fall or balance concerns reported    No    Warm-up and Cool-down Performed on first and last piece of equipment    Resistance Training Performed Yes    VAD Patient? No    PAD/SET Patient? No      Pain Assessment   Currently in Pain? No/denies                Social History   Tobacco Use  Smoking Status Never  Smokeless Tobacco Never    Goals Met:  Exercise tolerated well Personal goals reviewed No report of concerns or symptoms today  Goals Unmet:  Not Applicable  Comments: First full day of exercise!  Patient was oriented to gym and equipment including functions, settings, policies, and procedures.  Patient's individual exercise prescription and treatment plan were reviewed.  All starting workloads were established based on the results of the 6 minute walk test done at initial orientation visit.  The plan for exercise progression was also introduced and progression will be customized based on patient's performance and goals.    Dr. Emily Filbert is Medical Director for [place].  Dr. Ottie Glazier is Medical Director for [facility] Pulmonary Rehabilitation.

## 2021-03-12 ENCOUNTER — Encounter: Payer: Medicare Other | Admitting: *Deleted

## 2021-03-12 ENCOUNTER — Other Ambulatory Visit: Payer: Self-pay

## 2021-03-12 DIAGNOSIS — Z955 Presence of coronary angioplasty implant and graft: Secondary | ICD-10-CM | POA: Diagnosis not present

## 2021-03-12 MED ORDER — EZETIMIBE 10 MG PO TABS: 10.0000 mg | ORAL_TABLET | Freq: Every day | ORAL | 3 refills | Status: DC

## 2021-03-12 NOTE — Telephone Encounter (Signed)
Left voicemail message to call back for review of results and recommendations.  

## 2021-03-12 NOTE — Progress Notes (Signed)
Daily Session Note  Patient Details  Name: Terry Cook MRN: [id number] Date of Birth: 12/29/1950 Referring Provider:   Flowsheet Row Cardiac Rehab from 03/07/2021 in [facility] Cardiac and Pulmonary Rehab  Referring Provider Arida, Muhammad MD       Encounter Date: 03/12/2021  Check In:  Session Check In - 03/12/21 0754       Check-In   Supervising physician immediately available to respond to emergencies See telemetry face sheet for immediately available ER MD    Location [facility]-Cardiac & Pulmonary Rehab    Staff Present Susanne Bice, RN, BSN, CCRP;Kelly Hayes, BS, ACSM CEP, Exercise Physiologist;Jessica Hawkins, MA, RCEP, CCRP, CCET;Joseph Hood, RCP,RRT,BSRT    Virtual Visit No    Medication changes reported     No    Fall or balance concerns reported    No    Warm-up and Cool-down Performed on first and last piece of equipment    Resistance Training Performed Yes    VAD Patient? No    PAD/SET Patient? No      Pain Assessment   Currently in Pain? No/denies                Social History   Tobacco Use  Smoking Status Never  Smokeless Tobacco Never    Goals Met:  Independence with exercise equipment Exercise tolerated well No report of concerns or symptoms today  Goals Unmet:  Not Applicable  Comments: Pt able to follow exercise prescription today without complaint.  Will continue to monitor for progression.    Dr. Mark Miller is Medical Director for [facility].  Dr. Fuad Aleskerov is Medical Director for [facility] Pulmonary Rehabilitation. 

## 2021-03-12 NOTE — Telephone Encounter (Signed)
Reviewed results and recommendations with patient. Advised scheduling would call closer to that time for the repeat labs. Medication sent into pharmacy he confirmed. Patient was agreeable with plan and no further questions at this time.

## 2021-03-14 ENCOUNTER — Other Ambulatory Visit: Payer: Self-pay

## 2021-03-14 DIAGNOSIS — Z955 Presence of coronary angioplasty implant and graft: Secondary | ICD-10-CM

## 2021-03-14 NOTE — Progress Notes (Signed)
Daily Session Note  Patient Details  Name: Terry Cook MRN: [id number] Date of Birth: 1951-04-03 Referring Provider:   Flowsheet Row Cardiac Rehab from 03/07/2021 in [facility] Cardiac and Pulmonary Rehab  Referring Provider Kathlyn Sacramento MD       Encounter Date: 03/14/2021  Check In:  Session Check In - 03/14/21 0710       Check-In   Supervising physician immediately available to respond to emergencies See telemetry face sheet for immediately available ER MD    Location [facility]-Cardiac & Pulmonary Rehab    Staff Present Birdie Sons, MPA, RN;Amanda Oletta Darter, BA, ACSM CEP, Exercise Physiologist;Melissa Caiola, RDN, LDN    Virtual Visit No    Medication changes reported     No    Fall or balance concerns reported    No    Warm-up and Cool-down Performed on first and last piece of equipment    Resistance Training Performed Yes    VAD Patient? No    PAD/SET Patient? No      Pain Assessment   Currently in Pain? No/denies                Social History   Tobacco Use  Smoking Status Never  Smokeless Tobacco Never    Goals Met:  Independence with exercise equipment Exercise tolerated well Personal goals reviewed No report of concerns or symptoms today Strength training completed today  Goals Unmet:  Not Applicable  Comments: Pt able to follow exercise prescription today without complaint.  Will continue to monitor for progression. Reviewed home exercise with pt today.  Pt plans to use gym where he lives for exercise.  Reviewed THR, pulse, RPE, sign and symptoms, pulse oximetery and when to call 911 or MD.  Also discussed weather considerations and indoor options.  Pt voiced understanding.    Dr. Emily Filbert is Medical Director for [place].  Dr. Ottie Glazier is Medical Director for [facility] Pulmonary Rehabilitation.

## 2021-03-16 ENCOUNTER — Encounter: Payer: Medicare Other | Admitting: *Deleted

## 2021-03-16 ENCOUNTER — Other Ambulatory Visit: Payer: Self-pay

## 2021-03-16 DIAGNOSIS — Z955 Presence of coronary angioplasty implant and graft: Secondary | ICD-10-CM

## 2021-03-16 NOTE — Progress Notes (Signed)
Daily Session Note  Patient Details  Name: Terry Cook MRN: [id number] Date of Birth: [birth year] Referring Provider:   Flowsheet Row Cardiac Rehab from 03/07/2021 in [facility] Cardiac and Pulmonary Rehab  Referring Provider Kathlyn Sacramento MD       Encounter Date: 03/16/2021  Check In:  Session Check In - 03/16/21 0806       Check-In   Supervising physician immediately available to respond to emergencies See telemetry face sheet for immediately available ER MD    Location [facility]-Cardiac & Pulmonary Rehab    Staff Present Heath Lark, RN, BSN, CCRP;Joseph [place], RCP,RRT,BSRT;Jessica [place], [place], [place], CCRP, CCET    Virtual Visit No    Medication changes reported     No    Fall or balance concerns reported    No    Warm-up and Cool-down Performed on first and last piece of equipment    Resistance Training Performed Yes    VAD Patient? No    PAD/SET Patient? No      Pain Assessment   Currently in Pain? No/denies                Social History   Tobacco Use  Smoking Status Never  Smokeless Tobacco Never    Goals Met:  Independence with exercise equipment Exercise tolerated well No report of concerns or symptoms today  Goals Unmet:  Not Applicable  Comments: Pt able to follow exercise prescription today without complaint.  Will continue to monitor for progression.    Dr. Emily Filbert is Medical Director for [place].  Dr. Ottie Glazier is Medical Director for [facility] Pulmonary Rehabilitation.

## 2021-03-20 ENCOUNTER — Other Ambulatory Visit: Payer: Self-pay

## 2021-03-20 DIAGNOSIS — Z955 Presence of coronary angioplasty implant and graft: Secondary | ICD-10-CM

## 2021-03-20 NOTE — Progress Notes (Signed)
Completed initial RD consultation ?

## 2021-03-20 NOTE — Progress Notes (Signed)
Daily Session Note  Patient Details  Name: Terry Cook MRN: [id number] Date of Birth: 1950-06-11 Referring Provider:   Flowsheet Row Cardiac Rehab from 03/07/2021 in [facility] Cardiac and Pulmonary Rehab  Referring Provider Kathlyn Sacramento MD       Encounter Date: 03/20/2021  Check In:  Session Check In - 03/20/21 0716       Check-In   Supervising physician immediately available to respond to emergencies See telemetry face sheet for immediately available ER MD    Location [facility]-Cardiac & Pulmonary Rehab    Staff Present Birdie Sons, MPA, RN;Amanda Oletta Darter, BA, ACSM CEP, Exercise Physiologist;Jessica Luan [place], MA, RCEP, CCRP, CCET    Virtual Visit No    Medication changes reported     No    Fall or balance concerns reported    No    Warm-up and Cool-down Performed on first and last piece of equipment    Resistance Training Performed Yes    VAD Patient? No    PAD/SET Patient? No      Pain Assessment   Currently in Pain? No/denies                Social History   Tobacco Use  Smoking Status Never  Smokeless Tobacco Never    Goals Met:  Independence with exercise equipment Exercise tolerated well No report of concerns or symptoms today Strength training completed today  Goals Unmet:  Not Applicable  Comments: Pt able to follow exercise prescription today without complaint.  Will continue to monitor for progression.    Dr. Emily Filbert is Medical Director for [place].  Dr. Ottie Glazier is Medical Director for [facility] Pulmonary Rehabilitation.

## 2021-03-21 DIAGNOSIS — Z955 Presence of coronary angioplasty implant and graft: Secondary | ICD-10-CM | POA: Diagnosis not present

## 2021-03-21 NOTE — Progress Notes (Signed)
Daily Session Note  Patient Details  Name: Terry Cook MRN: [id number] Date of Birth: 02-Jul-1950 Referring Provider:   Flowsheet Row Cardiac Rehab from 03/07/2021 in [facility] Cardiac and Pulmonary Rehab  Referring Provider Kathlyn Sacramento MD       Encounter Date: 03/21/2021  Check In:  Session Check In - 03/21/21 0723       Check-In   Supervising physician immediately available to respond to emergencies See telemetry face sheet for immediately available ER MD    Location [facility]-Cardiac & Pulmonary Rehab    Staff Present Birdie Sons, MPA, Elveria Rising, BA, ACSM CEP, Exercise Physiologist;Joseph Tessie Fass, [place]    Virtual Visit No    Medication changes reported     No    Fall or balance concerns reported    No    Warm-up and Cool-down Performed on first and last piece of equipment    Resistance Training Performed Yes    VAD Patient? No    PAD/SET Patient? No      Pain Assessment   Currently in Pain? No/denies                Social History   Tobacco Use  Smoking Status Never  Smokeless Tobacco Never    Goals Met:  Independence with exercise equipment Exercise tolerated well No report of concerns or symptoms today Strength training completed today  Goals Unmet:  Not Applicable  Comments: Pt able to follow exercise prescription today without complaint.  Will continue to monitor for progression.    Dr. Emily Filbert is Medical Director for [place].  Dr. Ottie Glazier is Medical Director for [facility] Pulmonary Rehabilitation.

## 2021-03-22 ENCOUNTER — Other Ambulatory Visit: Payer: Self-pay | Admitting: Physician Assistant

## 2021-03-22 DIAGNOSIS — I10 Essential (primary) hypertension: Secondary | ICD-10-CM

## 2021-03-26 ENCOUNTER — Encounter: Payer: Medicare Other | Admitting: *Deleted

## 2021-03-26 ENCOUNTER — Other Ambulatory Visit: Payer: Self-pay

## 2021-03-26 DIAGNOSIS — Z955 Presence of coronary angioplasty implant and graft: Secondary | ICD-10-CM

## 2021-03-26 NOTE — Progress Notes (Signed)
Daily Session Note  Patient Details  Name: Terry Cook MRN: [id number] Date of Birth: 06-26-1950 Referring Provider:   Flowsheet Row Cardiac Rehab from 03/07/2021 in [facility] Cardiac and Pulmonary Rehab  Referring Provider Kathlyn Sacramento MD       Encounter Date: 03/26/2021  Check In:  Session Check In - 03/26/21 0806       Check-In   Supervising physician immediately available to respond to emergencies See telemetry face sheet for immediately available ER MD    Location [facility]-Cardiac & Pulmonary Rehab    Staff Present Heath Lark, RN, BSN, CCRP;Joseph [place], RCP,RRT,BSRT;Kelly [place], [place], ACSM CEP, Exercise Physiologist    Virtual Visit No    Medication changes reported     No    Fall or balance concerns reported    No    Warm-up and Cool-down Performed on first and last piece of equipment    Resistance Training Performed Yes    VAD Patient? No    PAD/SET Patient? No      Pain Assessment   Currently in Pain? No/denies                Social History   Tobacco Use  Smoking Status Never  Smokeless Tobacco Never    Goals Met:  Independence with exercise equipment Exercise tolerated well No report of concerns or symptoms today  Goals Unmet:  Not Applicable  Comments: Pt able to follow exercise prescription today without complaint.  Will continue to monitor for progression.    Dr. Emily Filbert is Medical Director for [place].  Dr. Ottie Glazier is Medical Director for [facility] Pulmonary Rehabilitation.

## 2021-03-28 ENCOUNTER — Other Ambulatory Visit: Payer: Self-pay

## 2021-03-28 DIAGNOSIS — Z955 Presence of coronary angioplasty implant and graft: Secondary | ICD-10-CM

## 2021-03-28 NOTE — Progress Notes (Signed)
Daily Session Note  Patient Details  Name: Terry Cook MRN: [id number] Date of Birth: 12-May-1950 Referring Provider:   Flowsheet Row Cardiac Rehab from 03/07/2021 in [facility] Cardiac and Pulmonary Rehab  Referring Provider Kathlyn Sacramento MD       Encounter Date: 03/28/2021  Check In:  Session Check In - 03/28/21 0733       Check-In   Supervising physician immediately available to respond to emergencies See telemetry face sheet for immediately available ER MD    Location [facility]-Cardiac & Pulmonary Rehab    Staff Present Birdie Sons, MPA, Elveria Rising, BA, ACSM CEP, Exercise Physiologist;Joseph Tessie Fass, [place]    Virtual Visit No    Medication changes reported     No    Fall or balance concerns reported    No    Warm-up and Cool-down Performed on first and last piece of equipment    Resistance Training Performed Yes    VAD Patient? No    PAD/SET Patient? No      Pain Assessment   Currently in Pain? No/denies                Social History   Tobacco Use  Smoking Status Never  Smokeless Tobacco Never    Goals Met:  Independence with exercise equipment Exercise tolerated well No report of concerns or symptoms today Strength training completed today  Goals Unmet:  Not Applicable  Comments: Pt able to follow exercise prescription today without complaint.  Will continue to monitor for progression.    Dr. Emily Filbert is Medical Director for [place].  Dr. Ottie Glazier is Medical Director for [facility] Pulmonary Rehabilitation.

## 2021-04-04 ENCOUNTER — Other Ambulatory Visit: Payer: Self-pay

## 2021-04-04 ENCOUNTER — Encounter: Payer: Medicare Other | Admitting: *Deleted

## 2021-04-04 ENCOUNTER — Encounter: Payer: Self-pay | Admitting: *Deleted

## 2021-04-04 DIAGNOSIS — Z955 Presence of coronary angioplasty implant and graft: Secondary | ICD-10-CM

## 2021-04-04 NOTE — Progress Notes (Signed)
Daily Session Note  Patient Details  Name: Terry Cook MRN: [id number] Date of Birth: [birth year] Referring Provider:   Flowsheet Row Cardiac Rehab from 03/07/2021 in [facility]  Referring Provider Kathlyn Sacramento MD       Encounter Date: 04/04/2021  Check In:  Session Check In - 04/04/21 0757       Check-In   Supervising physician immediately available to respond to emergencies See telemetry face sheet for immediately available ER MD    Location [facility]    Staff Present Heath Lark, RN, BSN, CCRP;Joseph Fayette, RCP,RRT,BSRT;Melissa Trimont, [place], LDN    Virtual Visit No    Medication changes reported     No    Fall or balance concerns reported    No    Warm-up and Cool-down Performed on first and last piece of equipment    Resistance Training Performed Yes    VAD Patient? No    PAD/SET Patient? No      Pain Assessment   Currently in Pain? No/denies                Social History   Tobacco Use  Smoking Status Never  Smokeless Tobacco Never    Goals Met:  Independence with exercise equipment Exercise tolerated well No report of concerns or symptoms today  Goals Unmet:  Not Applicable  Comments: Pt able to follow exercise prescription today without complaint.  Will continue to monitor for progression.    Dr. Emily Filbert is Medical Director for [place].  Dr. Ottie Glazier is Medical Director for [facility].

## 2021-04-04 NOTE — Progress Notes (Signed)
Cardiac Individual Treatment Plan  Patient Details  Name: OSCAR HANK MRN: [id number] Date of Birth: [birth year] Referring Provider:   Flowsheet Row Cardiac Rehab from 03/07/2021 in [facility] Cardiac and Pulmonary Rehab  Referring Provider Kathlyn Sacramento MD       Initial Encounter Date:  Flowsheet Row Cardiac Rehab from 03/07/2021 in [facility] Cardiac and Pulmonary Rehab  Date 03/07/21       Visit Diagnosis: Status post coronary artery stent placement  Patient's Home Medications on Admission:  Current Outpatient Medications:    acetaminophen (TYLENOL) 500 MG tablet, Take 1,000 mg by mouth every 8 (eight) hours as needed for mild pain or moderate pain., Disp: , Rfl:    ALPRAZolam (XANAX) 0.5 MG tablet, Take 0.5 mg by mouth 2 (two) times daily as needed for anxiety., Disp: , Rfl:    amLODipine (NORVASC) 10 MG tablet, Take 1 tablet (10 mg total) by mouth daily., Disp: 90 tablet, Rfl: 1   aspirin EC 81 MG tablet, Take 81 mg by mouth daily., Disp: , Rfl:    atorvastatin (LIPITOR) 80 MG tablet, Take 1 tablet (80 mg total) by mouth every evening., Disp: 90 tablet, Rfl: 1   clopidogrel (PLAVIX) 75 MG tablet, Take 1 tablet (75 mg total) by mouth daily., Disp: 90 tablet, Rfl: 0   diclofenac sodium (VOLTAREN) 1 % GEL, Apply 1 application topically 3 (three) times daily. Apply to feet, Disp: , Rfl:    ezetimibe (ZETIA) 10 MG tablet, Take 1 tablet (10 mg total) by mouth daily., Disp: 90 tablet, Rfl: 3   losartan-hydrochlorothiazide (HYZAAR) 100-25 MG tablet, Take 1 tablet by mouth daily., Disp: , Rfl:    Oxycodone HCl 10 MG TABS, Take 10 mg by mouth 3 (three) times daily., Disp: , Rfl:    polycarbophil (FIBERCON) 625 MG tablet, Take 1,250 mg by mouth daily., Disp: , Rfl:    sertraline (ZOLOFT) 25 MG tablet, Take 75 mg by mouth daily., Disp: , Rfl:    silodosin (RAPAFLO) 8 MG CAPS capsule, Take 1 capsule (8 mg total) by mouth daily with breakfast., Disp: 90 capsule, Rfl: 3   zolpidem (AMBIEN) 10 MG  tablet, Take 10 mg by mouth at bedtime., Disp: , Rfl:   Past Medical History: Past Medical History:  Diagnosis Date   Anxiety    CAD (coronary artery disease), native coronary artery 06/04/2011   s/p MI with PCI to left circ   Chronic lower back pain    DDD (degenerative disc disease), lumbar    Depression    High cholesterol    History of kidney stones    Hypertension    Irregular heart beats    MI (myocardial infarction) ([place]) 05/2011   OSA on CPAP     Tobacco Use: Social History   Tobacco Use  Smoking Status Never  Smokeless Tobacco Never    Labs: Recent Review Flowsheet Data     Labs for ITP Cardiac and Pulmonary Rehab Latest Ref Rng & Units 06/03/2011 06/04/2011 11/18/2013 02/26/2021   Cholestrol 100 - 199 mg/dL - 133 139 141   LDLCALC 0 - 99 mg/dL - 69 63 81   LDLDIRECT 0 - 99 mg/dL - - - 82   HDL >39 mg/dL - 35(L) 38(L) 32(L)   Trlycerides 0 - 149 mg/dL - 147 190(H) 163(H)   TCO2 0 - 100 mmol/L 32 - - -        Exercise Target Goals: Exercise Program Goal: Individual exercise prescription set using results from initial  6 min walk test and THRR while considering  patients activity barriers and safety.   Exercise Prescription Goal: Initial exercise prescription builds to 30-45 minutes a day of aerobic activity, 2-3 days per week.  Home exercise guidelines will be given to patient during program as part of exercise prescription that the participant will acknowledge.   Education: Aerobic Exercise: - Group verbal and visual presentation on the components of exercise prescription. Introduces F.I.T.T principle from ACSM for exercise prescriptions.  Reviews F.I.T.T. principles of aerobic exercise including progression. Written material given at graduation.   Education: Resistance Exercise: - Group verbal and visual presentation on the components of exercise prescription. Introduces F.I.T.T principle from ACSM for exercise prescriptions  Reviews F.I.T.T. principles of  resistance exercise including progression. Written material given at graduation.    Education: Exercise & Equipment Safety: - Individual verbal instruction and demonstration of equipment use and safety with use of the equipment. Flowsheet Row Cardiac Rehab from 03/28/2021 in [facility] Cardiac and Pulmonary Rehab  Date 03/07/21  Educator [facility]  Instruction Review Code 1- Verbalizes Understanding       Education: Exercise Physiology & General Exercise Guidelines: - Group verbal and written instruction with models to review the exercise physiology of the cardiovascular system and associated critical values. Provides general exercise guidelines with specific guidelines to those with heart or lung disease.  Flowsheet Row Cardiac Rehab from 03/28/2021 in [facility] Cardiac and Pulmonary Rehab  Date 03/28/21  Educator [facility]  Instruction Review Code 1- Verbalizes Understanding       Education: Flexibility, Balance, Mind/Body Relaxation: - Group verbal and visual presentation with interactive activity on the components of exercise prescription. Introduces F.I.T.T principle from ACSM for exercise prescriptions. Reviews F.I.T.T. principles of flexibility and balance exercise training including progression. Also discusses the mind body connection.  Reviews various relaxation techniques to help reduce and manage stress (i.e. Deep breathing, progressive muscle relaxation, and visualization). Balance handout provided to take home. Written material given at graduation.   Activity Barriers & Risk Stratification:  Activity Barriers & Cardiac Risk Stratification - 03/07/21 1317       Activity Barriers & Cardiac Risk Stratification   Activity Barriers Arthritis;Back Problems;Neck/Spine Problems;Deconditioning;Muscular Weakness;Shortness of Breath;Balance Concerns   cervical stenosis, chronic low back pain with DDD   Cardiac Risk Stratification Moderate             6 Minute Walk:  6 Minute Walk     Row Name  03/07/21 1316         6 Minute Walk   Phase Initial     Distance 1360 feet     Walk Time 6 minutes     # of Rest Breaks 0     MPH 2.58     METS 2.88     RPE 8     VO2 Peak 10.07     Symptoms Yes (comment)     Comments tail bone/back pain 1/10, burning in thighs, SOB     Resting HR 65 bpm     Resting BP 144/86     Resting Oxygen Saturation  96 %     Exercise Oxygen Saturation  during 6 min walk 96 %     Max Ex. HR 90 bpm     Max Ex. BP 158/86              Oxygen Initial Assessment:   Oxygen Re-Evaluation:   Oxygen Discharge (Final Oxygen Re-Evaluation):   Initial Exercise Prescription:  Initial Exercise Prescription -  03/07/21 1300       Date of Initial Exercise RX and Referring Provider   Date 03/07/21    Referring Provider Kathlyn Sacramento MD      Oxygen   Maintain Oxygen Saturation 88% or higher      Treadmill   MPH 2.3    Grade 0.5    Minutes 15    METs 2.76      Recumbant Bike   Level 2    RPM 50    Watts 24    Minutes 15    METs 2.8      REL-XR   Level 2    Speed 50    Minutes 15    METs 2.8      Track   Laps 35    Minutes 15    METs 2.9      Prescription Details   Frequency (times per week) 3    Duration Progress to 30 minutes of continuous aerobic without signs/symptoms of physical distress      Intensity   THRR 40-80% of Max Heartrate 99-133    Ratings of Perceived Exertion 11-13    Perceived Dyspnea 0-4      Progression   Progression Continue to progress workloads to maintain intensity without signs/symptoms of physical distress.      Resistance Training   Training Prescription Yes    Weight 4  lb    Reps 10-15             Perform Capillary Blood Glucose checks as needed.  Exercise Prescription Changes:   Exercise Prescription Changes     Row Name 03/07/21 1300 03/12/21 1000 03/26/21 1700         Response to Exercise   Blood Pressure (Admit) 144/86 124/56 126/78     Blood Pressure (Exercise) 158/86  150/60 --     Blood Pressure (Exit) 124/58 122/60 128/64     Heart Rate (Admit) 65 bpm 75 bpm 80 bpm     Heart Rate (Exercise) 90 bpm 102 bpm 101 bpm     Heart Rate (Exit) 70 bpm 68 bpm 78 bpm     Oxygen Saturation (Admit) 96 % -- --     Oxygen Saturation (Exercise) 96 % -- --     Rating of Perceived Exertion (Exercise) 8 12 13      Symptoms tail bone/back pain 1/10, thighs burning, SOB none none     Comments walk test results 1st full day of exercise --     Duration -- Progress to 30 minutes of  aerobic without signs/symptoms of physical distress Progress to 30 minutes of  aerobic without signs/symptoms of physical distress     Intensity -- THRR unchanged THRR unchanged       Progression   Progression -- Continue to progress workloads to maintain intensity without signs/symptoms of physical distress. Continue to progress workloads to maintain intensity without signs/symptoms of physical distress.     Average METs -- 2.97 5.25       Resistance Training   Training Prescription -- Yes Yes     Weight -- 4 lb 6 lb     Reps -- 10-15 10-15       Interval Training   Interval Training -- No No       Treadmill   MPH -- 2.5 --     Grade -- 0.5 --     Minutes -- 15 --     METs -- 3.09 --  Recumbant Bike   Level -- 2 --     Watts -- 25 --     Minutes -- 15 --     METs -- 2.86 --       NuStep   Level -- -- 4     Minutes -- -- 15     METs -- -- 5.7       REL-XR   Level -- -- 4     Minutes -- -- 15     METs -- -- 4.8              Exercise Comments:   Exercise Comments     Row Name 03/09/21 0808           Exercise Comments First full day of exercise!  Patient was oriented to gym and equipment including functions, settings, policies, and procedures.  Patient's individual exercise prescription and treatment plan were reviewed.  All starting workloads were established based on the results of the 6 minute walk test done at initial orientation visit.  The plan for  exercise progression was also introduced and progression will be customized based on patient's performance and goals.                Exercise Goals and Review:   Exercise Goals     Row Name 03/07/21 1338             Exercise Goals   Increase Physical Activity Yes       Intervention Provide advice, education, support and counseling about physical activity/exercise needs.;Develop an individualized exercise prescription for aerobic and resistive training based on initial evaluation findings, risk stratification, comorbidities and participant's personal goals.       Expected Outcomes Short Term: Attend rehab on a regular basis to increase amount of physical activity.;Long Term: Add in home exercise to make exercise part of routine and to increase amount of physical activity.;Long Term: Exercising regularly at least 3-5 days a week.       Increase Strength and Stamina Yes       Intervention Provide advice, education, support and counseling about physical activity/exercise needs.;Develop an individualized exercise prescription for aerobic and resistive training based on initial evaluation findings, risk stratification, comorbidities and participant's personal goals.       Expected Outcomes Short Term: Increase workloads from initial exercise prescription for resistance, speed, and METs.;Long Term: Improve cardiorespiratory fitness, muscular endurance and strength as measured by increased METs and functional capacity (6MWT);Short Term: Perform resistance training exercises routinely during rehab and add in resistance training at home       Able to understand and use rate of perceived exertion (RPE) scale Yes       Intervention Provide education and explanation on how to use RPE scale       Expected Outcomes Short Term: Able to use RPE daily in rehab to express subjective intensity level;Long Term:  Able to use RPE to guide intensity level when exercising independently       Able to understand  and use Dyspnea scale Yes       Intervention Provide education and explanation on how to use Dyspnea scale       Expected Outcomes Short Term: Able to use Dyspnea scale daily in rehab to express subjective sense of shortness of breath during exertion;Long Term: Able to use Dyspnea scale to guide intensity level when exercising independently       Knowledge and understanding of Target Heart Rate Range (THRR) Yes  Intervention Provide education and explanation of THRR including how the numbers were predicted and where they are located for reference       Expected Outcomes Short Term: Able to state/look up THRR;Long Term: Able to use THRR to govern intensity when exercising independently;Short Term: Able to use daily as guideline for intensity in rehab       Able to check pulse independently Yes       Intervention Provide education and demonstration on how to check pulse in carotid and radial arteries.;Review the importance of being able to check your own pulse for safety during independent exercise       Expected Outcomes Short Term: Able to explain why pulse checking is important during independent exercise;Long Term: Able to check pulse independently and accurately       Understanding of Exercise Prescription Yes       Intervention Provide education, explanation, and written materials on patient's individual exercise prescription       Expected Outcomes Short Term: Able to explain program exercise prescription;Long Term: Able to explain home exercise prescription to exercise independently                Exercise Goals Re-Evaluation :  Exercise Goals Re-Evaluation     Row Name 03/09/21 0809 03/12/21 1042 03/14/21 0747 03/26/21 1736       Exercise Goal Re-Evaluation   Exercise Goals Review Able to understand and use rate of perceived exertion (RPE) scale;Knowledge and understanding of Target Heart Rate Range (THRR);Understanding of Exercise Prescription;Able to understand and use Dyspnea  scale Increase Physical Activity;Increase Strength and Stamina Increase Physical Activity;Increase Strength and Stamina Increase Physical Activity;Increase Strength and Stamina    Comments Reviewed RPE and dyspnea scales, THR and program prescription with pt today.  Pt voiced understanding and was given a copy of goals to take home. Tevita did well his first full session of exercise at rehab. He tolerated his exercise prescription well and even increased to 2.5 speed on the treadmill. His RPEs were appropriate. Will continue to monitor as he progresses through the program. Reviewed home exercise with pt today.  Pt plans to use gym where he lives for exercise.  Reviewed THR, pulse, RPE, sign and symptoms, pulse oximetery and when to call 911 or MD.  Also discussed weather considerations and indoor options.  Pt voiced understanding. Monnie is progressing well and has increase average MET level to 5.25.  he has also increased to 6 lb for strength work.    Expected Outcomes Short: Use RPE daily to regulate intensity. Long: Follow program prescription in THR. Short: Continue current exercise prescription and increase when appropriate Long: Gradually increase overall MET level Short: monitor HR when exercising on his own Long: maintain exercise independently Short: continue to attend consistently Long: continue to build stamina             Discharge Exercise Prescription (Final Exercise Prescription Changes):  Exercise Prescription Changes - 03/26/21 1700       Response to Exercise   Blood Pressure (Admit) 126/78    Blood Pressure (Exit) 128/64    Heart Rate (Admit) 80 bpm    Heart Rate (Exercise) 101 bpm    Heart Rate (Exit) 78 bpm    Rating of Perceived Exertion (Exercise) 13    Symptoms none    Duration Progress to 30 minutes of  aerobic without signs/symptoms of physical distress    Intensity THRR unchanged      Progression   Progression Continue to  progress workloads to maintain intensity  without signs/symptoms of physical distress.    Average METs 5.25      Resistance Training   Training Prescription Yes    Weight 6 lb    Reps 10-15      Interval Training   Interval Training No      NuStep   Level 4    Minutes 15    METs 5.7      REL-XR   Level 4    Minutes 15    METs 4.8             Nutrition:  Target Goals: Understanding of nutrition guidelines, daily intake of sodium <1568m, cholesterol <2058m calories 30% from fat and 7% or less from saturated fats, daily to have 5 or more servings of fruits and vegetables.  Education: All About Nutrition: -Group instruction provided by verbal, written material, interactive activities, discussions, models, and posters to present general guidelines for heart healthy nutrition including fat, fiber, MyPlate, the role of sodium in heart healthy nutrition, utilization of the nutrition label, and utilization of this knowledge for meal planning. Follow up email sent as well. Written material given at graduation. [facility]  Education need identified 03/07/21       Biometrics:  Pre Biometrics - 03/07/21 1339       Pre Biometrics   Height 5' 6.5" (1.689 m)    Weight 194 lb 9.6 oz (88.3 kg)    BMI (Calculated) 30.94    Single Leg Stand 4.8 seconds              Nutrition Therapy Plan and Nutrition Goals:  Nutrition Therapy & Goals - 03/20/21 0728       Nutrition Therapy   Diet Heart Healthy Low Na    Drug/Food Interactions Statins/Certain Fruits    Protein (specify units) 70g    Fiber 30 grams    Whole Grain Foods 3 servings    Saturated Fats 12 max. grams    Fruits and Vegetables 8 servings/day    Sodium 1.5 grams      Personal Nutrition Goals   Nutrition Goal ST: Discussed introducing more vegetables instead of some of the servings of fruit, introduce one new vegetable per week, and pairing some fruit servings with protein and fat  to better balance snacks (yogurt with fruit, nuts/seeds or nut/seed butter with fruit) LT: increase variety, reduce overal fruit servings by at least 2, follow MyPlate guidelines for meals and the fiber, protein, fat combination for snacks.    Comments [age].o. M admitted to rehab s/p stent placement also presents with HTN, CAD, HLD. PMH includes previous MI. Relevant medications include lipitor, xanax, zoloft, ambien, fibercon, oxycodone. PYP 69. He repots having many heart healthy education sessions in his life as well as reading articles and feels he does not need more nutrition education at this time. He has no "junk" or fried foods, very little red meat, he uses olive oil mixed with lemon juice and some spices. He eats about 4 bananas, 4 oranges, and 3 apples per day. He eats broccoli and stringbeans - he feels he should get more, 1 baked potato per week. He reports not picking up a salt shaker for 40 years.  He will have yogurt with fresh blueberries during the day to help with hunger. B: scrambled egg (with some olive oil based margarine) 2x/week or fruit L: tu[place]wich with mustard or peanut  butter sandwich (whole wheat). D: sometimes bowl of cereal with low fat milk, Last night he had salmon with no butter and spices as well as a salad with olive oil and vinegar. Drinks: water 5-6 glasses. His constipation is alliviated by the fibercon he takes daily. Reviewed general heart healthy eating, balance in meals, and provided nutrition handout for him to review. Discussed introducing more vegetables instead of some of the servings of fruit, increasing variety, and pairing some fruit servings with protein and fat to better balance snacks (yogurt with fruit, nuts/seeds or nut/seed butter with fruit)      Intervention Plan   Intervention Prescribe, educate and counsel regarding individualized specific dietary modifications aiming towards targeted core components such as weight, hypertension, lipid management,  diabetes, heart failure and other comorbidities.    Expected Outcomes Short Term Goal: Understand basic principles of dietary content, such as calories, fat, sodium, cholesterol and nutrients.;Short Term Goal: A plan has been developed with personal nutrition goals set during dietitian appointment.;Long Term Goal: Adherence to prescribed nutrition plan.             Nutrition Assessments:  MEDIFICTS Score Key: ?70 Need to make dietary changes  40-70 Heart Healthy Diet ? 40 Therapeutic Level Cholesterol Diet  Flowsheet Row Cardiac Rehab from 03/07/2021 in [facility] Cardiac and Pulmonary Rehab  Picture Your Plate Total Score on Admission 69      Picture Your Plate Scores: <15 Unhealthy dietary pattern with much room for improvement. 41-50 Dietary pattern unlikely to meet recommendations for good health and room for improvement. 51-60 More healthful dietary pattern, with some room for improvement.  >60 Healthy dietary pattern, although there may be some specific behaviors that could be improved.    Nutrition Goals Re-Evaluation:  Nutrition Goals Re-Evaluation     [place] Name 03/14/21 0724             Goals   Comment Nutrition appointment scheduled for 12/12 after class.                Nutrition Goals Discharge (Final Nutrition Goals Re-Evaluation):  Nutrition Goals Re-Evaluation - 03/14/21 0724       Goals   Comment Nutrition appointment scheduled for 12/12 after class.             Psychosocial: Target Goals: Acknowledge presence or absence of significant depression and/or stress, maximize coping skills, provide positive support system. Participant is able to verbalize types and ability to use techniques and skills needed for reducing stress and depression.   Education: Stress, Anxiety, and Depression - Group verbal and visual presentation to define topics covered.  Reviews how body is impacted by stress, anxiety, and depression.  Also discusses healthy ways to  reduce stress and to treat/manage anxiety and depression.  Written material given at graduation. Flowsheet Row Cardiac Rehab from 03/28/2021 in [facility] Cardiac and Pulmonary Rehab  Date 03/21/21  Educator [facility]  Instruction Review Code 1- Verbalizes Understanding       Education: Sleep Hygiene -Provides group verbal and written instruction about how sleep can affect your health.  Define sleep hygiene, discuss sleep cycles and impact of sleep habits. Review good sleep hygiene tips.    Initial Review & Psychosocial Screening:  Initial Psych Review & Screening - 02/16/21 1010       Initial Review   Current issues with Current Psychotropic Meds;Current Stress Concerns    Source of Stress Concerns Financial    Comments Retired      [employer]  Good Support System? Yes   Wife , son and his wife  very close     Barriers   Psychosocial barriers to participate in program There are no identifiable barriers or psychosocial needs.;The patient should benefit from training in stress management and relaxation.      Screening Interventions   Interventions Encouraged to exercise;To provide support and resources with identified psychosocial needs;Provide feedback about the scores to participant    Expected Outcomes Short Term goal: Utilizing psychosocial counselor, staff and physician to assist with identification of specific Stressors or current issues interfering with healing process. Setting desired goal for each stressor or current issue identified.;Long Term Goal: Stressors or current issues are controlled or eliminated.;Short Term goal: Identification and review with participant of any Quality of Life or Depression concerns found by scoring the questionnaire.;Long Term goal: The participant improves quality of Life and PHQ9 Scores as seen by post scores and/or verbalization of changes             Quality of Life Scores:   Quality of Life - 03/07/21 1339       Quality of Life   Select  Quality of Life      Quality of Life Scores   Health/Function Pre 14.07 %    Socioeconomic Pre 13.56 %    Psych/Spiritual Pre 12.43 %    Family Pre 18.7 %    GLOBAL Pre 14.29 %            Scores of 19 and below usually indicate a poorer quality of life in these areas.  A difference of  2-3 points is a clinically meaningful difference.  A difference of 2-3 points in the total score of the Quality of Life Index has been associated with significant improvement in overall quality of life, self-image, physical symptoms, and general health in studies assessing change in quality of life.  PHQ-9: Recent Review Flowsheet Data     Depression screen [facility] 2/9 03/07/2021 03/14/2014 12/15/2013   Decreased Interest 1 0 0   Down, Depressed, Hopeless 1 1 0   PHQ - 2 Score 2 1 0   Altered sleeping 2 - -   Tired, decreased energy 2 - -   Change in appetite 0 - -   Feeling bad or failure about yourself  1 - -   Trouble concentrating 0 - -   Moving slowly or fidgety/restless 0 - -   Suicidal thoughts 0 - -   PHQ-9 Score 7 - -   Difficult doing work/chores Not difficult at all - -      Interpretation of Total Score  Total Score Depression Severity:  1-4 = Minimal depression, 5-9 = Mild depression, 10-14 = Moderate depression, 15-19 = Moderately severe depression, 20-27 = Severe depression   Psychosocial Evaluation and Intervention:  Psychosocial Evaluation - 02/16/21 1028       Psychosocial Evaluation & Interventions   Interventions Encouraged to exercise with the program and follow exercise prescription    Comments Shermaine has no barriers to attending the program. He lives with his wife of 56 years. Their son and daughter in law live close by and they talk with or see them several times a week. He does work some and enjoys working as it keeps him moving. He has completed Cardiac Rehab in the past and is looking forward to doing as much as he can to provide him guidance with his exercise. He has  made several nutrition changes and is ready to  review this with the RD. He does have some stress,financial,and some mild depression. He is on medication prescribed by his physician.  He does have some back problems and will let staff know if the exercise regimen increases his pain.  He shoudl do well with the program.    Expected Outcomes STG: Attending all scheduled sessions. Keeping stress and depression controlled. Learning and managing exercise/nutriton goals  LTG: Cassiel will be able to continue progress he gained in the program    Continue Psychosocial Services  Follow up required by staff             Psychosocial Re-Evaluation:  Psychosocial Re-Evaluation     [place] Name 03/14/21 0725             Psychosocial Re-Evaluation   Current issues with Current Stress Concerns;Current Sleep Concerns       Comments Shed says he has high stress.  He exercises to help and works part time.  He also reads.  He has taken courses on mindfulness and uses meditation to lower BP.  He wears a CPAP and takes a medication to help with sleep.       Expected Outcomes Short:  continue to use all tools he has to help manage stress Long: manage stress long term       Interventions Stress management education;Relaxation education;Encouraged to attend Cardiac Rehabilitation for the exercise                Psychosocial Discharge (Final Psychosocial Re-Evaluation):  Psychosocial Re-Evaluation - 03/14/21 0725       Psychosocial Re-Evaluation   Current issues with Current Stress Concerns;Current Sleep Concerns    Comments [place] says he has high stress.  He exercises to help and works part time.  He also reads.  He has taken courses on mindfulness and uses meditation to lower BP.  He wears a CPAP and takes a medication to help with sleep.    Expected Outcomes Short:  continue to use all tools he has to help manage stress Long: manage stress long term    Interventions Stress management  education;Relaxation education;Encouraged to attend Cardiac Rehabilitation for the exercise             Vocational Rehabilitation: Provide vocational rehab assistance to qualifying candidates.   Vocational Rehab Evaluation & Intervention:  Vocational Rehab - 02/16/21 1016       Initial Vocational Rehab Evaluation & Intervention   Assessment shows need for Vocational Rehabilitation No      Discharge Vocational Rehab   Discharge Vocational Rehabilitation retired             Education: Education Goals: Education classes will be provided on a variety of topics geared toward better understanding of heart health and risk factor modification. Participant will state understanding/return demonstration of topics presented as noted by education test scores.  Learning Barriers/Preferences:   General Cardiac Education Topics:  AED/CPR: - Group verbal and written instruction with the use of models to demonstrate the basic use of the AED with the basic ABC's of resuscitation.   Anatomy and Cardiac Procedures: - Group verbal and visual presentation and models provide information about basic cardiac anatomy and function. Reviews the testing methods done to diagnose heart disease and the outcomes of the test results. Describes the treatment choices: Medical Management, Angioplasty, or Coronary Bypass Surgery for treating various heart conditions including Myocardial Infarction, Angina, Valve Disease, and Cardiac Arrhythmias.  Written material given at graduation.   Medication Safety: -  Group verbal and visual instruction to review commonly prescribed medications for heart and lung disease. Reviews the medication, class of the drug, and side effects. Includes the steps to properly store meds and maintain the prescription regimen.  Written material given at graduation.   Intimacy: - Group verbal instruction through game format to discuss how heart and lung disease can affect sexual  intimacy. Written material given at graduation..   Know Your Numbers and Heart Failure: - Group verbal and visual instruction to discuss disease risk factors for cardiac and pulmonary disease and treatment options.  Reviews associated critical values for Overweight/Obesity, Hypertension, Cholesterol, and Diabetes.  Discusses basics of heart failure: signs/symptoms and treatments.  Introduces Heart Failure Zone chart for action plan for heart failure.  Written material given at graduation.   Infection Prevention: - Provides verbal and written material to individual with discussion of infection control including proper hand washing and proper equipment cleaning during exercise session. Flowsheet Row Cardiac Rehab from 03/28/2021 in [facility] Cardiac and Pulmonary Rehab  Date 03/07/21  Educator [facility]  Instruction Review Code 1- Verbalizes Understanding       Falls Prevention: - Provides verbal and written material to individual with discussion of falls prevention and safety. Flowsheet Row Cardiac Rehab from 03/28/2021 in [facility] Cardiac and Pulmonary Rehab  Date 02/16/21  Educator SB  Instruction Review Code 1- Verbalizes Understanding       Other: -Provides group and verbal instruction on various topics (see comments)   Knowledge Questionnaire Score:  Knowledge Questionnaire Score - 03/07/21 1340       Knowledge Questionnaire Score   Pre Score 25/26             Core Components/Risk Factors/Patient Goals at Admission:  Personal Goals and Risk Factors at Admission - 03/07/21 1340       Core Components/Risk Factors/Patient Goals on Admission    Weight Management Yes;Obesity;Weight Loss    Intervention Weight Management: Develop a combined nutrition and exercise program designed to reach desired caloric intake, while maintaining appropriate intake of nutrient and fiber, sodium and fats, and appropriate energy expenditure required for the weight goal.;Obesity: Provide education and  appropriate resources to help participant work on and attain dietary goals.;Weight Management: Provide education and appropriate resources to help participant work on and attain dietary goals.;Weight Management/Obesity: Establish reasonable short term and long term weight goals.    Admit Weight 194 lb 9.6 oz (88.3 kg)    Goal Weight: Short Term 190 lb (86.2 kg)    Goal Weight: Long Term 180 lb (81.6 kg)    Expected Outcomes Short Term: Continue to assess and modify interventions until short term weight is achieved;Long Term: Adherence to nutrition and physical activity/exercise program aimed toward attainment of established weight goal;Weight Loss: Understanding of general recommendations for a balanced deficit meal plan, which promotes 1-2 lb weight loss per week and includes a negative energy balance of [phone] kcal/d;Understanding recommendations for meals to include 15-35% energy as protein, 25-35% energy from fat, 35-60% energy from carbohydrates, less than 29m of dietary cholesterol, 20-35 gm of total fiber daily;Understanding of distribution of calorie intake throughout the day with the consumption of 4-5 meals/snacks    Hypertension Yes    Intervention Provide education on lifestyle modifcations including regular physical activity/exercise, weight management, moderate sodium restriction and increased consumption of fresh fruit, vegetables, and low fat dairy, alcohol moderation, and smoking cessation.;Monitor prescription use compliance.    Expected Outcomes Short Term: Continued assessment and intervention until BP is <  140/36m HG in hypertensive participants. < 130/852mHG in hypertensive participants with diabetes, heart failure or chronic kidney disease.;Long Term: Maintenance of blood pressure at goal levels.    Lipids Yes    Intervention Provide education and support for participant on nutrition & aerobic/resistive exercise along with prescribed medications to achieve LDL <7056mHDL >53m48m   Expected Outcomes Short Term: Participant states understanding of desired cholesterol values and is compliant with medications prescribed. Participant is following exercise prescription and nutrition guidelines.;Long Term: Cholesterol controlled with medications as prescribed, with individualized exercise RX and with personalized nutrition plan. Value goals: LDL < 70mg60mL > 40 mg.             Education:Diabetes - Individual verbal and written instruction to review signs/symptoms of diabetes, desired ranges of glucose level fasting, after meals and with exercise. Acknowledge that pre and post exercise glucose checks will be done for 3 sessions at entry of program.   Core Components/Risk Factors/Patient Goals Review:   Goals and Risk Factor Review     Row Name 03/14/21 0723             Core Components/Risk Factors/Patient Goals Review   Personal Goals Review Hypertension       Review HarveGradynrts taking all meds as directed.  He has started checking BP at home.  ususaly it runs high 140s - 80s.  He is having a kidney blood flow test.  BP 126/64 resting today.       Expected Outcomes Short; follow up with tests  Long: continue to monitor BP                Core Components/Risk Factors/Patient Goals at Discharge (Final Review):   Goals and Risk Factor Review - 03/14/21 0723       Core Components/Risk Factors/Patient Goals Review   Personal Goals Review Hypertension    Review HarveOsiasrts taking all meds as directed.  He has started checking BP at home.  ususaly it runs high 140s - 80s.  He is having a kidney blood flow test.  BP 126/64 resting today.    Expected Outcomes Short; follow up with tests  Long: continue to monitor BP             ITP Comments:  ITP Comments     Row Name 02/16/21 1023 03/07/21 1315 03/09/21 0808 03/20/21 0804 04/04/21 0707   ITP Comments Virtual orientation call completed today. he has an appointment on Date: 03/05/2021  for EP eval and  gym Orientation.  [facility] . Completed 6MWT and gym orientation. Initial ITP created and sent for review to Dr. Mark Emily Filbertical Director. First full day of exercise!  Patient was oriented to gym and equipment including functions, settings, policies, and procedures.  Patient's individual exercise prescription and treatment plan were reviewed.  All starting workloads were established based on the results of the 6 minute walk test done at initial orientation visit.  The plan for exercise progression was also introduced and progression will be customized based on patient's performance and goals. Completed initial RD consultation 30 Day review completed. Medical Director ITP review done, changes made as directed, and signed approval by Medical Director.            Comments:

## 2021-04-05 ENCOUNTER — Ambulatory Visit: Payer: Medicare Other

## 2021-04-05 DIAGNOSIS — I10 Essential (primary) hypertension: Secondary | ICD-10-CM

## 2021-04-06 ENCOUNTER — Encounter: Payer: Medicare Other | Admitting: *Deleted

## 2021-04-06 ENCOUNTER — Other Ambulatory Visit: Payer: Self-pay

## 2021-04-06 DIAGNOSIS — Z955 Presence of coronary angioplasty implant and graft: Secondary | ICD-10-CM

## 2021-04-06 NOTE — Progress Notes (Signed)
Daily Session Note  Patient Details  Name: Terry Cook MRN: [id number] Date of Birth: Apr 02, 1951 Referring Provider:   Flowsheet Row Cardiac Rehab from 03/07/2021 in [facility] Cardiac and Pulmonary Rehab  Referring Provider Kathlyn Sacramento MD       Encounter Date: 04/06/2021  Check In:  Session Check In - 04/06/21 0756       Check-In   Supervising physician immediately available to respond to emergencies See telemetry face sheet for immediately available ER MD    Location [facility]-Cardiac & Pulmonary Rehab    Staff Present Heath Lark, RN, BSN, CCRP;Joseph [place], RCP,RRT,BSRT;Jessica [place], [place], [place], CCRP, CCET    Virtual Visit No    Medication changes reported     No    Fall or balance concerns reported    No    Warm-up and Cool-down Performed on first and last piece of equipment    Resistance Training Performed Yes    VAD Patient? No    PAD/SET Patient? No      Pain Assessment   Currently in Pain? No/denies                Social History   Tobacco Use  Smoking Status Never  Smokeless Tobacco Never    Goals Met:  Independence with exercise equipment Exercise tolerated well No report of concerns or symptoms today  Goals Unmet:  Not Applicable  Comments: Pt able to follow exercise prescription today without complaint.  Will continue to monitor for progression.    Dr. Emily Filbert is Medical Director for [place].  Dr. Ottie Glazier is Medical Director for [facility] Pulmonary Rehabilitation.

## 2021-04-11 ENCOUNTER — Other Ambulatory Visit: Payer: Self-pay

## 2021-04-11 ENCOUNTER — Encounter: Payer: Medicare Other | Attending: Cardiovascular Disease

## 2021-04-11 DIAGNOSIS — I208 Other forms of angina pectoris: Secondary | ICD-10-CM | POA: Insufficient documentation

## 2021-04-11 DIAGNOSIS — Z955 Presence of coronary angioplasty implant and graft: Secondary | ICD-10-CM | POA: Insufficient documentation

## 2021-04-11 NOTE — Progress Notes (Signed)
Daily Session Note  Patient Details  Name: Terry Cook MRN: [id number] Date of Birth: [birth year] Referring Provider:   Flowsheet Row Cardiac Rehab from 03/07/2021 in [facility] Cardiac and Pulmonary Rehab  Referring Provider Kathlyn Sacramento MD       Encounter Date: 04/11/2021  Check In:  Session Check In - 04/11/21 0726       Check-In   Supervising physician immediately available to respond to emergencies See telemetry face sheet for immediately available ER MD    Location [facility]-Cardiac & Pulmonary Rehab    Staff Present Birdie Sons, MPA, Elveria Rising, BA, ACSM CEP, Exercise Physiologist;Joseph Tessie [place], [place]    Virtual Visit No    Medication changes reported     No    Fall or balance concerns reported    No    Warm-up and Cool-down Performed on first and last piece of equipment    Resistance Training Performed Yes    VAD Patient? No    PAD/SET Patient? No      Pain Assessment   Currently in Pain? No/denies                Social History   Tobacco Use  Smoking Status Never  Smokeless Tobacco Never    Goals Met:  Independence with exercise equipment Exercise tolerated well No report of concerns or symptoms today Strength training completed today  Goals Unmet:  Not Applicable  Comments: Pt able to follow exercise prescription today without complaint.  Will continue to monitor for progression.    Dr. Emily Filbert is Medical Director for [place].  Dr. Ottie Glazier is Medical Director for [facility] Pulmonary Rehabilitation.

## 2021-04-13 ENCOUNTER — Other Ambulatory Visit: Payer: Self-pay

## 2021-04-13 ENCOUNTER — Encounter: Payer: Medicare Other | Admitting: *Deleted

## 2021-04-13 DIAGNOSIS — Z955 Presence of coronary angioplasty implant and graft: Secondary | ICD-10-CM

## 2021-04-13 DIAGNOSIS — I208 Other forms of angina pectoris: Secondary | ICD-10-CM | POA: Diagnosis not present

## 2021-04-13 NOTE — Progress Notes (Signed)
Daily Session Note  Patient Details  Name: Terry Cook MRN: [id number] Date of Birth: [birth year] Referring Provider:   Flowsheet Row Cardiac Rehab from 03/07/2021 in [facility] Cardiac and Pulmonary Rehab  Referring Provider Kathlyn Sacramento MD       Encounter Date: 04/13/2021  Check In:  Session Check In - 04/13/21 0807       Check-In   Supervising physician immediately available to respond to emergencies See telemetry face sheet for immediately available ER MD    Location [facility]-Cardiac & Pulmonary Rehab    Staff Present Heath Lark, RN, BSN, CCRP;Joseph [place], RCP,RRT,BSRT;Jessica [place], [place], [place], CCRP, CCET    Virtual Visit No    Medication changes reported     No    Fall or balance concerns reported    No    Warm-up and Cool-down Performed on first and last piece of equipment    Resistance Training Performed Yes    VAD Patient? No    PAD/SET Patient? No      Pain Assessment   Currently in Pain? No/denies                Social History   Tobacco Use  Smoking Status Never  Smokeless Tobacco Never    Goals Met:  Independence with exercise equipment Exercise tolerated well No report of concerns or symptoms today  Goals Unmet:  Not Applicable  Comments: Pt able to follow exercise prescription today without complaint.  Will continue to monitor for progression.    Dr. Emily Filbert is Medical Director for [place].  Dr. Ottie Glazier is Medical Director for [facility] Pulmonary Rehabilitation.

## 2021-04-16 ENCOUNTER — Other Ambulatory Visit: Payer: Self-pay

## 2021-04-16 ENCOUNTER — Encounter: Payer: Medicare Other | Admitting: *Deleted

## 2021-04-16 DIAGNOSIS — Z955 Presence of coronary angioplasty implant and graft: Secondary | ICD-10-CM | POA: Diagnosis not present

## 2021-04-16 DIAGNOSIS — I208 Other forms of angina pectoris: Secondary | ICD-10-CM | POA: Diagnosis not present

## 2021-04-16 NOTE — Progress Notes (Signed)
Daily Session Note  Patient Details  Name: Terry Cook MRN: [id number] Date of Birth: 13-Jun-1950 Referring Provider:   Flowsheet Row Cardiac Rehab from 03/07/2021 in [facility] Cardiac and Pulmonary Rehab  Referring Provider Kathlyn Sacramento MD       Encounter Date: 04/16/2021  Check In:  Session Check In - 04/16/21 0806       Check-In   Supervising physician immediately available to respond to emergencies See telemetry face sheet for immediately available ER MD    Location [facility]-Cardiac & Pulmonary Rehab    Staff Present Earlean Shawl, BS, ACSM CEP, Exercise Physiologist;Joseph [place], [place];Heath Lark, RN, BSN, CCRP    Virtual Visit No    Medication changes reported     No    Fall or balance concerns reported    No    Warm-up and Cool-down Performed on first and last piece of equipment    Resistance Training Performed Yes    VAD Patient? No    PAD/SET Patient? No      Pain Assessment   Currently in Pain? No/denies                Social History   Tobacco Use  Smoking Status Never  Smokeless Tobacco Never    Goals Met:  Independence with exercise equipment Exercise tolerated well No report of concerns or symptoms today  Goals Unmet:  Not Applicable  Comments: Pt able to follow exercise prescription today without complaint.  Will continue to monitor for progression.    Dr. Emily Filbert is Medical Director for [place].  Dr. Ottie Glazier is Medical Director for [facility] Pulmonary Rehabilitation.

## 2021-04-19 ENCOUNTER — Telehealth: Payer: Medicare Other | Admitting: Cardiology

## 2021-04-19 ENCOUNTER — Encounter: Payer: Self-pay | Admitting: Cardiology

## 2021-04-19 VITALS — BP 110/80 | Ht 66.5 in | Wt 191.0 lb

## 2021-04-19 DIAGNOSIS — G4733 Obstructive sleep apnea (adult) (pediatric): Secondary | ICD-10-CM | POA: Diagnosis not present

## 2021-04-19 DIAGNOSIS — I10 Essential (primary) hypertension: Secondary | ICD-10-CM
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
## 2021-04-19 NOTE — Addendum Note (Signed)
Addended by: Antonieta Iba on: 04/19/2021 10:26 AM   Modules accepted: Orders

## 2021-04-19 NOTE — Patient Instructions (Signed)
Medication Instructions:  ?Your physician recommends that you continue on your current medications as directed. Please refer to the Current Medication list given to you today. ? ?*If you need a refill on your cardiac medications before your next appointment, please call your pharmacy* ? ?Testing/Procedures: ?Your physician has recommended that you have a sleep study. This test records several body functions during sleep, including: brain activity, eye movement, oxygen and carbon dioxide blood levels, heart rate and rhythm, breathing rate and rhythm, the flow of air through your mouth and nose, snoring, body muscle movements, and chest and belly movement. ? ?Follow-Up: ?At [facility], you and your health needs are our priority.  As part of our continuing mission to provide you with exceptional heart care, we have created designated Provider Care Teams.  These Care Teams include your primary Cardiologist (physician) and Advanced Practice Providers (APPs -  Physician Assistants and Nurse Practitioners) who all work together to provide you with the care you need, when you need it. ? ?Follow up with Dr. Turner after your sleep study ?

## 2021-04-20 ENCOUNTER — Other Ambulatory Visit: Payer: Self-pay

## 2021-04-20 ENCOUNTER — Encounter: Payer: Medicare Other | Admitting: *Deleted

## 2021-04-20 DIAGNOSIS — I208 Other forms of angina pectoris: Secondary | ICD-10-CM | POA: Diagnosis not present

## 2021-04-20 DIAGNOSIS — Z955 Presence of coronary angioplasty implant and graft: Secondary | ICD-10-CM

## 2021-04-20 NOTE — Progress Notes (Signed)
Daily Session Note  Patient Details  Name: Terry Cook MRN: [id number] Date of Birth: 1950-04-30 Referring Provider:   Flowsheet Row Cardiac Rehab from 03/07/2021 in [facility] Cardiac and Pulmonary Rehab  Referring Provider Kathlyn Sacramento MD       Encounter Date: 04/20/2021  Check In:  Session Check In - 04/20/21 0801       Check-In   Supervising physician immediately available to respond to emergencies See telemetry face sheet for immediately available ER MD    Location [facility]-Cardiac & Pulmonary Rehab    Staff Present Alberteen Sam, MA, RCEP, CCRP, CCET;Joseph [place], [place];Heath Lark, RN, BSN, CCRP    Virtual Visit No    Medication changes reported     No    Fall or balance concerns reported    No    Warm-up and Cool-down Performed on first and last piece of equipment    Resistance Training Performed Yes    VAD Patient? No    PAD/SET Patient? No      Pain Assessment   Currently in Pain? No/denies                Social History   Tobacco Use  Smoking Status Never  Smokeless Tobacco Never    Goals Met:  Independence with exercise equipment Exercise tolerated well No report of concerns or symptoms today  Goals Unmet:  Not Applicable  Comments: Pt able to follow exercise prescription today without complaint.  Will continue to monitor for progression.    Dr. Emily Filbert is Medical Director for [place].  Dr. Ottie Glazier is Medical Director for [facility] Pulmonary Rehabilitation.

## 2021-04-23 ENCOUNTER — Encounter: Payer: Medicare Other | Admitting: *Deleted

## 2021-04-23 ENCOUNTER — Other Ambulatory Visit: Payer: Self-pay

## 2021-04-23 DIAGNOSIS — Z955 Presence of coronary angioplasty implant and graft: Secondary | ICD-10-CM | POA: Diagnosis not present

## 2021-04-23 DIAGNOSIS — I208 Other forms of angina pectoris: Secondary | ICD-10-CM | POA: Diagnosis not present

## 2021-04-23 NOTE — Progress Notes (Signed)
Daily Session Note  Patient Details  Name: Terry Cook MRN: [id number] Date of Birth: 1951/03/23 Referring Provider:   Flowsheet Row Cardiac Rehab from 03/07/2021 in [facility] Cardiac and Pulmonary Rehab  Referring Provider Kathlyn Sacramento MD       Encounter Date: 04/23/2021  Check In:  Session Check In - 04/23/21 0803       Check-In   Supervising physician immediately available to respond to emergencies See telemetry face sheet for immediately available ER MD    Location [facility]-Cardiac & Pulmonary Rehab    Staff Present Earlean Shawl, BS, ACSM CEP, Exercise Physiologist;Joseph [place], [place];Heath Lark, RN, BSN, CCRP    Virtual Visit No    Medication changes reported     No    Fall or balance concerns reported    No    Warm-up and Cool-down Performed on first and last piece of equipment    Resistance Training Performed Yes    VAD Patient? No    PAD/SET Patient? No      Pain Assessment   Currently in Pain? No/denies                Social History   Tobacco Use  Smoking Status Never  Smokeless Tobacco Never    Goals Met:  Independence with exercise equipment Exercise tolerated well No report of concerns or symptoms today  Goals Unmet:  Not Applicable  Comments: Pt able to follow exercise prescription today without complaint.  Will continue to monitor for progression.    Dr. Emily Filbert is Medical Director for [place].  Dr. Ottie Glazier is Medical Director for [facility] Pulmonary Rehabilitation.

## 2021-04-23 NOTE — Progress Notes (Signed)
Cardiology Office Note    Date:  04/24/2021   ID:  Terry RayHarvey L Gaby, DOB Jul 31, 1950, MRN [id number]  PCP:  Ileana LaddWong, Francis P, MD  Cardiologist:  Lorine BearsMuhammad Arida, MD  Electrophysiologist:  None   Chief Complaint: Follow-up  History of Present Illness:   Terry Cook is a 71 y.o. male with history of CAD with prior MI in 2013 status post PCI to the LCx with unstable angina in 2015 s/p PCI to the LAD with PCI to the proximal/mid LCx overlapping the previously placed stent in 02/2021, ICM with subsequent improvement in LV systolic function, strong family history of premature coronary artery disease, COVID x2 most recently approximately in 10/2020, PVCs, HTN, HLD, anxiety, BPH, OSA on CPAP, arthritis, and chronic low back pain who presents for follow-up of his CAD.    He was admitted with an MI in 2013.  He underwent PCI to the LCx and was found to have a chronically occluded RCA with left-to-right collaterals at that time.  EF at that time was 40 to 45%.  He presented with unstable angina in 2015 and was found to have a patent LCx stent, though there was 90% stenosis in the mid LAD which was treated successfully with PCI/DES.  He was seen in the office in 04/2020 and was doing reasonably well.  Over the preceding year, he did report 2 brief episodes of chest discomfort.  There were no exertional symptoms with recommendation to continue medical therapy.  Given he had not had an echo since his MI in 2013, he underwent echo in 04/2020 which showed an improvement in his LV systolic function with a low normal EF of 50 to 55%, possible basal inferior wall hypokinesis, mild LVH, grade 1 diastolic dysfunction, normal RV systolic function and ventricular cavity size, moderately dilated left atrium, and no noted significant valvular abnormalities.  He was seen in the office on 01/29/2021 noting intermittent randomly occurring profound fatigue/exhaustion as well as chest pressure. These symptoms felt similar to his  prior angina. Given symptoms, he underwent LHC on 02/12/2021 which demonstrated significant 3-vessel CAD with patent stents in the LAD and LCx. The RCA was chronically occluded with left-to-right collaterals. There was 70% stenosis in the proximal/mid LCx at the proximal edge of the previously placed stent. This was significant by fractional flow reserve evaluation with an iFR ratio of 0.85. There was also moderate disease in the LCx distal to the old stent. This was not significant by fractional flow reserve evaluation after treating the more proximal lesion. The OM1 was small in diameter with severe diffuse disease. Normal LV systolic function and high normal left ventricular end-diastolic pressure. He underwent successful drug-eluting stent placement to the proximal/mid LCx overlapping the previously placed stent.  Post cath, he felt much better.  He was last seen in the office on 02/26/2021 and was doing well from a cardiac perspective.  Given persistently elevated BP he underwent renal artery ultrasound which was negative for RAS bilaterally.  He comes in doing well from a cardiac perspective.  No symptoms suggestive of angina or decompensation.  He is working with cardiac rehab, performing low impact exercises to minimize exacerbation in back pain.  He is tolerating all cardiac medications without adverse effect and has not missed any doses of DAPT.  No falls, hematochezia, or melena.  Blood pressure more recently has been well controlled in the 120s over 70s.  He continues to eat a heart healthy diet and live an active lifestyle.  He will be undergoing a sleep study for titration of CPAP.  He notes stable mild pedal/ankle edema if he has been up on his feet on hard floors for an extended timeframe.   Labs independently reviewed: 02/2020 - albumin 4.4, AST/ALT normal, TC 141, TG 163, HDL 32, LDL 81, direct LDL 82, Hgb 15.9, PLT 136, BUN 9, serum creatinine 1.06, potassium 3.9  Past Medical History:   Diagnosis Date   Anxiety    CAD (coronary artery disease), native coronary artery 06/04/2011   s/p MI with PCI to left circ   Chronic lower back pain    DDD (degenerative disc disease), lumbar    Depression    High cholesterol    History of kidney stones    Hypertension    Irregular heart beats    MI (myocardial infarction) ([place]) 05/2011   OSA on CPAP     Past Surgical History:  Procedure Laterality Date   COLONOSCOPY     CORONARY ANGIOPLASTY WITH STENT PLACEMENT  05/2011   "1"   CORONARY PRESSURE WIRE/FFR WITH 3D MAPPING N/A 02/12/2021   Procedure: Coronary Pressure Wire/FFR w/3D Mapping;  Surgeon: Wellington Hampshire, MD;  Location: [place] [facility];  Service: Cardiovascular;  Laterality: N/A;   CORONARY STENT INTERVENTION N/A 02/12/2021   Procedure: CORONARY STENT INTERVENTION;  Surgeon: Wellington Hampshire, MD;  Location: [place] [facility];  Service: Cardiovascular;  Laterality: N/A;   CYST EXCISION Right ~ 2009   "index finger; in office"   CYSTOSCOPY W/ STONE MANIPULATION  ~ 1975 X 2   LEFT HEART CATH AND CORONARY ANGIOGRAPHY Left 02/12/2021   Procedure: LEFT HEART CATH AND CORONARY ANGIOGRAPHY;  Surgeon: Wellington Hampshire, MD;  Location: [place] [facility];  Service: Cardiovascular;  Laterality: Left;   LEFT HEART CATHETERIZATION WITH CORONARY ANGIOGRAM N/A 06/04/2011   Procedure: LEFT HEART CATHETERIZATION WITH CORONARY ANGIOGRAM;  Surgeon: Jacolyn Reedy, MD;  Location: [facility] CATH LAB;  Service: Cardiovascular;  Laterality: N/A;   LEFT HEART CATHETERIZATION WITH CORONARY ANGIOGRAM N/A 11/18/2013   Procedure: LEFT HEART CATHETERIZATION WITH CORONARY ANGIOGRAM;  Surgeon: Sinclair Grooms, MD;  Location: [facility] CATH LAB;  Service: Cardiovascular;  Laterality: N/A;   PERCUTANEOUS CORONARY STENT INTERVENTION (PCI-S)  11/18/2013   Procedure: PERCUTANEOUS CORONARY STENT INTERVENTION (PCI-S);  Surgeon: Sinclair Grooms, MD;  Location: [facility] CATH LAB;  Service: Cardiovascular;;   SHOULDER  ARTHROSCOPY WITH OPEN ROTATOR CUFF REPAIR Right 11/24/2018   Procedure: SHOULDER ARTHROSCOPY WITH OPEN MINI ROTATOR CUFF REPAIR, SUBACROMIAL DECOMPRESSION, DISTAL CLAVICLE EXCISION, BICEPS TENODESIS;  Surgeon: Thornton Park, MD;  Location: [facility] ORS;  Service: Orthopedics;  Laterality: Right;    Current Medications: Current Meds  Medication Sig   acetaminophen (TYLENOL) 500 MG tablet Take 1,000 mg by mouth every 8 (eight) hours as needed for mild pain or moderate pain.   ALPRAZolam (XANAX) 0.5 MG tablet Take 0.5 mg by mouth 2 (two) times daily as needed for anxiety.   amLODipine (NORVASC) 10 MG tablet Take 1 tablet (10 mg total) by mouth daily.   aspirin EC 81 MG tablet Take 81 mg by mouth daily.   atorvastatin (LIPITOR) 80 MG tablet Take 1 tablet (80 mg total) by mouth every evening.   clopidogrel (PLAVIX) 75 MG tablet Take 1 tablet (75 mg total) by mouth daily.   diclofenac sodium (VOLTAREN) 1 % GEL Apply 1 application topically 3 (three) times daily. Apply to feet   ezetimibe (ZETIA) 10 MG tablet Take 1  tablet (10 mg total) by mouth daily.   hydrOXYzine (VISTARIL) 25 MG capsule Take 50 mg by mouth every 8 (eight) hours as needed.   losartan-hydrochlorothiazide (HYZAAR) 100-25 MG tablet Take 1 tablet by mouth daily.   naloxone (NARCAN) nasal spray 4 mg/0.1 mL SMARTSIG:Both Nares Once a Week   Oxycodone HCl 10 MG TABS Take 10 mg by mouth 3 (three) times daily.   polycarbophil (FIBERCON) 625 MG tablet Take 1,250 mg by mouth daily.   sertraline (ZOLOFT) 25 MG tablet Take 75 mg by mouth daily.   silodosin (RAPAFLO) 8 MG CAPS capsule Take 1 capsule (8 mg total) by mouth daily with breakfast.   zolpidem (AMBIEN) 10 MG tablet Take 10 mg by mouth at bedtime.    Allergies:   Patient has no known allergies.   Social History   Socioeconomic History   Marital status: Married    Spouse name: Not on file   Number of children: Not on file   Years of education: Not on file   Highest education  level: Not on file  Occupational History   Not on file  Tobacco Use   Smoking status: Never   Smokeless tobacco: Never  Vaping Use   Vaping Use: Never used  Substance and Sexual Activity   Alcohol use: Not Currently    Alcohol/week: 2.0 standard drinks    Types: 2 Glasses of wine per week   Drug use: No   Sexual activity: Yes  Other Topics Concern   Not on file  Social History Narrative   Not on file   Social Determinants of Health   Financial Resource Strain: Not on file  Food Insecurity: Not on file  Transportation Needs: Not on file  Physical Activity: Not on file  Stress: Not on file  Social Connections: Not on file     Family History:  The patient's family history includes Coronary artery disease (age of onset: 59) in his father; Coronary artery disease (age of onset: 30) in his brother; Kidney cancer in his brother; Lupus in his daughter; Non-Hodgkin's lymphoma in his mother.  ROS:   Review of Systems  Constitutional:  Negative for chills, diaphoresis, fever, malaise/fatigue and weight loss.  HENT:  Negative for congestion.   Eyes:  Negative for discharge and redness.  Respiratory:  Negative for cough, sputum production, shortness of breath and wheezing.   Cardiovascular:  Negative for chest pain, palpitations, orthopnea, claudication, leg swelling and PND.  Gastrointestinal:  Negative for blood in stool and melena.  Musculoskeletal:  Positive for back pain. Negative for falls and myalgias.  Skin:  Negative for rash.  Neurological:  Negative for dizziness, tingling, tremors, sensory change, speech change, focal weakness, loss of consciousness and weakness.  Endo/Heme/Allergies:  Does not bruise/bleed easily.  Psychiatric/Behavioral:  Negative for substance abuse. The patient is not nervous/anxious.   All other systems reviewed and are negative.   EKGs/Labs/Other Studies Reviewed:    Studies reviewed were summarized above. The additional studies were reviewed  today:  LHC 02/12/2021:   Prox RCA to Mid RCA lesion is 100% stenosed.   Ost RCA to Prox RCA lesion is 50% stenosed.   Mid Cx lesion is 10% stenosed.   Mid Cx to Dist Cx lesion is 50% stenosed.   3rd Mrg lesion is 40% stenosed.   1st Mrg lesion is 90% stenosed.   Prox LAD to Mid LAD lesion is 40% stenosed.   Prox Cx to Mid Cx lesion is 70% stenosed.  Previously placed Mid LAD stent (unknown type) is  widely patent.   A drug-eluting stent was successfully placed using a STENT ONYX FRONTIER 3.0X18.   Post intervention, there is a 0% residual stenosis.   The left ventricular systolic function is normal.   LV end diastolic pressure is normal.   The left ventricular ejection fraction is 55-65% by visual estimate.   1.  Significant underlying three-vessel coronary artery disease with patent stents in the left circumflex and LAD.  Chronically occluded right coronary artery with left-to-right collaterals.  There is 70% stenosis in the proximal/mid left circumflex at the proximal edge of the previously placed stent.  This was significant by fractional flow reserve evaluation with an IFR ratio of 0.85.  There is also moderate disease in the left circumflex distal to the old stent.  This was not significant by fractional flow reserve evaluation after treating the more proximal lesion.  OM1 is small in diameter with severe diffuse disease.  2.  Normal LV systolic function and high normal left ventricular end-diastolic pressure. 3.  Successful drug-eluting stent placement to the proximal/mid left circumflex overlapping the previously placed stent.   Recommendations: Continue dual antiplatelet therapy indefinitely if tolerated given multiple stents. Aggressive treatment of risk factors. __________   2D echo 05/05/2020: 1. Left ventricular ejection fraction, by estimation, is 50 to 55%. The  left ventricle has low normal function. Parasternal short axis images  suggestive of basal inferior wall  hypokineis. There is mild left  ventricular hypertrophy. Left ventricular  diastolic parameters are consistent with Grade I diastolic dysfunction  (impaired relaxation).   2. Right ventricular systolic function is normal. The right ventricular  size is normal.   3. Left atrial size was moderately dilated.   4. The aortic valve is normal in structure.Trileaflet. Aortic valve  regurgitation is not visualized. No aortic stenosis is present.   5. Challenging images.     EKG:  EKG is not ordered today.    Recent Labs: 01/29/2021: TSH 4.910 02/26/2021: ALT 24; BUN 9; Creatinine, Ser 1.06; Hemoglobin 15.9; Platelets 136; Potassium 3.9; Sodium 144  Recent Lipid Panel    Component Value Date/Time   CHOL 141 02/26/2021 0944   TRIG 163 (H) 02/26/2021 0944   HDL 32 (L) 02/26/2021 0944   CHOLHDL 4.4 02/26/2021 0944   CHOLHDL 3.7 11/18/2013 0246   VLDL 38 11/18/2013 0246   LDLCALC 81 02/26/2021 0944   LDLDIRECT 82 02/26/2021 0944    PHYSICAL EXAM:    VS:  BP 122/72 (BP Location: Left Arm, Patient Position: Sitting, Cuff Size: Normal)    Pulse 64    Ht 5' 6.5" (1.689 m)    Wt 192 lb (87.1 kg)    SpO2 95%    BMI 30.53 kg/m   BMI: Body mass index is 30.53 kg/m.  Physical Exam Vitals reviewed.  Constitutional:      Appearance: He is well-developed.  HENT:     Head: Normocephalic and atraumatic.  Eyes:     General:        Right eye: No discharge.        Left eye: No discharge.  Neck:     Vascular: No JVD.  Cardiovascular:     Rate and Rhythm: Normal rate and regular rhythm.     Pulses:          Posterior tibial pulses are 2+ on the right side and 2+ on the left side.     Heart sounds: Normal heart sounds,  S1 normal and S2 normal. Heart sounds not distant. No midsystolic click and no opening snap. No murmur heard.   No friction rub.  Pulmonary:     Effort: Pulmonary effort is normal. No respiratory distress.     Breath sounds: Normal breath sounds. No decreased breath sounds,  wheezing or rales.  Chest:     Chest wall: No tenderness.  Abdominal:     General: There is no distension.     Palpations: Abdomen is soft.     Tenderness: There is no abdominal tenderness.  Musculoskeletal:     Cervical back: Normal range of motion.     Right lower leg: No edema.     Left lower leg: No edema.  Skin:    General: Skin is warm and dry.     Nails: There is no clubbing.  Neurological:     Mental Status: He is alert and oriented to person, place, and time.  Psychiatric:        Speech: Speech normal.        Behavior: Behavior normal.        Thought Content: Thought content normal.        Judgment: Judgment normal.    Wt Readings from Last 3 Encounters:  04/24/21 192 lb (87.1 kg)  04/19/21 191 lb (86.6 kg)  03/07/21 194 lb 9.6 oz (88.3 kg)     ASSESSMENT & PLAN:   CAD involving the native coronary arteries without angina: He is doing well without any symptoms concerning for angina.  Continue DAPT with ASA and clopidogrel, ideally indefinitely, if tolerated,given multiple stents along with atorvastatin, ezetimibe, and losartan.  Aggressive risk factor modification.  No indication for further ischemic testing at this time.  History of HFrEF secondary to ICM with subsequent normalization of LV systolic function: He appears euvolemic and well compensated.  Not currently on beta-blocker secondary to resting sinus bradycardia at baseline.  He remains on losartan.  HTN: Blood pressure is well controlled in the office today.  He remains on amlodipine and losartan/HCTZ.  Continue low-sodium diet.  HLD: LDL 82 in 02/2021 with goal being less than 70.  Now on atorvastatin 80 mg and ezetimibe.  Check LFT, lipid panel, and direct LDL.  PVCs: Quiescent and asymptomatic.  He remains off beta-blocker given resting heart rates typically in the 50s to 60s bpm.  OSA: Followed by sleep medicine division of the cardiovascular service line.  Will be undergoing a sleep  study.   Disposition: F/u with Dr. Fletcher Anon or an APP in 6 months.   Medication Adjustments/Labs and Tests Ordered: Current medicines are reviewed at length with the patient today.  Concerns regarding medicines are outlined above. Medication changes, Labs and Tests ordered today are summarized above and listed in the Patient Instructions accessible in Encounters.   Signed, Christell Faith, PA-C 04/24/2021 12:41 PM     [address] [place], [place] [address] [phone]

## 2021-04-24 ENCOUNTER — Encounter: Payer: Self-pay | Admitting: Physician Assistant

## 2021-04-24 ENCOUNTER — Ambulatory Visit: Payer: Medicare Other | Admitting: Physician Assistant

## 2021-04-24 ENCOUNTER — Other Ambulatory Visit: Payer: Self-pay

## 2021-04-24 VITALS — BP 122/72 | HR 64 | Ht 66.5 in | Wt 192.0 lb

## 2021-04-24 DIAGNOSIS — G4733 Obstructive sleep apnea (adult) (pediatric): Secondary | ICD-10-CM

## 2021-04-24 DIAGNOSIS — E785 Hyperlipidemia, unspecified: Secondary | ICD-10-CM | POA: Diagnosis not present

## 2021-04-24 DIAGNOSIS — I493 Ventricular premature depolarization: Secondary | ICD-10-CM

## 2021-04-24 DIAGNOSIS — I2510 Atherosclerotic heart disease of native coronary artery without angina pectoris: Secondary | ICD-10-CM | POA: Diagnosis not present

## 2021-04-24 DIAGNOSIS — I10 Essential (primary) hypertension: Secondary | ICD-10-CM | POA: Diagnosis not present

## 2021-04-24 DIAGNOSIS — I255 Ischemic cardiomyopathy: Secondary | ICD-10-CM | POA: Diagnosis not present

## 2021-04-24 DIAGNOSIS — Z9989 Dependence on other enabling machines and devices: Secondary | ICD-10-CM

## 2021-04-24 NOTE — Patient Instructions (Signed)
Medication Instructions:  Your physician recommends that you continue on your current medications as directed. Please refer to the Current Medication list given to you today.   *If you need a refill on your cardiac medications before your next appointment, please call your pharmacy*   Lab Work:  Your provider has ordered labs (lipid profile, hepatic function panel, direct LDL) to be drawn before you leave the office.   If you have labs (blood work) drawn today and your tests are completely normal, you will receive your results only by: MyChart Message (if you have MyChart) OR A paper copy in the mail If you have any lab test that is abnormal or we need to change your treatment, we will call you to review the results.   Testing/Procedures: None ordered   Follow-Up: At [facility], you and your health needs are our priority.  As part of our continuing mission to provide you with exceptional heart care, we have created designated Provider Care Teams.  These Care Teams include your primary Cardiologist (physician) and Advanced Practice Providers (APPs -  Physician Assistants and Nurse Practitioners) who all work together to provide you with the care you need, when you need it.  We recommend signing up for the patient portal called "MyChart".  Sign up information is provided on this After Visit Summary.  MyChart is used to connect with patients for Virtual Visits (Telemedicine).  Patients are able to view lab/test results, encounter notes, upcoming appointments, etc.  Non-urgent messages can be sent to your provider as well.   To learn more about what you can do with MyChart, go to [email or web address].    Your next appointment:    Your physician wants you to follow-up in: 6 months.   You will receive a reminder letter in the mail two months in advance. If you don't receive a letter, please call our office to schedule the follow-up appointment.   The format for your next appointment:    In Person  Provider:   You may see Lorine Bears, MD or one of the following Advanced Practice Providers on your designated Care Team:   Nicolasa Ducking, NP Eula Listen, PA-C Cadence Fransico Michael, PA-C  :1}    Other Instructions N/A

## 2021-04-25 DIAGNOSIS — Z955 Presence of coronary angioplasty implant and graft: Secondary | ICD-10-CM | POA: Diagnosis not present

## 2021-04-25 DIAGNOSIS — I208 Other forms of angina pectoris: Secondary | ICD-10-CM | POA: Diagnosis not present

## 2021-04-25 LAB — LIPID PANEL
Chol/HDL Ratio: 3.7 ratio (ref 0.0–5.0)
Cholesterol, Total: 112 mg/dL (ref 100–199)
HDL: 30 mg/dL — ABNORMAL LOW (ref >39)
LDL Chol Calc (NIH): 56 mg/dL (ref 0–99)
Triglycerides: 147 mg/dL (ref 0–149)
VLDL Cholesterol Cal: 26 mg/dL (ref 5–40)

## 2021-04-25 LAB — LDL CHOLESTEROL, DIRECT: LDL Direct: 59 mg/dL (ref 0–99)

## 2021-04-25 LAB — HEPATIC FUNCTION PANEL
ALT: 24 IU/L (ref 0–44)
AST: 27 IU/L (ref 0–40)
Albumin: 4.6 g/dL (ref 3.8–4.8)
Alkaline Phosphatase: 125 IU/L — ABNORMAL HIGH (ref 44–121)
Bilirubin Total: 0.5 mg/dL (ref 0.0–1.2)
Bilirubin, Direct: 0.17 mg/dL (ref 0.00–0.40)
Total Protein: 7.6 g/dL (ref 6.0–8.5)

## 2021-04-25 NOTE — Progress Notes (Signed)
Daily Session Note ° °Patient Details  °Name: Terry Cook °MRN: [id number] °Date of Birth: 11/21/1950 °Referring Provider:   °Flowsheet Row Cardiac Rehab from 03/07/2021 in [facility]  °Referring Provider Arida, Muhammad MD  ° °  ° ° °Encounter Date: 04/25/2021 ° °Check In: ° Session Check In - 04/25/21 0729   ° °  ° Check-In  ° Supervising physician immediately available to respond to emergencies See telemetry face sheet for immediately available ER MD   ° Location [facility]   ° Staff Present Kelly Bollinger, MPA, RN;Joseph Hood, RCP,RRT,BSRT;Amanda Sommer, BA, ACSM CEP, Exercise Physiologist   ° Virtual Visit No   ° Medication changes reported     No   ° Fall or balance concerns reported    No   ° Warm-up and Cool-down Performed on first and last piece of equipment   ° Resistance Training Performed Yes   ° VAD Patient? No   ° PAD/SET Patient? No   °  ° Pain Assessment  ° Currently in Pain? No/denies   ° °  °  ° °  ° ° ° ° ° °Social History  ° °Tobacco Use  °Smoking Status Never  °Smokeless Tobacco Never  ° ° °Goals Met:  °Independence with exercise equipment °Exercise tolerated well °No report of concerns or symptoms today °Strength training completed today ° °Goals Unmet:  °Not Applicable ° °Comments: Pt able to follow exercise prescription today without complaint.  Will continue to monitor for progression. ° ° ° °Dr. Mark Miller is Medical Director for [facility].  °Dr. Fuad Aleskerov is Medical Director for [facility]. °

## 2021-04-27 ENCOUNTER — Encounter: Payer: Medicare Other | Admitting: *Deleted

## 2021-04-27 ENCOUNTER — Other Ambulatory Visit: Payer: Self-pay

## 2021-04-27 DIAGNOSIS — Z955 Presence of coronary angioplasty implant and graft: Secondary | ICD-10-CM | POA: Diagnosis not present

## 2021-04-27 DIAGNOSIS — I208 Other forms of angina pectoris: Secondary | ICD-10-CM | POA: Diagnosis not present

## 2021-04-27 NOTE — Progress Notes (Signed)
Daily Session Note ° °Patient Details  °Name: Terry Cook °MRN: [id number] °Date of Birth: 08/05/1950 °Referring Provider:   °Flowsheet Row Cardiac Rehab from 03/07/2021 in [facility]  °Referring Provider Arida, Muhammad MD  ° °  ° ° °Encounter Date: 04/27/2021 ° °Check In: ° Session Check In - 04/27/21 0813   ° °  ° Check-In  ° Supervising physician immediately available to respond to emergencies See telemetry face sheet for immediately available ER MD   ° Location [facility]   ° Staff Present Jessica Hawkins, MA, RCEP, CCRP, CCET;Joseph Hood, RCP,RRT,BSRT;Susanne Bice, RN, BSN, CCRP   ° Virtual Visit No   ° Medication changes reported     No   ° Fall or balance concerns reported    No   ° Warm-up and Cool-down Performed on first and last piece of equipment   ° Resistance Training Performed Yes   ° VAD Patient? No   ° PAD/SET Patient? No   °  ° Pain Assessment  ° Currently in Pain? No/denies   ° °  °  ° °  ° ° ° ° ° °Social History  ° °Tobacco Use  °Smoking Status Never  °Smokeless Tobacco Never  ° ° °Goals Met:  °Independence with exercise equipment °Exercise tolerated well °No report of concerns or symptoms today ° °Goals Unmet:  °Not Applicable ° °Comments: Pt able to follow exercise prescription today without complaint.  Will continue to monitor for progression. ° ° ° °Dr. Mark Miller is Medical Director for [facility].  °Dr. Fuad Aleskerov is Medical Director for [facility]. °

## 2021-04-30 ENCOUNTER — Encounter: Payer: Medicare Other | Admitting: *Deleted

## 2021-04-30 ENCOUNTER — Other Ambulatory Visit: Payer: Self-pay

## 2021-04-30 DIAGNOSIS — Z955 Presence of coronary angioplasty implant and graft: Secondary | ICD-10-CM

## 2021-04-30 DIAGNOSIS — I208 Other forms of angina pectoris: Secondary | ICD-10-CM | POA: Diagnosis not present

## 2021-04-30 NOTE — Progress Notes (Signed)
Daily Session Note  Patient Details  Name: Terry Cook MRN: [id number] Date of Birth: 1950/05/04 Referring Provider:   Flowsheet Row Cardiac Rehab from 03/07/2021 in [facility]  Referring Provider Kathlyn Sacramento MD       Encounter Date: 04/30/2021  Check In:  Session Check In - 04/30/21 0804       Check-In   Supervising physician immediately available to respond to emergencies See telemetry face sheet for immediately available ER MD    Location [facility]    Staff Present Heath Lark, RN, BSN, CCRP;Joseph [place], RCP,RRT,BSRT;Kelly [place], [place], ACSM CEP, Exercise Physiologist    Virtual Visit No    Medication changes reported     No    Fall or balance concerns reported    No    Warm-up and Cool-down Performed on first and last piece of equipment    Resistance Training Performed Yes    VAD Patient? No    PAD/SET Patient? No      Pain Assessment   Currently in Pain? No/denies                Social History   Tobacco Use  Smoking Status Never  Smokeless Tobacco Never    Goals Met:  Independence with exercise equipment Exercise tolerated well No report of concerns or symptoms today  Goals Unmet:  Not Applicable  Comments: Pt able to follow exercise prescription today without complaint.  Will continue to monitor for progression.    Dr. Emily Filbert is Medical Director for [facility].  Dr. Ottie Glazier is Medical Director for [facility].

## 2021-05-01 ENCOUNTER — Encounter: Payer: Self-pay | Admitting: Cardiology

## 2021-05-02 ENCOUNTER — Encounter: Payer: Self-pay | Admitting: *Deleted

## 2021-05-02 DIAGNOSIS — Z955 Presence of coronary angioplasty implant and graft: Secondary | ICD-10-CM

## 2021-05-02 NOTE — Progress Notes (Signed)
Cardiac Individual Treatment Plan  Patient Details  Name: Terry Cook MRN: [id number] Date of Birth: June 20, 1950 Referring Provider:   Flowsheet Row Cardiac Rehab from 03/07/2021 in [facility] Cardiac and Pulmonary Rehab  Referring Provider Kathlyn Sacramento MD       Initial Encounter Date:  Flowsheet Row Cardiac Rehab from 03/07/2021 in [facility] Cardiac and Pulmonary Rehab  Date 03/07/21       Visit Diagnosis: Status post coronary artery stent placement  Patient's Home Medications on Admission:  Current Outpatient Medications:    acetaminophen (TYLENOL) 500 MG tablet, Take 1,000 mg by mouth every 8 (eight) hours as needed for mild pain or moderate pain., Disp: , Rfl:    ALPRAZolam (XANAX) 0.5 MG tablet, Take 0.5 mg by mouth 2 (two) times daily as needed for anxiety., Disp: , Rfl:    amLODipine (NORVASC) 10 MG tablet, Take 1 tablet (10 mg total) by mouth daily., Disp: 90 tablet, Rfl: 1   aspirin EC 81 MG tablet, Take 81 mg by mouth daily., Disp: , Rfl:    atorvastatin (LIPITOR) 80 MG tablet, Take 1 tablet (80 mg total) by mouth every evening., Disp: 90 tablet, Rfl: 1   clopidogrel (PLAVIX) 75 MG tablet, Take 1 tablet (75 mg total) by mouth daily., Disp: 90 tablet, Rfl: 0   diclofenac sodium (VOLTAREN) 1 % GEL, Apply 1 application topically 3 (three) times daily. Apply to feet, Disp: , Rfl:    ezetimibe (ZETIA) 10 MG tablet, Take 1 tablet (10 mg total) by mouth daily., Disp: 90 tablet, Rfl: 3   hydrOXYzine (VISTARIL) 25 MG capsule, Take 50 mg by mouth every 8 (eight) hours as needed., Disp: , Rfl:    losartan-hydrochlorothiazide (HYZAAR) 100-25 MG tablet, Take 1 tablet by mouth daily., Disp: , Rfl:    naloxone (NARCAN) nasal spray 4 mg/0.1 mL, SMARTSIG:Both Nares Once a Week, Disp: , Rfl:    Oxycodone HCl 10 MG TABS, Take 10 mg by mouth 3 (three) times daily., Disp: , Rfl:    polycarbophil (FIBERCON) 625 MG tablet, Take 1,250 mg by mouth daily., Disp: , Rfl:    sertraline (ZOLOFT) 25 MG  tablet, Take 75 mg by mouth daily., Disp: , Rfl:    silodosin (RAPAFLO) 8 MG CAPS capsule, Take 1 capsule (8 mg total) by mouth daily with breakfast., Disp: 90 capsule, Rfl: 3   zolpidem (AMBIEN) 10 MG tablet, Take 10 mg by mouth at bedtime., Disp: , Rfl:   Past Medical History: Past Medical History:  Diagnosis Date   Anxiety    CAD (coronary artery disease), native coronary artery 06/04/2011   s/p MI with PCI to left circ   Chronic lower back pain    DDD (degenerative disc disease), lumbar    Depression    High cholesterol    History of kidney stones    Hypertension    Irregular heart beats    MI (myocardial infarction) ([place]) 05/2011   OSA on CPAP     Tobacco Use: Social History   Tobacco Use  Smoking Status Never  Smokeless Tobacco Never    Labs: Recent Review Flowsheet Data     Labs for ITP Cardiac and Pulmonary Rehab Latest Ref Rng & Units 06/03/2011 06/04/2011 11/18/2013 02/26/2021 04/24/2021   Cholestrol 100 - 199 mg/dL - 133 139 141 112   LDLCALC 0 - 99 mg/dL - 69 63 81 56   LDLDIRECT 0 - 99 mg/dL - - - 82 59   HDL >39 mg/dL - 35(L) 38(L)  32(L) 30(L)   Trlycerides 0 - 149 mg/dL - 147 190(H) 163(H) 147   TCO2 0 - 100 mmol/L 32 - - - -        Exercise Target Goals: Exercise Program Goal: Individual exercise prescription set using results from initial 6 min walk test and THRR while considering  patients activity barriers and safety.   Exercise Prescription Goal: Initial exercise prescription builds to 30-45 minutes a day of aerobic activity, 2-3 days per week.  Home exercise guidelines will be given to patient during program as part of exercise prescription that the participant will acknowledge.   Education: Aerobic Exercise: - Group verbal and visual presentation on the components of exercise prescription. Introduces F.I.T.T principle from ACSM for exercise prescriptions.  Reviews F.I.T.T. principles of aerobic exercise including progression. Written material  given at graduation.   Education: Resistance Exercise: - Group verbal and visual presentation on the components of exercise prescription. Introduces F.I.T.T principle from ACSM for exercise prescriptions  Reviews F.I.T.T. principles of resistance exercise including progression. Written material given at graduation.    Education: Exercise & Equipment Safety: - Individual verbal instruction and demonstration of equipment use and safety with use of the equipment. Flowsheet Row Cardiac Rehab from 04/25/2021 in [facility] Cardiac and Pulmonary Rehab  Date 03/07/21  Educator [facility]  Instruction Review Code 1- Verbalizes Understanding       Education: Exercise Physiology & General Exercise Guidelines: - Group verbal and written instruction with models to review the exercise physiology of the cardiovascular system and associated critical values. Provides general exercise guidelines with specific guidelines to those with heart or lung disease.  Flowsheet Row Cardiac Rehab from 04/25/2021 in [facility] Cardiac and Pulmonary Rehab  Date 03/28/21  Educator [facility]  Instruction Review Code 1- Verbalizes Understanding       Education: Flexibility, Balance, Mind/Body Relaxation: - Group verbal and visual presentation with interactive activity on the components of exercise prescription. Introduces F.I.T.T principle from ACSM for exercise prescriptions. Reviews F.I.T.T. principles of flexibility and balance exercise training including progression. Also discusses the mind body connection.  Reviews various relaxation techniques to help reduce and manage stress (i.e. Deep breathing, progressive muscle relaxation, and visualization). Balance handout provided to take home. Written material given at graduation.   Activity Barriers & Risk Stratification:  Activity Barriers & Cardiac Risk Stratification - 03/07/21 1317       Activity Barriers & Cardiac Risk Stratification   Activity Barriers Arthritis;Back Problems;Neck/Spine  Problems;Deconditioning;Muscular Weakness;Shortness of Breath;Balance Concerns   cervical stenosis, chronic low back pain with DDD   Cardiac Risk Stratification Moderate             6 Minute Walk:  6 Minute Walk     Row Name 03/07/21 1316         6 Minute Walk   Phase Initial     Distance 1360 feet     Walk Time 6 minutes     # of Rest Breaks 0     MPH 2.58     METS 2.88     RPE 8     VO2 Peak 10.07     Symptoms Yes (comment)     Comments tail bone/back pain 1/10, burning in thighs, SOB     Resting HR 65 bpm     Resting BP 144/86     Resting Oxygen Saturation  96 %     Exercise Oxygen Saturation  during 6 min walk 96 %     Max Ex. HR  90 bpm     Max Ex. BP 158/86              Oxygen Initial Assessment:   Oxygen Re-Evaluation:   Oxygen Discharge (Final Oxygen Re-Evaluation):   Initial Exercise Prescription:  Initial Exercise Prescription - 03/07/21 1300       Date of Initial Exercise RX and Referring Provider   Date 03/07/21    Referring Provider Kathlyn Sacramento MD      Oxygen   Maintain Oxygen Saturation 88% or higher      Treadmill   MPH 2.3    Grade 0.5    Minutes 15    METs 2.76      Recumbant Bike   Level 2    RPM 50    Watts 24    Minutes 15    METs 2.8      REL-XR   Level 2    Speed 50    Minutes 15    METs 2.8      Track   Laps 35    Minutes 15    METs 2.9      Prescription Details   Frequency (times per week) 3    Duration Progress to 30 minutes of continuous aerobic without signs/symptoms of physical distress      Intensity   THRR 40-80% of Max Heartrate 99-133    Ratings of Perceived Exertion 11-13    Perceived Dyspnea 0-4      Progression   Progression Continue to progress workloads to maintain intensity without signs/symptoms of physical distress.      Resistance Training   Training Prescription Yes    Weight 4  lb    Reps 10-15             Perform Capillary Blood Glucose checks as  needed.  Exercise Prescription Changes:   Exercise Prescription Changes     Row Name 03/07/21 1300 03/12/21 1000 03/26/21 1700 04/10/21 1500 04/23/21 1300     Response to Exercise   Blood Pressure (Admit) 144/86 124/56 126/78 102/60 136/90   Blood Pressure (Exercise) 158/86 150/60 -- -- --   Blood Pressure (Exit) 124/58 122/60 128/64 112/60 118/60   Heart Rate (Admit) 65 bpm 75 bpm 80 bpm 74 bpm 93 bpm   Heart Rate (Exercise) 90 bpm 102 bpm 101 bpm 120 bpm 115 bpm   Heart Rate (Exit) 70 bpm 68 bpm 78 bpm 73 bpm 66 bpm   Oxygen Saturation (Admit) 96 % -- -- -- --   Oxygen Saturation (Exercise) 96 % -- -- -- --   Rating of Perceived Exertion (Exercise) 8 12 13 13 13    Symptoms tail bone/back pain 1/10, thighs burning, SOB none none back pain on elliptical second try --   Comments walk test results 1st full day of exercise -- -- --   Duration -- Progress to 30 minutes of  aerobic without signs/symptoms of physical distress Progress to 30 minutes of  aerobic without signs/symptoms of physical distress Continue with 30 min of aerobic exercise without signs/symptoms of physical distress. Continue with 30 min of aerobic exercise without signs/symptoms of physical distress.   Intensity -- THRR unchanged THRR unchanged THRR unchanged THRR unchanged     Progression   Progression -- Continue to progress workloads to maintain intensity without signs/symptoms of physical distress. Continue to progress workloads to maintain intensity without signs/symptoms of physical distress. Continue to progress workloads to maintain intensity without signs/symptoms of physical distress. Continue to progress workloads  to maintain intensity without signs/symptoms of physical distress.   Average METs -- 2.97 5.25 3.78 3.5     Resistance Training   Training Prescription -- Yes Yes Yes Yes   Weight -- 4 lb 6 lb 8 lb 6 lb   Reps -- 10-15 10-15 10-15 10-15     Interval Training   Interval Training -- No No No No      Treadmill   MPH -- 2.5 -- 2.7 --   Grade -- 0.5 -- 1 --   Minutes -- 15 -- 15 --   METs -- 3.09 -- 3.44 --     Recumbant Bike   Level -- 2 -- -- 4   Watts -- 25 -- -- --   Minutes -- 15 -- -- 15   METs -- 2.86 -- -- 2.85     NuStep   Level -- -- 4 4 6    Minutes -- -- 15 15 15    METs -- -- 5.7 5.7 4.1     Arm Ergometer   Level -- -- -- 1.5 --   Minutes -- -- -- 15 --   METs -- -- -- 2.7 --     Elliptical   Level -- -- -- 1 --   Speed -- -- -- 2 --   Minutes -- -- -- 15 --   METs -- -- -- 2.2 --     REL-XR   Level -- -- 4 4 --   Minutes -- -- 15 15 --   METs -- -- 4.8 4.8 --     Oxygen   Maintain Oxygen Saturation -- -- -- 88% or higher 88% or higher            Exercise Comments:   Exercise Comments     Row Name 03/09/21 0808           Exercise Comments First full day of exercise!  Patient was oriented to gym and equipment including functions, settings, policies, and procedures.  Patient's individual exercise prescription and treatment plan were reviewed.  All starting workloads were established based on the results of the 6 minute walk test done at initial orientation visit.  The plan for exercise progression was also introduced and progression will be customized based on patient's performance and goals.                Exercise Goals and Review:   Exercise Goals     Row Name 03/07/21 1338             Exercise Goals   Increase Physical Activity Yes       Intervention Provide advice, education, support and counseling about physical activity/exercise needs.;Develop an individualized exercise prescription for aerobic and resistive training based on initial evaluation findings, risk stratification, comorbidities and participant's personal goals.       Expected Outcomes Short Term: Attend rehab on a regular basis to increase amount of physical activity.;Long Term: Add in home exercise to make exercise part of routine and to increase amount of  physical activity.;Long Term: Exercising regularly at least 3-5 days a week.       Increase Strength and Stamina Yes       Intervention Provide advice, education, support and counseling about physical activity/exercise needs.;Develop an individualized exercise prescription for aerobic and resistive training based on initial evaluation findings, risk stratification, comorbidities and participant's personal goals.       Expected Outcomes Short Term: Increase workloads from initial exercise prescription for resistance,  speed, and METs.;Long Term: Improve cardiorespiratory fitness, muscular endurance and strength as measured by increased METs and functional capacity (6MWT);Short Term: Perform resistance training exercises routinely during rehab and add in resistance training at home       Able to understand and use rate of perceived exertion (RPE) scale Yes       Intervention Provide education and explanation on how to use RPE scale       Expected Outcomes Short Term: Able to use RPE daily in rehab to express subjective intensity level;Long Term:  Able to use RPE to guide intensity level when exercising independently       Able to understand and use Dyspnea scale Yes       Intervention Provide education and explanation on how to use Dyspnea scale       Expected Outcomes Short Term: Able to use Dyspnea scale daily in rehab to express subjective sense of shortness of breath during exertion;Long Term: Able to use Dyspnea scale to guide intensity level when exercising independently       Knowledge and understanding of Target Heart Rate Range (THRR) Yes       Intervention Provide education and explanation of THRR including how the numbers were predicted and where they are located for reference       Expected Outcomes Short Term: Able to state/look up THRR;Long Term: Able to use THRR to govern intensity when exercising independently;Short Term: Able to use daily as guideline for intensity in rehab       Able to  check pulse independently Yes       Intervention Provide education and demonstration on how to check pulse in carotid and radial arteries.;Review the importance of being able to check your own pulse for safety during independent exercise       Expected Outcomes Short Term: Able to explain why pulse checking is important during independent exercise;Long Term: Able to check pulse independently and accurately       Understanding of Exercise Prescription Yes       Intervention Provide education, explanation, and written materials on patient's individual exercise prescription       Expected Outcomes Short Term: Able to explain program exercise prescription;Long Term: Able to explain home exercise prescription to exercise independently                Exercise Goals Re-Evaluation :  Exercise Goals Re-Evaluation     Row Name 03/09/21 0809 03/12/21 1042 03/14/21 0747 03/26/21 1736 04/10/21 1540     Exercise Goal Re-Evaluation   Exercise Goals Review Able to understand and use rate of perceived exertion (RPE) scale;Knowledge and understanding of Target Heart Rate Range (THRR);Understanding of Exercise Prescription;Able to understand and use Dyspnea scale Increase Physical Activity;Increase Strength and Stamina Increase Physical Activity;Increase Strength and Stamina Increase Physical Activity;Increase Strength and Stamina Increase Physical Activity;Increase Strength and Stamina;Understanding of Exercise Prescription   Comments Reviewed RPE and dyspnea scales, THR and program prescription with pt today.  Pt voiced understanding and was given a copy of goals to take home. Davell did well his first full session of exercise at rehab. He tolerated his exercise prescription well and even increased to 2.5 speed on the treadmill. His RPEs were appropriate. Will continue to monitor as he progresses through the program. Reviewed home exercise with pt today.  Pt plans to use gym where he lives for exercise.  Reviewed  THR, pulse, RPE, sign and symptoms, pulse oximetery and when to call 911 or MD.  Also discussed weather considerations and indoor options.  Pt voiced understanding. Ebrima is progressing well and has increase average MET level to 5.25.  he has also increased to 6 lb for strength work. Satish is doing well in rehab.   He is now up to 8 lb hand weights and has tried out the elliptical some along with arm crank.  We will continue to monitor his progress.   Expected Outcomes Short: Use RPE daily to regulate intensity. Long: Follow program prescription in THR. Short: Continue current exercise prescription and increase when appropriate Long: Gradually increase overall MET level Short: monitor HR when exercising on his own Long: maintain exercise independently Short: continue to attend consistently Long: continue to build stamina Short: Continue to increase stamina on elliptical Long: Continue to improve stamina.    [place] Name 04/11/21 0731 04/23/21 1340           Exercise Goal Re-Evaluation   Exercise Goals Review Increase Physical Activity;Increase Strength and Stamina;Understanding of Exercise Prescription Increase Physical Activity;Increase Strength and Stamina      Comments Rondrick is doing well in rehab.  His back continues to be a limitation.  He has a pinched nerve and see the doctor again next week for it.  This has been going on for about 35 years. He stays active at work at least 3 days a week.  At work he lifts and moves boxes frequently. He has not really been doing much cardio. Lorance has been doing seated machines and not walking to help his back pain resolve some.  Hopefuly this will get better and he can continue to exercise. We will continue to monitor.      Expected Outcomes Short: Talk to doctor about back Long: conitnue to improve stamina. Short: try walking when back pain is better Long: exercise with less back pain               Discharge Exercise Prescription (Final Exercise  Prescription Changes):  Exercise Prescription Changes - 04/23/21 1300       Response to Exercise   Blood Pressure (Admit) 136/90    Blood Pressure (Exit) 118/60    Heart Rate (Admit) 93 bpm    Heart Rate (Exercise) 115 bpm    Heart Rate (Exit) 66 bpm    Rating of Perceived Exertion (Exercise) 13    Duration Continue with 30 min of aerobic exercise without signs/symptoms of physical distress.    Intensity THRR unchanged      Progression   Progression Continue to progress workloads to maintain intensity without signs/symptoms of physical distress.    Average METs 3.5      Resistance Training   Training Prescription Yes    Weight 6 lb    Reps 10-15      Interval Training   Interval Training No      Recumbant Bike   Level 4    Minutes 15    METs 2.85      NuStep   Level 6    Minutes 15    METs 4.1      Oxygen   Maintain Oxygen Saturation 88% or higher             Nutrition:  Target Goals: Understanding of nutrition guidelines, daily intake of sodium <1555m, cholesterol <2020m calories 30% from fat and 7% or less from saturated fats, daily to have 5 or more servings of fruits and vegetables.  Education: All About Nutrition: -Group instruction provided by verbal, written material,  interactive activities, discussions, models, and posters to present general guidelines for heart healthy nutrition including fat, fiber, MyPlate, the role of sodium in heart healthy nutrition, utilization of the nutrition label, and utilization of this knowledge for meal planning. Follow up email sent as well. Written material given at graduation. Flowsheet Row Cardiac Rehab from 04/25/2021 in [facility] Cardiac and Pulmonary Rehab  Education need identified 03/07/21       Biometrics:  Pre Biometrics - 03/07/21 1339       Pre Biometrics   Height 5' 6.5" (1.689 m)    Weight 194 lb 9.6 oz (88.3 kg)    BMI (Calculated) 30.94    Single Leg Stand 4.8 seconds              Nutrition  Therapy Plan and Nutrition Goals:  Nutrition Therapy & Goals - 03/20/21 0728       Nutrition Therapy   Diet Heart Healthy Low Na    Drug/Food Interactions Statins/Certain Fruits    Protein (specify units) 70g    Fiber 30 grams    Whole Grain Foods 3 servings    Saturated Fats 12 max. grams    Fruits and Vegetables 8 servings/day    Sodium 1.5 grams      Personal Nutrition Goals   Nutrition Goal ST: Discussed introducing more vegetables instead of some of the servings of fruit, introduce one new vegetable per week, and pairing some fruit servings with protein and fat to better balance snacks (yogurt with fruit, nuts/seeds or nut/seed butter with fruit) LT: increase variety, reduce overal fruit servings by at least 2, follow MyPlate guidelines for meals and the fiber, protein, fat combination for snacks.    Comments 71 y.o. M admitted to rehab s/p stent placement also presents with HTN, CAD, HLD. PMH includes previous MI. Relevant medications include lipitor, xanax, zoloft, ambien, fibercon, oxycodone. PYP 69. He repots having many heart healthy education sessions in his life as well as reading articles and feels he does not need more nutrition education at this time. He has no "junk" or fried foods, very little red meat, he uses olive oil mixed with lemon juice and some spices. He eats about 4 bananas, 4 oranges, and 3 apples per day. He eats broccoli and stringbeans - he feels he should get more, 1 baked potato per week. He reports not picking up a salt shaker for 40 years.  He will have yogurt with fresh blueberries during the day to help with hunger. B: scrambled egg (with some olive oil based margarine) 2x/week or fruit L: [place] sandwich with mustard or peanut butter sandwich (whole wheat). D: sometimes bowl of cereal with low fat milk, Last night he had salmon with no butter and spices as well as a salad with olive oil and vinegar. Drinks: water 5-6 glasses. His constipation is alliviated by the  fibercon he takes daily. Reviewed general heart healthy eating, balance in meals, and provided nutrition handout for him to review. Discussed introducing more vegetables instead of some of the servings of fruit, increasing variety, and pairing some fruit servings with protein and fat to better balance snacks (yogurt with fruit, nuts/seeds or nut/seed butter with fruit)      Intervention Plan   Intervention Prescribe, educate and counsel regarding individualized specific dietary modifications aiming towards targeted core components such as weight, hypertension, lipid management, diabetes, heart failure and other comorbidities.    Expected Outcomes Short Term Goal: Understand basic principles of dietary content, such as calories,  fat, sodium, cholesterol and nutrients.;Short Term Goal: A plan has been developed with personal nutrition goals set during dietitian appointment.;Long Term Goal: Adherence to prescribed nutrition plan.             Nutrition Assessments:  MEDIFICTS Score Key: ?70 Need to make dietary changes  40-70 Heart Healthy Diet ? 40 Therapeutic Level Cholesterol Diet  Flowsheet Row Cardiac Rehab from 03/07/2021 in [facility] Cardiac and Pulmonary Rehab  Picture Your Plate Total Score on Admission 69      Picture Your Plate Scores: <94 Unhealthy dietary pattern with much room for improvement. 41-50 Dietary pattern unlikely to meet recommendations for good health and room for improvement. 51-60 More healthful dietary pattern, with some room for improvement.  >60 Healthy dietary pattern, although there may be some specific behaviors that could be improved.    Nutrition Goals Re-Evaluation:  Nutrition Goals Re-Evaluation     [place] Name 03/14/21 0724 04/11/21 0737           Goals   Nutrition Goal -- ST: Discussed introducing more vegetables instead of some of the servings of fruit, introduce one new vegetable per week, and pairing some fruit servings with protein and fat to  better balance snacks (yogurt with fruit, nuts/seeds or nut/seed butter with fruit) LT: increase variety, reduce overal fruit servings by at least 2, follow MyPlate guidelines for meals and the fiber, protein, fat combination for snacks.      Comment Nutrition appointment scheduled for 12/12 after class. Jered is doing well with his diet.  He says he "eats like a squirrel"!  He has lots of fruits.  He stays away from red meat and limits himself to 3 eggs a week.  He is doing well with variety.  He is also trying to get fish at least once a week. He is also getting baked chicken.  He aims to get is some vegetables.      Expected Outcome -- Short: Continue with good variety and ensure enough protein Long: Continue to aim for heart healthy diet.               Nutrition Goals Discharge (Final Nutrition Goals Re-Evaluation):  Nutrition Goals Re-Evaluation - 04/11/21 0737       Goals   Nutrition Goal ST: Discussed introducing more vegetables instead of some of the servings of fruit, introduce one new vegetable per week, and pairing some fruit servings with protein and fat to better balance snacks (yogurt with fruit, nuts/seeds or nut/seed butter with fruit) LT: increase variety, reduce overal fruit servings by at least 2, follow MyPlate guidelines for meals and the fiber, protein, fat combination for snacks.    Comment Nalin is doing well with his diet.  He says he "eats like a squirrel"!  He has lots of fruits.  He stays away from red meat and limits himself to 3 eggs a week.  He is doing well with variety.  He is also trying to get fish at least once a week. He is also getting baked chicken.  He aims to get is some vegetables.    Expected Outcome Short: Continue with good variety and ensure enough protein Long: Continue to aim for heart healthy diet.             Psychosocial: Target Goals: Acknowledge presence or absence of significant depression and/or stress, maximize coping skills, provide  positive support system. Participant is able to verbalize types and ability to use techniques and skills needed for reducing  stress and depression.   Education: Stress, Anxiety, and Depression - Group verbal and visual presentation to define topics covered.  Reviews how body is impacted by stress, anxiety, and depression.  Also discusses healthy ways to reduce stress and to treat/manage anxiety and depression.  Written material given at graduation. Flowsheet Row Cardiac Rehab from 04/25/2021 in [facility] Cardiac and Pulmonary Rehab  Date 03/21/21  Educator [facility]  Instruction Review Code 1- Verbalizes Understanding       Education: Sleep Hygiene -Provides group verbal and written instruction about how sleep can affect your health.  Define sleep hygiene, discuss sleep cycles and impact of sleep habits. Review good sleep hygiene tips.    Initial Review & Psychosocial Screening:  Initial Psych Review & Screening - 02/16/21 1010       Initial Review   Current issues with Current Psychotropic Meds;Current Stress Concerns    Source of Stress Concerns Financial    Comments Retired      [place]? Yes   Wife , son and his wife  very close     Barriers   Psychosocial barriers to participate in program There are no identifiable barriers or psychosocial needs.;The patient should benefit from training in stress management and relaxation.      Screening Interventions   Interventions Encouraged to exercise;To provide support and resources with identified psychosocial needs;Provide feedback about the scores to participant    Expected Outcomes Short Term goal: Utilizing psychosocial counselor, staff and physician to assist with identification of specific Stressors or current issues interfering with healing process. Setting desired goal for each stressor or current issue identified.;Long Term Goal: Stressors or current issues are controlled or eliminated.;Short Term goal:  Identification and review with participant of any Quality of Life or Depression concerns found by scoring the questionnaire.;Long Term goal: The participant improves quality of Life and PHQ9 Scores as seen by post scores and/or verbalization of changes             Quality of Life Scores:   Quality of Life - 03/07/21 1339       Quality of Life   Select Quality of Life      Quality of Life Scores   Health/Function Pre 14.07 %    Socioeconomic Pre 13.56 %    Psych/Spiritual Pre 12.43 %    Family Pre 18.7 %    GLOBAL Pre 14.29 %            Scores of 19 and below usually indicate a poorer quality of life in these areas.  A difference of  2-3 points is a clinically meaningful difference.  A difference of 2-3 points in the total score of the Quality of Life Index has been associated with significant improvement in overall quality of life, self-image, physical symptoms, and general health in studies assessing change in quality of life.  PHQ-9: Recent Review Flowsheet Data     Depression screen [facility] 2/9 04/04/2021 03/07/2021 03/14/2014 12/15/2013   Decreased Interest 1 1 0 0   Down, Depressed, Hopeless 1 1 1  0   PHQ - 2 Score 2 2 1  0   Altered sleeping 0 2 - -   Tired, decreased energy 1 2 - -   Change in appetite 0 0 - -   Feeling bad or failure about yourself  2 1 - -   Trouble concentrating 0 0 - -   Moving slowly or fidgety/restless 0 0 - -   Suicidal  thoughts 0 0 - -   PHQ-9 Score 5 7 - -   Difficult doing work/chores Not difficult at all Not difficult at all - -      Interpretation of Total Score  Total Score Depression Severity:  1-4 = Minimal depression, 5-9 = Mild depression, 10-14 = Moderate depression, 15-19 = Moderately severe depression, 20-27 = Severe depression   Psychosocial Evaluation and Intervention:  Psychosocial Evaluation - 02/16/21 1028       Psychosocial Evaluation & Interventions   Interventions Encouraged to exercise with the program and follow  exercise prescription    Comments Nealy has no barriers to attending the program. He lives with his wife of 39 years. Their son and daughter in law live close by and they talk with or see them several times a week. He does work some and enjoys working as it keeps him moving. He has completed Cardiac Rehab in the past and is looking forward to doing as much as he can to provide him guidance with his exercise. He has made several nutrition changes and is ready to review this with the RD. He does have some stress,financial,and some mild depression. He is on medication prescribed by his physician.  He does have some back problems and will let staff know if the exercise regimen increases his pain.  He shoudl do well with the program.    Expected Outcomes STG: Attending all scheduled sessions. Keeping stress and depression controlled. Learning and managing exercise/nutriton goals  LTG: Mickey will be able to continue progress he gained in the program    Continue Psychosocial Services  Follow up required by staff             Psychosocial Re-Evaluation:  Psychosocial Re-Evaluation     [place] Name 03/14/21 0725 04/11/21 0734           Psychosocial Re-Evaluation   Current issues with Current Stress Concerns;Current Sleep Concerns Current Stress Concerns;Current Sleep Concerns      Comments Davion says he has high stress.  He exercises to help and works part time.  He also reads.  He has taken courses on mindfulness and uses meditation to lower BP.  He wears a CPAP and takes a medication to help with sleep. [place] is doing well mentally.  He is good in class and works hard.  He works three nights a week lifting, walking, and moving things.  His wife kept him up last night as she was having nightmares.  Normally, he sleeps well and is compliant with his CPAP.  We updated his PHQ last week and his score is improving as he is getting to feel better.  He was able to facetime with his grandkids over weekend and  really enjoys that connection.      Expected Outcomes Short:  continue to use all tools he has to help manage stress Long: manage stress long term Short: Continue to cope with sleep habits.  Long: Conitnue to focus on the good things.      Interventions Stress management education;Relaxation education;Encouraged to attend Cardiac Rehabilitation for the exercise Stress management education;Relaxation education;Encouraged to attend Cardiac Rehabilitation for the exercise      Continue Psychosocial Services  -- Follow up required by staff               Psychosocial Discharge (Final Psychosocial Re-Evaluation):  Psychosocial Re-Evaluation - 04/11/21 0734       Psychosocial Re-Evaluation   Current issues with Current Stress Concerns;Current Sleep Concerns  Comments Shiven is doing well mentally.  He is good in class and works hard.  He works three nights a week lifting, walking, and moving things.  His wife kept him up last night as she was having nightmares.  Normally, he sleeps well and is compliant with his CPAP.  We updated his PHQ last week and his score is improving as he is getting to feel better.  He was able to facetime with his grandkids over weekend and really enjoys that connection.    Expected Outcomes Short: Continue to cope with sleep habits.  Long: Conitnue to focus on the good things.    Interventions Stress management education;Relaxation education;Encouraged to attend Cardiac Rehabilitation for the exercise    Continue Psychosocial Services  Follow up required by staff             Vocational Rehabilitation: Provide vocational rehab assistance to qualifying candidates.   Vocational Rehab Evaluation & Intervention:  Vocational Rehab - 02/16/21 1016       Initial Vocational Rehab Evaluation & Intervention   Assessment shows need for Vocational Rehabilitation No      Discharge Vocational Rehab   Discharge Vocational Rehabilitation retired              Education: Education Goals: Education classes will be provided on a variety of topics geared toward better understanding of heart health and risk factor modification. Participant will state understanding/return demonstration of topics presented as noted by education test scores.  Learning Barriers/Preferences:   General Cardiac Education Topics:  AED/CPR: - Group verbal and written instruction with the use of models to demonstrate the basic use of the AED with the basic ABC's of resuscitation.   Anatomy and Cardiac Procedures: - Group verbal and visual presentation and models provide information about basic cardiac anatomy and function. Reviews the testing methods done to diagnose heart disease and the outcomes of the test results. Describes the treatment choices: Medical Management, Angioplasty, or Coronary Bypass Surgery for treating various heart conditions including Myocardial Infarction, Angina, Valve Disease, and Cardiac Arrhythmias.  Written material given at graduation.   Medication Safety: - Group verbal and visual instruction to review commonly prescribed medications for heart and lung disease. Reviews the medication, class of the drug, and side effects. Includes the steps to properly store meds and maintain the prescription regimen.  Written material given at graduation. Flowsheet Row Cardiac Rehab from [facility]/25/2021 in [facility] Cardiac and Pulmonary Rehab  Date 04/25/21  Educator SB  Instruction Review Code 1- Verbalizes Understanding       Intimacy: - Group verbal instruction through game format to discuss how heart and lung disease can affect sexual intimacy. Written material given at graduation..   Know Your Numbers and Heart Failure: - Group verbal and visual instruction to discuss disease risk factors for cardiac and pulmonary disease and treatment options.  Reviews associated critical values for Overweight/Obesity, Hypertension, Cholesterol, and Diabetes.  Discusses  basics of heart failure: signs/symptoms and treatments.  Introduces Heart Failure Zone chart for action plan for heart failure.  Written material given at graduation.   Infection Prevention: - Provides verbal and written material to individual with discussion of infection control including proper hand washing and proper equipment cleaning during exercise session. Flowsheet Row Cardiac Rehab from 04/25/2021 in [facility] Cardiac and Pulmonary Rehab  Date 03/07/21  Educator [facility]  Instruction Review Code 1- Verbalizes Understanding       Falls Prevention: - Provides verbal and written material to individual with discussion of  falls prevention and safety. Flowsheet Row Cardiac Rehab from 04/25/2021 in [facility] Cardiac and Pulmonary Rehab  Date 02/16/21  Educator SB  Instruction Review Code 1- Verbalizes Understanding       Other: -Provides group and verbal instruction on various topics (see comments)   Knowledge Questionnaire Score:  Knowledge Questionnaire Score - 03/07/21 1340       Knowledge Questionnaire Score   Pre Score 25/26             Core Components/Risk Factors/Patient Goals at Admission:  Personal Goals and Risk Factors at Admission - 03/07/21 1340       Core Components/Risk Factors/Patient Goals on Admission    Weight Management Yes;Obesity;Weight Loss    Intervention Weight Management: Develop a combined nutrition and exercise program designed to reach desired caloric intake, while maintaining appropriate intake of nutrient and fiber, sodium and fats, and appropriate energy expenditure required for the weight goal.;Obesity: Provide education and appropriate resources to help participant work on and attain dietary goals.;Weight Management: Provide education and appropriate resources to help participant work on and attain dietary goals.;Weight Management/Obesity: Establish reasonable short term and long term weight goals.    Admit Weight 194 lb 9.6 oz (88.3 kg)    Goal  Weight: Short Term 190 lb (86.2 kg)    Goal Weight: Long Term 180 lb (81.6 kg)    Expected Outcomes Short Term: Continue to assess and modify interventions until short term weight is achieved;Long Term: Adherence to nutrition and physical activity/exercise program aimed toward attainment of established weight goal;Weight Loss: Understanding of general recommendations for a balanced deficit meal plan, which promotes 1-2 lb weight loss per week and includes a negative energy balance of [phone] kcal/d;Understanding recommendations for meals to include 15-35% energy as protein, 25-35% energy from fat, 35-60% energy from carbohydrates, less than 211m of dietary cholesterol, 20-35 gm of total fiber daily;Understanding of distribution of calorie intake throughout the day with the consumption of 4-5 meals/snacks    Hypertension Yes    Intervention Provide education on lifestyle modifcations including regular physical activity/exercise, weight management, moderate sodium restriction and increased consumption of fresh fruit, vegetables, and low fat dairy, alcohol moderation, and smoking cessation.;Monitor prescription use compliance.    Expected Outcomes Short Term: Continued assessment and intervention until BP is < 140/918mHG in hypertensive participants. < 130/8064mG in hypertensive participants with diabetes, heart failure or chronic kidney disease.;Long Term: Maintenance of blood pressure at goal levels.    Lipids Yes    Intervention Provide education and support for participant on nutrition & aerobic/resistive exercise along with prescribed medications to achieve LDL <39m27mDL >40mg1m Expected Outcomes Short Term: Participant states understanding of desired cholesterol values and is compliant with medications prescribed. Participant is following exercise prescription and nutrition guidelines.;Long Term: Cholesterol controlled with medications as prescribed, with individualized exercise RX and with  personalized nutrition plan. Value goals: LDL < 39mg,81m > 40 mg.             Education:Diabetes - Individual verbal and written instruction to review signs/symptoms of diabetes, desired ranges of glucose level fasting, after meals and with exercise. Acknowledge that pre and post exercise glucose checks will be done for 3 sessions at entry of program.   Core Components/Risk Factors/Patient Goals Review:   Goals and Risk Factor Review     Row Name 03/14/21 0723 04/11/21 0739           Core Components/Risk Factors/Patient Goals Review   Personal  Goals Review Hypertension Weight Management/Obesity;Hypertension;Lipids      Review Domingo reports taking all meds as directed.  He has started checking BP at home.  ususaly it runs high 140s - 80s.  He is having a kidney blood flow test.  BP 126/64 resting today. Shemuel has been doing well in rehab.  His weight is staying steady.  He would like it to go down more, but his back continues to limit him.  His blood pressures are doing well now.  He is feeling better. They did a sonogram of his kidneys and blood flow was good.  He is getting better readings in class so that seems to have helped.  We encouraged him to bring in his cuff to compare. He has talked to doctor about it and they mentioned that home cuffs do tend to read higher in general.      Expected Outcomes Short; follow up with tests  Long: continue to monitor BP Short: Continue to keep close eye on blood pressure Long: Continue to monitor risk factors               Core Components/Risk Factors/Patient Goals at Discharge (Final Review):   Goals and Risk Factor Review - 04/11/21 0739       Core Components/Risk Factors/Patient Goals Review   Personal Goals Review Weight Management/Obesity;Hypertension;Lipids    Review Odarius has been doing well in rehab.  His weight is staying steady.  He would like it to go down more, but his back continues to limit him.  His blood pressures are  doing well now.  He is feeling better. They did a sonogram of his kidneys and blood flow was good.  He is getting better readings in class so that seems to have helped.  We encouraged him to bring in his cuff to compare. He has talked to doctor about it and they mentioned that home cuffs do tend to read higher in general.    Expected Outcomes Short: Continue to keep close eye on blood pressure Long: Continue to monitor risk factors             ITP Comments:  ITP Comments     Row Name 02/16/21 1023 03/07/21 1315 03/09/21 0808 03/20/21 0804 04/04/21 0707   ITP Comments Virtual orientation call completed today. he has an appointment on Date: 03/05/2021  for EP eval and gym Orientation.  Documentation of diagnosis can be found in [facility] Date: 02/12/21 . Completed 6MWT and gym orientation. Initial ITP created and sent for review to Dr. Emily Filbert, Medical Director. First full day of exercise!  Patient was oriented to gym and equipment including functions, settings, policies, and procedures.  Patient's individual exercise prescription and treatment plan were reviewed.  All starting workloads were established based on the results of the 6 minute walk test done at initial orientation visit.  The plan for exercise progression was also introduced and progression will be customized based on patient's performance and goals. Completed initial RD consultation 30 Day review completed. Medical Director ITP review done, changes made as directed, and signed approval by Medical Director.    [place] Name 05/02/21 0841           ITP Comments 30 Day review completed. Medical Director ITP review done, changes made as directed, and signed approval by Medical Director.                Comments:

## 2021-05-03 ENCOUNTER — Telehealth: Payer: Self-pay | Admitting: *Deleted

## 2021-05-03 NOTE — Telephone Encounter (Signed)
Prior Authorization for SPLIT NIGHT sent to [facility] via web portal. Tracking Number .   do not require Pre-Authorization by [employer].  READY TO SCHEDULE

## 2021-05-04 ENCOUNTER — Other Ambulatory Visit: Payer: Self-pay

## 2021-05-04 ENCOUNTER — Encounter: Payer: Medicare Other | Admitting: *Deleted

## 2021-05-04 DIAGNOSIS — I208 Other forms of angina pectoris: Secondary | ICD-10-CM | POA: Diagnosis not present

## 2021-05-04 DIAGNOSIS — Z955 Presence of coronary angioplasty implant and graft: Secondary | ICD-10-CM

## 2021-05-04 NOTE — Progress Notes (Signed)
Daily Session Note  Patient Details  Name: Terry Cook MRN: [id number] Date of Birth: 05-10-1950 Referring Provider:   Flowsheet Row Cardiac Rehab from 03/07/2021 in [facility] Cardiac and Pulmonary Rehab  Referring Provider Kathlyn Sacramento MD       Encounter Date: 05/04/2021  Check In:  Session Check In - 05/04/21 0728       Check-In   Supervising physician immediately available to respond to emergencies See telemetry face sheet for immediately available ER MD    Location [facility]-Cardiac & Pulmonary Rehab    Staff Present Heath Lark, RN, BSN, CCRP;Joseph [place], RCP,RRT,BSRT;Melissa [place], [place], LDN    Virtual Visit No    Medication changes reported     No    Fall or balance concerns reported    No    Warm-up and Cool-down Performed on first and last piece of equipment    Resistance Training Performed Yes    VAD Patient? No    PAD/SET Patient? No      Pain Assessment   Currently in Pain? No/denies                Social History   Tobacco Use  Smoking Status Never  Smokeless Tobacco Never    Goals Met:  Independence with exercise equipment Exercise tolerated well No report of concerns or symptoms today  Goals Unmet:  Not Applicable  Comments: Pt able to follow exercise prescription today without complaint.  Will continue to monitor for progression.    Dr. Emily Filbert is Medical Director for [place].  Dr. Ottie Glazier is Medical Director for [facility] Pulmonary Rehabilitation.

## 2021-05-07 ENCOUNTER — Other Ambulatory Visit: Payer: Self-pay | Admitting: Cardiovascular Disease

## 2021-05-07 ENCOUNTER — Encounter: Payer: Medicare Other | Admitting: *Deleted

## 2021-05-07 ENCOUNTER — Other Ambulatory Visit: Payer: Self-pay

## 2021-05-07 DIAGNOSIS — I208 Other forms of angina pectoris: Secondary | ICD-10-CM | POA: Diagnosis not present

## 2021-05-07 DIAGNOSIS — Z955 Presence of coronary angioplasty implant and graft: Secondary | ICD-10-CM | POA: Diagnosis not present

## 2021-05-07 NOTE — Progress Notes (Signed)
Daily Session Note  Patient Details  Name: Terry Cook MRN: [id number] Date of Birth: [birth year] Referring Provider:   Flowsheet Row Cardiac Rehab from 03/07/2021 in [facility] Cardiac and Pulmonary Rehab  Referring Provider Kathlyn Sacramento MD       Encounter Date: 05/07/2021  Check In:  Session Check In - 05/07/21 0817       Check-In   Supervising physician immediately available to respond to emergencies See telemetry face sheet for immediately available ER MD    Location [facility]-Cardiac & Pulmonary Rehab    Staff Present Heath Lark, RN, BSN, CCRP;Joseph [place], RCP,RRT,BSRT;Laureen [place], BS, RRT, CPFT;Kelly Amedeo Plenty, BS, ACSM CEP, Exercise Physiologist    Virtual Visit No    Medication changes reported     No    Fall or balance concerns reported    No    Warm-up and Cool-down Performed on first and last piece of equipment    Resistance Training Performed Yes    VAD Patient? No    PAD/SET Patient? No      Pain Assessment   Currently in Pain? No/denies                Social History   Tobacco Use  Smoking Status Never  Smokeless Tobacco Never    Goals Met:  Independence with exercise equipment Exercise tolerated well No report of concerns or symptoms today  Goals Unmet:  Not Applicable  Comments: Pt able to follow exercise prescription today without complaint.  Will continue to monitor for progression.    Dr. Emily Filbert is Medical Director for [place].  Dr. Ottie Glazier is Medical Director for [facility] Pulmonary Rehabilitation.

## 2021-05-08 MED ORDER — CLOPIDOGREL BISULFATE 75 MG PO TABS: 75.0000 mg | ORAL_TABLET | Freq: Every day | ORAL | 3 refills | Status: DC

## 2021-05-09 ENCOUNTER — Encounter: Payer: Medicare Other | Attending: Cardiovascular Disease

## 2021-05-09 ENCOUNTER — Other Ambulatory Visit: Payer: Self-pay

## 2021-05-09 DIAGNOSIS — Z955 Presence of coronary angioplasty implant and graft: Secondary | ICD-10-CM

## 2021-05-09 NOTE — Progress Notes (Signed)
Daily Session Note  Patient Details  Name: Terry Cook MRN: [id number] Date of Birth: Oct 30, 1950 Referring Provider:   Flowsheet Row Cardiac Rehab from 03/07/2021 in [facility] Cardiac and Pulmonary Rehab  Referring Provider Kathlyn Sacramento MD       Encounter Date: 05/09/2021  Check In:  Session Check In - 05/09/21 0716       Check-In   Supervising physician immediately available to respond to emergencies See telemetry face sheet for immediately available ER MD    Location [facility]-Cardiac & Pulmonary Rehab    Staff Present Birdie Sons, MPA, RN;Joseph Tessie Fass, RCP,RRT,BSRT;Amanda Oletta Darter, BA, ACSM CEP, Exercise Physiologist    Virtual Visit No    Medication changes reported     No    Fall or balance concerns reported    No    Warm-up and Cool-down Performed on first and last piece of equipment    Resistance Training Performed Yes    VAD Patient? No    PAD/SET Patient? No      Pain Assessment   Currently in Pain? No/denies                Social History   Tobacco Use  Smoking Status Never  Smokeless Tobacco Never    Goals Met:  Independence with exercise equipment Exercise tolerated well No report of concerns or symptoms today Strength training completed today  Goals Unmet:  Not Applicable  Comments: Pt able to follow exercise prescription today without complaint.  Will continue to monitor for progression.    Dr. Emily Filbert is Medical Director for [place].  Dr. Ottie Glazier is Medical Director for [facility] Pulmonary Rehabilitation.

## 2021-05-09 NOTE — Telephone Encounter (Signed)
° °  Pt is calling to schedule sleep study

## 2021-05-10 NOTE — Telephone Encounter (Signed)
Called patient to inform him I will get him scheduled and call him back with his date. Pt was agreeable to treatment.

## 2021-05-14 ENCOUNTER — Other Ambulatory Visit: Payer: Self-pay

## 2021-05-14 ENCOUNTER — Encounter: Payer: Medicare Other | Admitting: *Deleted

## 2021-05-14 DIAGNOSIS — Z955 Presence of coronary angioplasty implant and graft: Secondary | ICD-10-CM

## 2021-05-14 NOTE — Progress Notes (Signed)
Daily Session Note  Patient Details  Name: Terry Cook MRN: [id number] Date of Birth: 02-Apr-1951 Referring Provider:   Flowsheet Row Cardiac Rehab from 03/07/2021 in [facility] Cardiac and Pulmonary Rehab  Referring Provider Kathlyn Sacramento MD       Encounter Date: 05/14/2021  Check In:  Session Check In - 05/14/21 0805       Check-In   Supervising physician immediately available to respond to emergencies See telemetry face sheet for immediately available ER MD    Location [facility]-Cardiac & Pulmonary Rehab    Staff Present Heath Lark, RN, BSN, CCRP;Joseph [place], RCP,RRT,BSRT;Kelly Ringgold, [place], ACSM CEP, Exercise Physiologist    Virtual Visit No    Medication changes reported     No    Fall or balance concerns reported    No    Warm-up and Cool-down Performed on first and last piece of equipment    Resistance Training Performed Yes    VAD Patient? No    PAD/SET Patient? No      Pain Assessment   Currently in Pain? No/denies                Social History   Tobacco Use  Smoking Status Never  Smokeless Tobacco Never    Goals Met:  Independence with exercise equipment Exercise tolerated well No report of concerns or symptoms today  Goals Unmet:  Not Applicable  Comments: Pt able to follow exercise prescription today without complaint.  Will continue to monitor for progression.    Dr. Emily Filbert is Medical Director for [place].  Dr. Ottie Glazier is Medical Director for [facility] Pulmonary Rehabilitation.

## 2021-05-16 ENCOUNTER — Other Ambulatory Visit: Payer: Self-pay

## 2021-05-16 DIAGNOSIS — Z955 Presence of coronary angioplasty implant and graft: Secondary | ICD-10-CM

## 2021-05-16 NOTE — Progress Notes (Signed)
Daily Session Note  Patient Details  Name: Terry Cook MRN: [id number] Date of Birth: 10-29-1950 Referring Provider:   Flowsheet Row Cardiac Rehab from 03/07/2021 in [facility] Cardiac and Pulmonary Rehab  Referring Provider Kathlyn Sacramento MD       Encounter Date: 05/16/2021  Check In:  Session Check In - 05/16/21 0718       Check-In   Supervising physician immediately available to respond to emergencies See telemetry face sheet for immediately available ER MD    Location [facility]-Cardiac & Pulmonary Rehab    Staff Present Birdie Sons, MPA, RN;Amanda Oletta Darter, BA, ACSM CEP, Exercise Physiologist;Melissa Caiola, RDN, LDN    Virtual Visit No    Medication changes reported     No    Fall or balance concerns reported    No    Warm-up and Cool-down Performed on first and last piece of equipment    Resistance Training Performed Yes    VAD Patient? No    PAD/SET Patient? No      Pain Assessment   Currently in Pain? No/denies                Social History   Tobacco Use  Smoking Status Never  Smokeless Tobacco Never    Goals Met:  Independence with exercise equipment Exercise tolerated well No report of concerns or symptoms today Strength training completed today  Goals Unmet:  Not Applicable  Comments: Pt able to follow exercise prescription today without complaint.  Will continue to monitor for progression.    Dr. Emily Filbert is Medical Director for [place].  Dr. Ottie Glazier is Medical Director for [facility] Pulmonary Rehabilitation.

## 2021-05-18 ENCOUNTER — Encounter: Payer: Medicare Other | Admitting: *Deleted

## 2021-05-18 ENCOUNTER — Other Ambulatory Visit: Payer: Self-pay

## 2021-05-18 DIAGNOSIS — Z955 Presence of coronary angioplasty implant and graft: Secondary | ICD-10-CM

## 2021-05-18 NOTE — Progress Notes (Signed)
Daily Session Note  Patient Details  Name: Terry Cook MRN: [id number] Date of Birth: Jun 20, 1950 Referring Provider:   Flowsheet Row Cardiac Rehab from 03/07/2021 in [facility] Cardiac and Pulmonary Rehab  Referring Provider Kathlyn Sacramento MD       Encounter Date: 05/18/2021  Check In:  Session Check In - 05/18/21 0808       Check-In   Supervising physician immediately available to respond to emergencies See telemetry face sheet for immediately available ER MD    Location [facility]-Cardiac & Pulmonary Rehab    Staff Present Alberteen Sam, MA, RCEP, CCRP, CCET;Joseph [place], [place];Heath Lark, RN, BSN, CCRP    Virtual Visit No    Medication changes reported     No    Fall or balance concerns reported    No    Warm-up and Cool-down Performed on first and last piece of equipment    Resistance Training Performed Yes    VAD Patient? No    PAD/SET Patient? No      Pain Assessment   Currently in Pain? No/denies                Social History   Tobacco Use  Smoking Status Never  Smokeless Tobacco Never    Goals Met:  Independence with exercise equipment Exercise tolerated well No report of concerns or symptoms today  Goals Unmet:  Not Applicable  Comments: Pt able to follow exercise prescription today without complaint.  Will continue to monitor for progression.    Dr. Emily Filbert is Medical Director for [place].  Dr. Ottie Glazier is Medical Director for [facility] Pulmonary Rehabilitation.

## 2021-05-21 ENCOUNTER — Other Ambulatory Visit: Payer: Self-pay

## 2021-05-21 ENCOUNTER — Encounter: Payer: Medicare Other | Admitting: *Deleted

## 2021-05-21 ENCOUNTER — Encounter: Payer: Self-pay | Admitting: *Deleted

## 2021-05-21 VITALS — Ht 66.5 in | Wt 194.0 lb

## 2021-05-21 DIAGNOSIS — Z955 Presence of coronary angioplasty implant and graft: Secondary | ICD-10-CM

## 2021-05-21 NOTE — Progress Notes (Signed)
Daily Session Note  Patient Details  Name: Terry Cook MRN: [id number] Date of Birth: May 19, 1950 Referring Provider:   Flowsheet Row Cardiac Rehab from 03/07/2021 in [facility] Cardiac and Pulmonary Rehab  Referring Provider Kathlyn Sacramento MD       Encounter Date: 05/21/2021  Check In:  Session Check In - 05/21/21 0819       Check-In   Supervising physician immediately available to respond to emergencies See telemetry face sheet for immediately available ER MD    Location [facility]-Cardiac & Pulmonary Rehab    Staff Present Earlean Shawl, BS, ACSM CEP, Exercise Physiologist;Joseph [place], [place];Heath Lark, RN, BSN, CCRP    Virtual Visit No    Medication changes reported     No    Fall or balance concerns reported    No    Warm-up and Cool-down Performed on first and last piece of equipment    Resistance Training Performed Yes    VAD Patient? No    PAD/SET Patient? No      Pain Assessment   Currently in Pain? No/denies              6 Minute Walk     Row Name 03/07/21 1316 05/21/21 0738       6 Minute Walk   Phase Initial Discharge    Distance 1360 feet 1435 feet    Distance % Change -- 6 %    Distance Feet Change -- 75 ft    Walk Time 6 minutes 6 minutes    # of Rest Breaks 0 0    MPH 2.58 2.72    METS 2.88 2.69    RPE 8 11    VO2 Peak 10.07 9.4    Symptoms Yes (comment) No    Comments tail bone/back pain 1/10, burning in thighs, SOB --    Resting HR 65 bpm 60 bpm    Resting BP 144/86 128/62    Resting Oxygen Saturation  96 % 95 %    Exercise Oxygen Saturation  during 6 min walk 96 % 94 %    Max Ex. HR 90 bpm 69 bpm    Max Ex. BP 158/86 142/70    2 Minute Post BP -- 130/80                Social History   Tobacco Use  Smoking Status Never  Smokeless Tobacco Never    Goals Met:  Independence with exercise equipment Exercise tolerated well No report of concerns or symptoms today  Goals Unmet:  Not Applicable  Comments: Pt able to  follow exercise prescription today without complaint.  Will continue to monitor for progression.    Dr. Emily Filbert is Medical Director for [place].  Dr. Ottie Glazier is Medical Director for [facility] Pulmonary Rehabilitation.

## 2021-05-23 ENCOUNTER — Other Ambulatory Visit: Payer: Self-pay

## 2021-05-23 DIAGNOSIS — Z955 Presence of coronary angioplasty implant and graft: Secondary | ICD-10-CM | POA: Diagnosis not present

## 2021-05-23 NOTE — Progress Notes (Signed)
Daily Session Note  Patient Details  Name: Terry Cook MRN: [id number] Date of Birth: [birth year] Referring Provider:   Flowsheet Row Cardiac Rehab from 03/07/2021 in [facility] Cardiac and Pulmonary Rehab  Referring Provider Kathlyn Sacramento MD       Encounter Date: 05/23/2021  Check In:  Session Check In - 05/23/21 4008       Check-In   Supervising physician immediately available to respond to emergencies See telemetry face sheet for immediately available ER MD    Location [facility]-Cardiac & Pulmonary Rehab    Staff Present Birdie Sons, MPA, Elveria Rising, BA, ACSM CEP, Exercise Physiologist;Joseph Tessie Fass, [place]    Virtual Visit No    Medication changes reported     No    Fall or balance concerns reported    No    Warm-up and Cool-down Performed on first and last piece of equipment    Resistance Training Performed Yes    VAD Patient? No    PAD/SET Patient? No      Pain Assessment   Currently in Pain? No/denies                Social History   Tobacco Use  Smoking Status Never  Smokeless Tobacco Never    Goals Met:  Independence with exercise equipment Exercise tolerated well No report of concerns or symptoms today Strength training completed today  Goals Unmet:  Not Applicable  Comments: Pt able to follow exercise prescription today without complaint.  Will continue to monitor for progression.    Dr. Emily Filbert is Medical Director for [place].  Dr. Ottie Glazier is Medical Director for [facility] Pulmonary Rehabilitation.

## 2021-05-24 ENCOUNTER — Telehealth: Payer: Self-pay | Admitting: *Deleted

## 2021-05-24 NOTE — Patient Instructions (Signed)
Discharge Patient Instructions  Patient Details  Name: Terry Cook MRN: [id number] Date of Birth: Mar 29, 1951 Referring Provider:  Wellington [place], MD   Number of Visits: 22  Reason for Discharge:  Patient reached a stable level of exercise. Patient independent in their exercise. Patient has met program and personal goals.  Smoking History:  Social History   Tobacco Use  Smoking Status Never  Smokeless Tobacco Never    Diagnosis:  Status post coronary artery stent placement  Initial Exercise Prescription:  Initial Exercise Prescription - 03/07/21 1300       Date of Initial Exercise RX and Referring Provider   Date 03/07/21    Referring Provider Kathlyn Sacramento MD      Oxygen   Maintain Oxygen Saturation 88% or higher      Treadmill   MPH 2.3    Grade 0.5    Minutes 15    METs 2.76      Recumbant Bike   Level 2    RPM 50    Watts 24    Minutes 15    METs 2.8      REL-XR   Level 2    Speed 50    Minutes 15    METs 2.8      Track   Laps 35    Minutes 15    METs 2.9      Prescription Details   Frequency (times per week) 3    Duration Progress to 30 minutes of continuous aerobic without signs/symptoms of physical distress      Intensity   THRR 40-80% of Max Heartrate 99-133    Ratings of Perceived Exertion 11-13    Perceived Dyspnea 0-4      Progression   Progression Continue to progress workloads to maintain intensity without signs/symptoms of physical distress.      Resistance Training   Training Prescription Yes    Weight 4  lb    Reps 10-15             Discharge Exercise Prescription (Final Exercise Prescription Changes):  Exercise Prescription Changes - 05/21/21 1600       Response to Exercise   Blood Pressure (Admit) 128/62    Blood Pressure (Exercise) 142/70    Blood Pressure (Exit) 128/64    Heart Rate (Admit) 60 bpm    Heart Rate (Exercise) 64 bpm    Heart Rate (Exit) 74 bpm    Rating of Perceived Exertion  (Exercise) 11    Duration Continue with 30 min of aerobic exercise without signs/symptoms of physical distress.    Intensity THRR unchanged      Progression   Progression Continue to progress workloads to maintain intensity without signs/symptoms of physical distress.    Average METs 4.9      Resistance Training   Training Prescription Yes    Weight 7 lb    Reps 10-15      Interval Training   Interval Training No      NuStep   Level 8    Minutes 15      Oxygen   Maintain Oxygen Saturation 88% or higher             Functional Capacity:  6 Minute Walk     Row Name 03/07/21 1316 05/21/21 0738       6 Minute Walk   Phase Initial Discharge    Distance 1360 feet 1435 feet    Distance % Change -- 6 %  Distance Feet Change -- 75 ft    Walk Time 6 minutes 6 minutes    # of Rest Breaks 0 0    MPH 2.58 2.72    METS 2.88 2.69    RPE 8 11    VO2 Peak 10.07 9.4    Symptoms Yes (comment) No    Comments tail bone/back pain 1/10, burning in thighs, SOB --    Resting HR 65 bpm 60 bpm    Resting BP 144/86 128/62    Resting Oxygen Saturation  96 % 95 %    Exercise Oxygen Saturation  during 6 min walk 96 % 94 %    Max Ex. HR 90 bpm 69 bpm    Max Ex. BP 158/86 142/70    2 Minute Post BP -- 130/80              Nutrition & Weight - Outcomes:  Pre Biometrics - 03/07/21 1339       Pre Biometrics   Height 5' 6.5" (1.689 m)    Weight 194 lb 9.6 oz (88.3 kg)    BMI (Calculated) 30.94    Single Leg Stand 4.8 seconds             Post Biometrics - 05/21/21 0740        Post  Biometrics   Height 5' 6.5" (1.689 m)    Weight 194 lb (88 kg)    BMI (Calculated) 30.85    Single Leg Stand 12.69 seconds             Nutrition:  Nutrition Therapy & Goals - 03/20/21 0728       Nutrition Therapy   Diet Heart Healthy Low Na    Drug/Food Interactions Statins/Certain Fruits    Protein (specify units) 70g    Fiber 30 grams    Whole Grain Foods 3 servings     Saturated Fats 12 max. grams    Fruits and Vegetables 8 servings/day    Sodium 1.5 grams      Personal Nutrition Goals   Nutrition Goal ST: Discussed introducing more vegetables instead of some of the servings of fruit, introduce one new vegetable per week, and pairing some fruit servings with protein and fat to better balance snacks (yogurt with fruit, nuts/seeds or nut/seed butter with fruit) LT: increase variety, reduce overal fruit servings by at least 2, follow MyPlate guidelines for meals and the fiber, protein, fat combination for snacks.    Comments 71 y.o. M admitted to rehab s/p stent placement also presents with HTN, CAD, HLD. PMH includes previous MI. Relevant medications include lipitor, xanax, zoloft, ambien, fibercon, oxycodone. PYP 69. He repots having many heart healthy education sessions in his life as well as reading articles and feels he does not need more nutrition education at this time. He has no "junk" or fried foods, very little red meat, he uses olive oil mixed with lemon juice and some spices. He eats about 4 bananas, 4 oranges, and 3 apples per day. He eats broccoli and stringbeans - he feels he should get more, 1 baked potato per week. He reports not picking up a salt shaker for 40 years.  He will have yogurt with fresh blueberries during the day to help with hunger. B: scrambled egg (with some olive oil based margarine) 2x/week or fruit L: [place] sandwich with mustard or peanut butter sandwich (whole wheat). D: sometimes bowl of cereal with low fat milk, Last night he had salmon with no butter  and spices as well as a salad with olive oil and vinegar. Drinks: water 5-6 glasses. His constipation is alliviated by the fibercon he takes daily. Reviewed general heart healthy eating, balance in meals, and provided nutrition handout for him to review. Discussed introducing more vegetables instead of some of the servings of fruit, increasing variety, and pairing some fruit servings with  protein and fat to better balance snacks (yogurt with fruit, nuts/seeds or nut/seed butter with fruit)      Intervention Plan   Intervention Prescribe, educate and counsel regarding individualized specific dietary modifications aiming towards targeted core components such as weight, hypertension, lipid management, diabetes, heart failure and other comorbidities.    Expected Outcomes Short Term Goal: Understand basic principles of dietary content, such as calories, fat, sodium, cholesterol and nutrients.;Short Term Goal: A plan has been developed with personal nutrition goals set during dietitian appointment.;Long Term Goal: Adherence to prescribed nutrition plan.              Goals reviewed with patient; copy given to patient.

## 2021-05-24 NOTE — Telephone Encounter (Signed)
Left message on patients voicemail informing him of the date, time and location for his sleep study.

## 2021-05-28 ENCOUNTER — Other Ambulatory Visit: Payer: Self-pay

## 2021-05-28 ENCOUNTER — Encounter: Payer: Medicare Other | Admitting: *Deleted

## 2021-05-28 DIAGNOSIS — Z955 Presence of coronary angioplasty implant and graft: Secondary | ICD-10-CM

## 2021-05-28 NOTE — Progress Notes (Signed)
Daily Session Note  Patient Details  Name: Terry Cook MRN: [id number] Date of Birth: [birth year] Referring Provider:   Flowsheet Row Cardiac Rehab from 03/07/2021 in [facility] Cardiac and Pulmonary Rehab  Referring Provider Kathlyn Sacramento MD       Encounter Date: 05/28/2021  Check In:  Session Check In - 05/28/21 0729       Check-In   Supervising physician immediately available to respond to emergencies See telemetry face sheet for immediately available ER MD    Location [facility]-Cardiac & Pulmonary Rehab    Staff Present Justin Mend, Jaci Carrel, BS, ACSM CEP, Exercise Physiologist;Shandon Matson, RN, BSN, CCRP    Virtual Visit No    Medication changes reported     No    Fall or balance concerns reported    No    Warm-up and Cool-down Performed on first and last piece of equipment    Resistance Training Performed Yes    VAD Patient? No    PAD/SET Patient? No      Pain Assessment   Currently in Pain? No/denies                Social History   Tobacco Use  Smoking Status Never  Smokeless Tobacco Never    Goals Met:  Independence with exercise equipment Exercise tolerated well No report of concerns or symptoms today  Goals Unmet:  Not Applicable  Comments: Pt able to follow exercise prescription today without complaint.  Will continue to monitor for progression.    Dr. Emily Filbert is Medical Director for [place].  Dr. Ottie Glazier is Medical Director for [facility] Pulmonary Rehabilitation.

## 2021-05-30 ENCOUNTER — Encounter: Payer: Self-pay | Admitting: *Deleted

## 2021-05-30 ENCOUNTER — Other Ambulatory Visit: Payer: Self-pay

## 2021-05-30 DIAGNOSIS — Z955 Presence of coronary angioplasty implant and graft: Secondary | ICD-10-CM

## 2021-05-30 NOTE — Progress Notes (Signed)
Daily Session Note  Patient Details  Name: Terry Cook MRN: [id number] Date of Birth: 03/31/1951 Referring Provider:   Flowsheet Row Cardiac Rehab from 03/07/2021 in [facility] Cardiac and Pulmonary Rehab  Referring Provider Kathlyn Sacramento MD       Encounter Date: 05/30/2021  Check In:  Session Check In - 05/30/21 0721       Check-In   Supervising physician immediately available to respond to emergencies See telemetry face sheet for immediately available ER MD    Location [facility]-Cardiac & Pulmonary Rehab    Staff Present Birdie Sons, MPA, Elveria Rising, BA, ACSM CEP, Exercise Physiologist;Joseph Tessie Fass, [place]    Virtual Visit No    Medication changes reported     No    Fall or balance concerns reported    No    Warm-up and Cool-down Performed on first and last piece of equipment    Resistance Training Performed Yes    VAD Patient? No    PAD/SET Patient? No      Pain Assessment   Currently in Pain? No/denies                Social History   Tobacco Use  Smoking Status Never  Smokeless Tobacco Never    Goals Met:  Independence with exercise equipment Exercise tolerated well No report of concerns or symptoms today Strength training completed today  Goals Unmet:  Not Applicable  Comments: Pt able to follow exercise prescription today without complaint.  Will continue to monitor for progression.    Dr. Emily Filbert is Medical Director for [place].  Dr. Ottie Glazier is Medical Director for [facility] Pulmonary Rehabilitation.

## 2021-05-30 NOTE — Progress Notes (Signed)
Cardiac Individual Treatment Plan  Patient Details  Name: TOMASZ STEEVES MRN: [id number] Date of Birth: [birth year] Referring Provider:   Flowsheet Row Cardiac Rehab from 03/07/2021 in [facility] Cardiac and Pulmonary Rehab  Referring Provider Kathlyn Sacramento MD       Initial Encounter Date:  Flowsheet Row Cardiac Rehab from 03/07/2021 in [facility] Cardiac and Pulmonary Rehab  Date 03/07/21       Visit Diagnosis: Status post coronary artery stent placement  Patient's Home Medications on Admission:  Current Outpatient Medications:    acetaminophen (TYLENOL) 500 MG tablet, Take 1,000 mg by mouth every 8 (eight) hours as needed for mild pain or moderate pain., Disp: , Rfl:    ALPRAZolam (XANAX) 0.5 MG tablet, Take 0.5 mg by mouth 2 (two) times daily as needed for anxiety., Disp: , Rfl:    amLODipine (NORVASC) 10 MG tablet, Take 1 tablet (10 mg total) by mouth daily., Disp: 90 tablet, Rfl: 1   aspirin EC 81 MG tablet, Take 81 mg by mouth daily., Disp: , Rfl:    atorvastatin (LIPITOR) 80 MG tablet, Take 1 tablet (80 mg total) by mouth every evening., Disp: 90 tablet, Rfl: 1   clopidogrel (PLAVIX) 75 MG tablet, Take 1 tablet (75 mg total) by mouth daily., Disp: 90 tablet, Rfl: 3   diclofenac sodium (VOLTAREN) 1 % GEL, Apply 1 application topically 3 (three) times daily. Apply to feet, Disp: , Rfl:    ezetimibe (ZETIA) 10 MG tablet, Take 1 tablet (10 mg total) by mouth daily., Disp: 90 tablet, Rfl: 3   hydrOXYzine (VISTARIL) 25 MG capsule, Take 50 mg by mouth every 8 (eight) hours as needed., Disp: , Rfl:    losartan-hydrochlorothiazide (HYZAAR) 100-25 MG tablet, Take 1 tablet by mouth daily., Disp: , Rfl:    naloxone (NARCAN) nasal spray 4 mg/0.1 mL, SMARTSIG:Both Nares Once a Week, Disp: , Rfl:    Oxycodone HCl 10 MG TABS, Take 10 mg by mouth 3 (three) times daily., Disp: , Rfl:    polycarbophil (FIBERCON) 625 MG tablet, Take 1,250 mg by mouth daily., Disp: , Rfl:    sertraline (ZOLOFT) 25 MG  tablet, Take 75 mg by mouth daily., Disp: , Rfl:    silodosin (RAPAFLO) 8 MG CAPS capsule, Take 1 capsule (8 mg total) by mouth daily with breakfast., Disp: 90 capsule, Rfl: 3   zolpidem (AMBIEN) 10 MG tablet, Take 10 mg by mouth at bedtime., Disp: , Rfl:   Past Medical History: Past Medical History:  Diagnosis Date   Anxiety    CAD (coronary artery disease), native coronary artery 06/04/2011   s/p MI with PCI to left circ   Chronic lower back pain    DDD (degenerative disc disease), lumbar    Depression    High cholesterol    History of kidney stones    Hypertension    Irregular heart beats    MI (myocardial infarction) ([place]) 05/2011   OSA on CPAP     Tobacco Use: Social History   Tobacco Use  Smoking Status Never  Smokeless Tobacco Never    Labs: Recent Review Flowsheet Data     Labs for ITP Cardiac and Pulmonary Rehab Latest Ref Rng & Units 06/03/2011 06/04/2011 11/18/2013 02/26/2021 04/24/2021   Cholestrol 100 - 199 mg/dL - 133 139 141 112   LDLCALC 0 - 99 mg/dL - 69 63 81 56   LDLDIRECT 0 - 99 mg/dL - - - 82 59   HDL >39 mg/dL - 35(L) 38(L)  32(L) 30(L)   Trlycerides 0 - 149 mg/dL - 147 190(H) 163(H) 147   TCO2 0 - 100 mmol/L 32 - - - -        Exercise Target Goals: Exercise Program Goal: Individual exercise prescription set using results from initial 6 min walk test and THRR while considering  patients activity barriers and safety.   Exercise Prescription Goal: Initial exercise prescription builds to 30-45 minutes a day of aerobic activity, 2-3 days per week.  Home exercise guidelines will be given to patient during program as part of exercise prescription that the participant will acknowledge.   Education: Aerobic Exercise: - Group verbal and visual presentation on the components of exercise prescription. Introduces F.I.T.T principle from ACSM for exercise prescriptions.  Reviews F.I.T.T. principles of aerobic exercise including progression. Written material  given at graduation.   Education: Resistance Exercise: - Group verbal and visual presentation on the components of exercise prescription. Introduces F.I.T.T principle from ACSM for exercise prescriptions  Reviews F.I.T.T. principles of resistance exercise including progression. Written material given at graduation.    Education: Exercise & Equipment Safety: - Individual verbal instruction and demonstration of equipment use and safety with use of the equipment. Flowsheet Row Cardiac Rehab from 05/09/2021 in [facility] Cardiac and Pulmonary Rehab  Date 03/07/21  Educator [facility]  Instruction Review Code 1- Verbalizes Understanding       Education: Exercise Physiology & General Exercise Guidelines: - Group verbal and written instruction with models to review the exercise physiology of the cardiovascular system and associated critical values. Provides general exercise guidelines with specific guidelines to those with heart or lung disease.  Flowsheet Row Cardiac Rehab from 05/09/2021 in [facility] Cardiac and Pulmonary Rehab  Date 03/28/21  Educator [facility]  Instruction Review Code 1- Verbalizes Understanding       Education: Flexibility, Balance, Mind/Body Relaxation: - Group verbal and visual presentation with interactive activity on the components of exercise prescription. Introduces F.I.T.T principle from ACSM for exercise prescriptions. Reviews F.I.T.T. principles of flexibility and balance exercise training including progression. Also discusses the mind body connection.  Reviews various relaxation techniques to help reduce and manage stress (i.e. Deep breathing, progressive muscle relaxation, and visualization). Balance handout provided to take home. Written material given at graduation.   Activity Barriers & Risk Stratification:  Activity Barriers & Cardiac Risk Stratification - 03/07/21 1317       Activity Barriers & Cardiac Risk Stratification   Activity Barriers Arthritis;Back Problems;Neck/Spine  Problems;Deconditioning;Muscular Weakness;Shortness of Breath;Balance Concerns   cervical stenosis, chronic low back pain with DDD   Cardiac Risk Stratification Moderate             6 Minute Walk:  6 Minute Walk     Row Name 03/07/21 1316 05/21/21 0738       6 Minute Walk   Phase Initial Discharge    Distance 1360 feet 1435 feet    Distance % Change -- 6 %    Distance Feet Change -- 75 ft    Walk Time 6 minutes 6 minutes    # of Rest Breaks 0 0    MPH 2.58 2.72    METS 2.88 2.69    RPE 8 11    VO2 Peak 10.07 9.4    Symptoms Yes (comment) No    Comments tail bone/back pain 1/10, burning in thighs, SOB --    Resting HR 65 bpm 60 bpm    Resting BP 144/86 128/62    Resting Oxygen Saturation  96 %  95 %    Exercise Oxygen Saturation  during 6 min walk 96 % 94 %    Max Ex. HR 90 bpm 69 bpm    Max Ex. BP 158/86 142/70    2 Minute Post BP -- 130/80             Oxygen Initial Assessment:   Oxygen Re-Evaluation:   Oxygen Discharge (Final Oxygen Re-Evaluation):   Initial Exercise Prescription:  Initial Exercise Prescription - 03/07/21 1300       Date of Initial Exercise RX and Referring Provider   Date 03/07/21    Referring Provider Kathlyn Sacramento MD      Oxygen   Maintain Oxygen Saturation 88% or higher      Treadmill   MPH 2.3    Grade 0.5    Minutes 15    METs 2.76      Recumbant Bike   Level 2    RPM 50    Watts 24    Minutes 15    METs 2.8      REL-XR   Level 2    Speed 50    Minutes 15    METs 2.8      Track   Laps 35    Minutes 15    METs 2.9      Prescription Details   Frequency (times per week) 3    Duration Progress to 30 minutes of continuous aerobic without signs/symptoms of physical distress      Intensity   THRR 40-80% of Max Heartrate 99-133    Ratings of Perceived Exertion 11-13    Perceived Dyspnea 0-4      Progression   Progression Continue to progress workloads to maintain intensity without signs/symptoms of  physical distress.      Resistance Training   Training Prescription Yes    Weight 4  lb    Reps 10-15             Perform Capillary Blood Glucose checks as needed.  Exercise Prescription Changes:   Exercise Prescription Changes     Row Name 03/07/21 1300 03/12/21 1000 03/26/21 1700 04/10/21 1500 04/23/21 1300     Response to Exercise   Blood Pressure (Admit) 144/86 124/56 126/78 102/60 136/90   Blood Pressure (Exercise) 158/86 150/60 -- -- --   Blood Pressure (Exit) 124/58 122/60 128/64 112/60 118/60   Heart Rate (Admit) 65 bpm 75 bpm 80 bpm 74 bpm 93 bpm   Heart Rate (Exercise) 90 bpm 102 bpm 101 bpm 120 bpm 115 bpm   Heart Rate (Exit) 70 bpm 68 bpm 78 bpm 73 bpm 66 bpm   Oxygen Saturation (Admit) 96 % -- -- -- --   Oxygen Saturation (Exercise) 96 % -- -- -- --   Rating of Perceived Exertion (Exercise) _0 Symptoms tail bone/back pain 1/10, thighs burning, SOB none none back pain on elliptical second try --   Comments walk test results 1st full day of exercise -- -- --   Duration -- Progress to 30 minutes of  aerobic without signs/symptoms of physical distress Progress to 30 minutes of  aerobic without signs/symptoms of physical distress Continue with 30 min of aerobic exercise without signs/symptoms of physical distress. Continue with 30 min of aerobic exercise without signs/symptoms of physical distress.   Intensity -- THRR unchanged THRR unchanged THRR unchanged THRR unchanged     Progression   Progression -- Continue to progress workloads to maintain intensity  without signs/symptoms of physical distress. Continue to progress workloads to maintain intensity without signs/symptoms of physical distress. Continue to progress workloads to maintain intensity without signs/symptoms of physical distress. Continue to progress workloads to maintain intensity without signs/symptoms of physical distress.   Average METs -- 2.97 5.25 3.78 3.5     Resistance Training    Training Prescription -- Yes Yes Yes Yes   Weight -- 4 lb 6 lb 8 lb 6 lb   Reps -- 10-15 10-15 10-15 10-15     Interval Training   Interval Training -- No No No No     Treadmill   MPH -- 2.5 -- 2.7 --   Grade -- 0.5 -- 1 --   Minutes -- 15 -- 15 --   METs -- 3.09 -- 3.44 --     Recumbant Bike   Level -- 2 -- -- 4   Watts -- 25 -- -- --   Minutes -- 15 -- -- 15   METs -- 2.86 -- -- 2.85     NuStep   Level -- -- _0 Minutes -- -- _1 METs -- -- 5.7 5.7 4.1     Arm Ergometer   Level -- -- -- 1.5 --   Minutes -- -- -- 15 --   METs -- -- -- 2.7 --     Elliptical   Level -- -- -- 1 --   Speed -- -- -- 2 --   Minutes -- -- -- 15 --   METs -- -- -- 2.2 --     REL-XR   Level -- -- 4 4 --   Minutes -- -- 15 15 --   METs -- -- 4.8 4.8 --     Oxygen   Maintain Oxygen Saturation -- -- -- 88% or higher 88% or higher    Row Name 05/07/21 1000 05/21/21 1600           Response to Exercise   Blood Pressure (Admit) 148/78 128/62      Blood Pressure (Exercise) -- 142/70      Blood Pressure (Exit) 122/60 128/64      Heart Rate (Admit) 79 bpm 60 bpm      Heart Rate (Exercise) 93 bpm 64 bpm      Heart Rate (Exit) 84 bpm 74 bpm      Rating of Perceived Exertion (Exercise) 14 11      Duration Continue with 30 min of aerobic exercise without signs/symptoms of physical distress. Continue with 30 min of aerobic exercise without signs/symptoms of physical distress.      Intensity THRR unchanged THRR unchanged        Progression   Progression -- Continue to progress workloads to maintain intensity without signs/symptoms of physical distress.      Average METs -- 4.9        Resistance Training   Training Prescription Yes Yes      Weight 7 lb 7 lb      Reps 10-15 10-15        Interval Training   Interval Training No No        Recumbant Bike   Level 4.5 --      Watts 24 --      Minutes 15 --      METs 2.85 --        NuStep   Level 7 8      Minutes 15 15  METs 4.6 --        REL-XR   Level 8 --      Minutes 15 --      METs 4.2 --        Biostep-RELP   Level 10 --      Minutes 15 --      METs 4 --        Oxygen   Maintain Oxygen Saturation 88% or higher 88% or higher               Exercise Comments:   Exercise Comments     Row Name 03/09/21 0808           Exercise Comments First full day of exercise!  Patient was oriented to gym and equipment including functions, settings, policies, and procedures.  Patient's individual exercise prescription and treatment plan were reviewed.  All starting workloads were established based on the results of the 6 minute walk test done at initial orientation visit.  The plan for exercise progression was also introduced and progression will be customized based on patient's performance and goals.                Exercise Goals and Review:   Exercise Goals     Row Name 03/07/21 1338             Exercise Goals   Increase Physical Activity Yes       Intervention Provide advice, education, support and counseling about physical activity/exercise needs.;Develop an individualized exercise prescription for aerobic and resistive training based on initial evaluation findings, risk stratification, comorbidities and participant's personal goals.       Expected Outcomes Short Term: Attend rehab on a regular basis to increase amount of physical activity.;Long Term: Add in home exercise to make exercise part of routine and to increase amount of physical activity.;Long Term: Exercising regularly at least 3-5 days a week.       Increase Strength and Stamina Yes       Intervention Provide advice, education, support and counseling about physical activity/exercise needs.;Develop an individualized exercise prescription for aerobic and resistive training based on initial evaluation findings, risk stratification, comorbidities and participant's personal goals.       Expected Outcomes Short Term: Increase  workloads from initial exercise prescription for resistance, speed, and METs.;Long Term: Improve cardiorespiratory fitness, muscular endurance and strength as measured by increased METs and functional capacity (6MWT);Short Term: Perform resistance training exercises routinely during rehab and add in resistance training at home       Able to understand and use rate of perceived exertion (RPE) scale Yes       Intervention Provide education and explanation on how to use RPE scale       Expected Outcomes Short Term: Able to use RPE daily in rehab to express subjective intensity level;Long Term:  Able to use RPE to guide intensity level when exercising independently       Able to understand and use Dyspnea scale Yes       Intervention Provide education and explanation on how to use Dyspnea scale       Expected Outcomes Short Term: Able to use Dyspnea scale daily in rehab to express subjective sense of shortness of breath during exertion;Long Term: Able to use Dyspnea scale to guide intensity level when exercising independently       Knowledge and understanding of Target Heart Rate Range (THRR) Yes       Intervention Provide education and explanation  of THRR including how the numbers were predicted and where they are located for reference       Expected Outcomes Short Term: Able to state/look up THRR;Long Term: Able to use THRR to govern intensity when exercising independently;Short Term: Able to use daily as guideline for intensity in rehab       Able to check pulse independently Yes       Intervention Provide education and demonstration on how to check pulse in carotid and radial arteries.;Review the importance of being able to check your own pulse for safety during independent exercise       Expected Outcomes Short Term: Able to explain why pulse checking is important during independent exercise;Long Term: Able to check pulse independently and accurately       Understanding of Exercise Prescription Yes        Intervention Provide education, explanation, and written materials on patient's individual exercise prescription       Expected Outcomes Short Term: Able to explain program exercise prescription;Long Term: Able to explain home exercise prescription to exercise independently                Exercise Goals Re-Evaluation :  Exercise Goals Re-Evaluation     Row Name 03/09/21 0809 03/12/21 1042 03/14/21 0747 03/26/21 1736 04/10/21 1540     Exercise Goal Re-Evaluation   Exercise Goals Review Able to understand and use rate of perceived exertion (RPE) scale;Knowledge and understanding of Target Heart Rate Range (THRR);Understanding of Exercise Prescription;Able to understand and use Dyspnea scale Increase Physical Activity;Increase Strength and Stamina Increase Physical Activity;Increase Strength and Stamina Increase Physical Activity;Increase Strength and Stamina Increase Physical Activity;Increase Strength and Stamina;Understanding of Exercise Prescription   Comments Reviewed RPE and dyspnea scales, THR and program prescription with pt today.  Pt voiced understanding and was given a copy of goals to take home. Nocholas did well his first full session of exercise at rehab. He tolerated his exercise prescription well and even increased to 2.5 speed on the treadmill. His RPEs were appropriate. Will continue to monitor as he progresses through the program. Reviewed home exercise with pt today.  Pt plans to use gym where he lives for exercise.  Reviewed THR, pulse, RPE, sign and symptoms, pulse oximetery and when to call 911 or MD.  Also discussed weather considerations and indoor options.  Pt voiced understanding. Cheyne is progressing well and has increase average MET level to 5.25.  he has also increased to 6 lb for strength work. Eleno is doing well in rehab.   He is now up to 8 lb hand weights and has tried out the elliptical some along with arm crank.  We will continue to monitor his progress.    Expected Outcomes Short: Use RPE daily to regulate intensity. Long: Follow program prescription in THR. Short: Continue current exercise prescription and increase when appropriate Long: Gradually increase overall MET level Short: monitor HR when exercising on his own Long: maintain exercise independently Short: continue to attend consistently Long: continue to build stamina Short: Continue to increase stamina on elliptical Long: Continue to improve stamina.    [place] Name 04/11/21 0731 04/23/21 1340 05/07/21 1054 05/16/21 0754 05/21/21 1641     Exercise Goal Re-Evaluation   Exercise Goals Review Increase Physical Activity;Increase Strength and Stamina;Understanding of Exercise Prescription Increase Physical Activity;Increase Strength and Stamina Increase Physical Activity;Increase Strength and Stamina Increase Physical Activity;Increase Strength and Stamina Increase Physical Activity;Increase Strength and Stamina   Comments Dyshawn is doing well in  rehab.  His back continues to be a limitation.  He has a pinched nerve and see the doctor again next week for it.  This has been going on for about 35 years. He stays active at work at least 3 days a week.  At work he lifts and moves boxes frequently. He has not really been doing much cardio. Rajat has been doing seated machines and not walking to help his back pain resolve some.  Hopefuly this will get better and he can continue to exercise. We will continue to monitor. Denny is doing well in rehab. He has increased to level 8 on the XR as well as increased his handweights to 7 lbs. He is not quite hitting his [place] but his RPEs are in appropriate range. Will continue to monitor. Landin reports having an active job and has chronic pain. He has a gym where he lives and thinks they have a recumbant bike - he has silver sneakers so if not he was thinking of [employer] or [employer]. Jordin has done well in rehab and will complete the program soon.  His chronic back  pain makes walking difficult.   Expected Outcomes Short: Talk to doctor about back Long: conitnue to improve stamina. Short: try walking when back pain is better Long: exercise with less back pain Short: Continue building up tolerance with walking when ready Long: Continue to increase overall MET level Short: Continue building up tolerance with walking when ready Long: Continue to increase overall MET level Short: complete HT Long: maintain exercise independently            Discharge Exercise Prescription (Final Exercise Prescription Changes):  Exercise Prescription Changes - 05/21/21 1600       Response to Exercise   Blood Pressure (Admit) 128/62    Blood Pressure (Exercise) 142/70    Blood Pressure (Exit) 128/64    Heart Rate (Admit) 60 bpm    Heart Rate (Exercise) 64 bpm    Heart Rate (Exit) 74 bpm    Rating of Perceived Exertion (Exercise) 11    Duration Continue with 30 min of aerobic exercise without signs/symptoms of physical distress.    Intensity THRR unchanged      Progression   Progression Continue to progress workloads to maintain intensity without signs/symptoms of physical distress.    Average METs 4.9      Resistance Training   Training Prescription Yes    Weight 7 lb    Reps 10-15      Interval Training   Interval Training No      NuStep   Level 8    Minutes 15      Oxygen   Maintain Oxygen Saturation 88% or higher             Nutrition:  Target Goals: Understanding of nutrition guidelines, daily intake of sodium <1520m, cholesterol <2059m calories 30% from fat and 7% or less from saturated fats, daily to have 5 or more servings of fruits and vegetables.  Education: All About Nutrition: -Group instruction provided by verbal, written material, interactive activities, discussions, models, and posters to present general guidelines for heart healthy nutrition including fat, fiber, MyPlate, the role of sodium in heart healthy nutrition, utilization of  the nutrition label, and utilization of this knowledge for meal planning. Follow up email sent as well. Written material given at graduation. Flowsheet Row [facility]  Education need identified 03/07/21  Biometrics:  Pre Biometrics - 03/07/21 1339       Pre Biometrics   Height 5' 6.5" (1.689 m)    Weight 194 lb 9.6 oz (88.3 kg)    BMI (Calculated) 30.94    Single Leg Stand 4.8 seconds             Post Biometrics - 05/21/21 0740        Post  Biometrics   Height 5' 6.5" (1.689 m)    Weight 194 lb (88 kg)    BMI (Calculated) 30.85    Single Leg Stand 12.69 seconds             Nutrition Therapy Plan and Nutrition Goals:  Nutrition Therapy & Goals - 03/20/21 0728       Nutrition Therapy   Diet Heart Healthy Low Na    Drug/Food Interactions Statins/Certain Fruits    Protein (specify units) 70g    Fiber 30 grams    Whole Grain Foods 3 servings    Saturated Fats 12 max. grams    Fruits and Vegetables 8 servings/day    Sodium 1.5 grams      Personal Nutrition Goals   Nutrition Goal ST: Discussed introducing more vegetables instead of some of the servings of fruit, introduce one new vegetable per week, and pairing some fruit servings with protein and fat to better balance snacks (yogurt with fruit, nuts/seeds or nut/seed butter with fruit) LT: increase variety, reduce overal fruit servings by at least 2, follow MyPlate guidelines for meals and the fiber, protein, fat combination for snacks.    Comments 71 y.o. M admitted to rehab s/p stent placement also presents with HTN, CAD, HLD. PMH includes previous MI. Relevant medications include lipitor, xanax, zoloft, ambien, fibercon, oxycodone. PYP 69. He repots having many heart healthy education sessions in his life as well as reading articles and feels he does not need more nutrition education at this time. He has no "junk" or fried foods, very little red meat, he uses  olive oil mixed with lemon juice and some spices. He eats about 4 bananas, 4 oranges, and 3 apples per day. He eats broccoli and stringbeans - he feels he should get more, 1 baked potato per week. He reports not picking up a salt shaker for 40 years.  He will have yogurt with fresh blueberries during the day to help with hunger. B: scrambled egg (with some olive oil based margarine) 2x/week or fruit L: [place] sandwich with mustard or peanut butter sandwich (whole wheat). D: sometimes bowl of cereal with low fat milk, Last night he had salmon with no butter and spices as well as a salad with olive oil and vinegar. Drinks: water 5-6 glasses. His constipation is alliviated by the fibercon he takes daily. Reviewed general heart healthy eating, balance in meals, and provided nutrition handout for him to review. Discussed introducing more vegetables instead of some of the servings of fruit, increasing variety, and pairing some fruit servings with protein and fat to better balance snacks (yogurt with fruit, nuts/seeds or nut/seed butter with fruit)      Intervention Plan   Intervention Prescribe, educate and counsel regarding individualized specific dietary modifications aiming towards targeted core components such as weight, hypertension, lipid management, diabetes, heart failure and other comorbidities.    Expected Outcomes Short Term Goal: Understand basic principles of dietary content, such as calories, fat, sodium, cholesterol and nutrients.;Short Term Goal: A plan has been developed with personal nutrition goals set during dietitian  appointment.;Long Term Goal: Adherence to prescribed nutrition plan.             Nutrition Assessments:  MEDIFICTS Score Key: ?70 Need to make dietary changes  40-70 Heart Healthy Diet ? 40 Therapeutic Level Cholesterol Diet  Flowsheet Row Cardiac Rehab from 03/07/2021 in [facility] Cardiac and Pulmonary Rehab  Picture Your Plate Total Score on Admission 69      Picture  Your Plate Scores: <00 Unhealthy dietary pattern with much room for improvement. 41-50 Dietary pattern unlikely to meet recommendations for good health and room for improvement. 51-60 More healthful dietary pattern, with some room for improvement.  >60 Healthy dietary pattern, although there may be some specific behaviors that could be improved.    Nutrition Goals Re-Evaluation:  Nutrition Goals Re-Evaluation     Row Name 03/14/21 0724 04/11/21 0737 05/16/21 0727         Goals   Nutrition Goal -- ST: Discussed introducing more vegetables instead of some of the servings of fruit, introduce one new vegetable per week, and pairing some fruit servings with protein and fat to better balance snacks (yogurt with fruit, nuts/seeds or nut/seed butter with fruit) LT: increase variety, reduce overal fruit servings by at least 2, follow MyPlate guidelines for meals and the fiber, protein, fat combination for snacks. ST: Discussed introducing more vegetables instead of some of the servings of fruit, introduce one new vegetable per week, and pairing some fruit servings with protein and fat to better balance snacks (yogurt with fruit, nuts/seeds or nut/seed butter with fruit) LT: increase variety, reduce overall fruit servings by at least 2, follow MyPlate guidelines for meals and the fiber, protein, fat combination for snacks.     Comment Nutrition appointment scheduled for 12/12 after class. Kristoph is doing well with his diet.  He says he "eats like a squirrel"!  He has lots of fruits.  He stays away from red meat and limits himself to 3 eggs a week.  He is doing well with variety.  He is also trying to get fish at least once a week. He is also getting baked chicken.  He aims to get is some vegetables. Yaacov reports including whole grains like oatmeal with fruit like blueberries and banana and glass of OJ, sometimes he will have eggs with OJ and fruit. He continues to not eat lunch. This week he has had stoffers  fried apple and a hot dog, grilled cheese (whole wheat with olive oil or margarine), peanut butter sandwich, sandwich with roast beef or [place], grilled salmon and salad. He eats strawberry or vamilla yogurt 1/2 one per day with fruit. Encourged to include more vegetables: raw with dips like hummus, tzatiki, or tahini and roasted vegetables as options. He reports when he works he will eat 3-4 hours in a 5 hour shift, discussed adding in peanut butter to make that more balanced.     Expected Outcome -- Short: Continue with good variety and ensure enough protein Long: Continue to aim for heart healthy diet. ST: Discussed introducing more vegetables instead of some of the servings of fruit, introduce one new vegetable per week, and pairing some fruit servings with protein and fat to better balance snacks (yogurt with fruit, nuts/seeds or nut/seed butter with fruit) LT: increase variety, reduce overall fruit servings by at least 2, follow MyPlate guidelines for meals and the fiber, protein, fat combination for snacks.              Nutrition Goals Discharge (Final  Nutrition Goals Re-Evaluation):  Nutrition Goals Re-Evaluation - 05/16/21 0727       Goals   Nutrition Goal ST: Discussed introducing more vegetables instead of some of the servings of fruit, introduce one new vegetable per week, and pairing some fruit servings with protein and fat to better balance snacks (yogurt with fruit, nuts/seeds or nut/seed butter with fruit) LT: increase variety, reduce overall fruit servings by at least 2, follow MyPlate guidelines for meals and the fiber, protein, fat combination for snacks.    Comment Bernal reports including whole grains like oatmeal with fruit like blueberries and banana and glass of OJ, sometimes he will have eggs with OJ and fruit. He continues to not eat lunch. This week he has had stoffers fried apple and a hot dog, grilled cheese (whole wheat with olive oil or margarine), peanut butter  sandwich, sandwich with roast beef or [place], grilled salmon and salad. He eats strawberry or vamilla yogurt 1/2 one per day with fruit. Encourged to include more vegetables: raw with dips like hummus, tzatiki, or tahini and roasted vegetables as options. He reports when he works he will eat 3-4 hours in a 5 hour shift, discussed adding in peanut butter to make that more balanced.    Expected Outcome ST: Discussed introducing more vegetables instead of some of the servings of fruit, introduce one new vegetable per week, and pairing some fruit servings with protein and fat to better balance snacks (yogurt with fruit, nuts/seeds or nut/seed butter with fruit) LT: increase variety, reduce overall fruit servings by at least 2, follow MyPlate guidelines for meals and the fiber, protein, fat combination for snacks.             Psychosocial: Target Goals: Acknowledge presence or absence of significant depression and/or stress, maximize coping skills, provide positive support system. Participant is able to verbalize types and ability to use techniques and skills needed for reducing stress and depression.   Education: Stress, Anxiety, and Depression - Group verbal and visual presentation to define topics covered.  Reviews how body is impacted by stress, anxiety, and depression.  Also discusses healthy ways to reduce stress and to treat/manage anxiety and depression.  Written material given at graduation. Flowsheet Row Cardiac Rehab from 05/09/2021 in [facility] Cardiac and Pulmonary Rehab  Date 03/21/21  Educator [facility]  Instruction Review Code 1- Verbalizes Understanding       Education: Sleep Hygiene -Provides group verbal and written instruction about how sleep can affect your health.  Define sleep hygiene, discuss sleep cycles and impact of sleep habits. Review good sleep hygiene tips.    Initial Review & Psychosocial Screening:  Initial Psych Review & Screening - 02/16/21 1010       Initial Review    Current issues with Current Psychotropic Meds;Current Stress Concerns    Source of Stress Concerns Financial    Comments Retired      [place]? Yes   Wife , son and his wife  very close     Barriers   Psychosocial barriers to participate in program There are no identifiable barriers or psychosocial needs.;The patient should benefit from training in stress management and relaxation.      Screening Interventions   Interventions Encouraged to exercise;To provide support and resources with identified psychosocial needs;Provide feedback about the scores to participant    Expected Outcomes Short Term goal: Utilizing psychosocial counselor, staff and physician to assist with identification of specific Stressors or current issues interfering  with healing process. Setting desired goal for each stressor or current issue identified.;Long Term Goal: Stressors or current issues are controlled or eliminated.;Short Term goal: Identification and review with participant of any Quality of Life or Depression concerns found by scoring the questionnaire.;Long Term goal: The participant improves quality of Life and PHQ9 Scores as seen by post scores and/or verbalization of changes             Quality of Life Scores:   Quality of Life - 03/07/21 1339       Quality of Life   Select Quality of Life      Quality of Life Scores   Health/Function Pre 14.07 %    Socioeconomic Pre 13.56 %    Psych/Spiritual Pre 12.43 %    Family Pre 18.7 %    GLOBAL Pre 14.29 %            Scores of 19 and below usually indicate a poorer quality of life in these areas.  A difference of  2-3 points is a clinically meaningful difference.  A difference of 2-3 points in the total score of the Quality of Life Index has been associated with significant improvement in overall quality of life, self-image, physical symptoms, and general health in studies assessing change in quality of  life.  PHQ-9: Recent Review Flowsheet Data     Depression screen [facility] 2/9 05/07/2021 04/04/2021 03/07/2021 03/14/2014 12/15/2013   Decreased Interest 0 1 1 0 0   Down, Depressed, Hopeless 0 _0 0   PHQ - 2 Score 0 _1 0   Altered sleeping 1 0 2 - -   Tired, decreased energy _2 - -   Change in appetite 0 0 0 - -   Feeling bad or failure about yourself  _3 - -   Trouble concentrating 0 0 0 - -   Moving slowly or fidgety/restless 0 0 0 - -   Suicidal thoughts 0 0 0 - -   PHQ-9 Score _4 - -   Difficult doing work/chores Not difficult at all Not difficult at all Not difficult at all - -      Interpretation of Total Score  Total Score Depression Severity:  1-4 = Minimal depression, 5-9 = Mild depression, 10-14 = Moderate depression, 15-19 = Moderately severe depression, 20-27 = Severe depression   Psychosocial Evaluation and Intervention:  Psychosocial Evaluation - 02/16/21 1028       Psychosocial Evaluation & Interventions   Interventions Encouraged to exercise with the program and follow exercise prescription    Comments Charbel has no barriers to attending the program. He lives with his wife of 64 years. Their son and daughter in law live close by and they talk with or see them several times a week. He does work some and enjoys working as it keeps him moving. He has completed Cardiac Rehab in the past and is looking forward to doing as much as he can to provide him guidance with his exercise. He has made several nutrition changes and is ready to review this with the RD. He does have some stress,financial,and some mild depression. He is on medication prescribed by his physician.  He does have some back problems and will let staff know if the exercise regimen increases his pain.  He shoudl do well with the program.    Expected Outcomes STG: Attending all scheduled sessions. Keeping stress and depression controlled. Learning and managing exercise/nutriton  goals  LTG: Ekansh will be  able to continue progress he gained in the program    Continue Psychosocial Services  Follow up required by staff             Psychosocial Re-Evaluation:  Psychosocial Re-Evaluation     [place] Name 03/14/21 0725 04/11/21 0734 05/07/21 0725 05/16/21 0739       Psychosocial Re-Evaluation   Current issues with Current Stress Concerns;Current Sleep Concerns Current Stress Concerns;Current Sleep Concerns Current Stress Concerns Current Stress Concerns    Comments Esias says he has high stress.  He exercises to help and works part time.  He also reads.  He has taken courses on mindfulness and uses meditation to lower BP.  He wears a CPAP and takes a medication to help with sleep. Jeancarlo is doing well mentally.  He is good in class and works hard.  He works three nights a week lifting, walking, and moving things.  His wife kept him up last night as she was having nightmares.  Normally, he sleeps well and is compliant with his CPAP.  We updated his PHQ last week and his score is improving as he is getting to feel better.  He was able to facetime with his grandkids over weekend and really enjoys that connection. Reviewed patient health questionnaire (PHQ-9) with patient for follow up. Previously, patients score indicated signs/symptoms of depression.  Reviewed to see if patient is improving symptom wise while in program.  Score improved and patient states that it is because he has been able to exercise more and be social. He is working his part time job and enjoys what he does. He is going to get a sleep study done, but otherwise thinks it is fine. He does have some tiredness during the day, but he takes naps. He reports his CPAP at night still, but he can't get the report and he needs a new one.  He reports having some financial stress and he relies on his wife for support. He enjoys watching movies and reading news.    Expected Outcomes Short:  continue to use all tools he has to help manage stress Long:  manage stress long term Short: Continue to cope with sleep habits.  Long: Conitnue to focus on the good things. Short: Continue to attend [facility] regularly for regular exercise and social engagement. Long: Continue to improve symptoms and manage a positive mental state. Short: Continue to attend [facility] regularly for regular exercise and social engagement. Long: Continue to improve symptoms and manage a positive mental state.    Interventions Stress management education;Relaxation education;Encouraged to attend Cardiac Rehabilitation for the exercise Stress management education;Relaxation education;Encouraged to attend Cardiac Rehabilitation for the exercise Encouraged to attend Cardiac Rehabilitation for the exercise Encouraged to attend Cardiac Rehabilitation for the exercise    Continue Psychosocial Services  -- Follow up required by staff No Follow up required No Follow up required      Initial Review   Source of Stress Concerns -- -- -- Financial    Comments -- -- -- Retired             Psychosocial Discharge (Final Psychosocial Re-Evaluation):  Psychosocial Re-Evaluation - 05/16/21 0739       Psychosocial Re-Evaluation   Current issues with Current Stress Concerns    Comments He is going to get a sleep study done, but otherwise thinks it is fine. He does have some tiredness during the day, but he takes naps. He reports his CPAP  at night still, but he can't get the report and he needs a new one.  He reports having some financial stress and he relies on his wife for support. He enjoys watching movies and reading news.    Expected Outcomes Short: Continue to attend [facility] regularly for regular exercise and social engagement. Long: Continue to improve symptoms and manage a positive mental state.    Interventions Encouraged to attend Cardiac Rehabilitation for the exercise    Continue Psychosocial Services  No Follow up required      Initial Review    Source of Stress Concerns Financial    Comments Retired             Vocational Rehabilitation: Provide vocational rehab assistance to qualifying candidates.   Vocational Rehab Evaluation & Intervention:  Vocational Rehab - 02/16/21 1016       Initial Vocational Rehab Evaluation & Intervention   Assessment shows need for Vocational Rehabilitation No      Discharge Vocational Rehab   Discharge Vocational Rehabilitation retired             Education: Education Goals: Education classes will be provided on a variety of topics geared toward better understanding of heart health and risk factor modification. Participant will state understanding/return demonstration of topics presented as noted by education test scores.  Learning Barriers/Preferences:   General Cardiac Education Topics:  AED/CPR: - Group verbal and written instruction with the use of models to demonstrate the basic use of the AED with the basic ABC's of resuscitation.   Anatomy and Cardiac Procedures: - Group verbal and visual presentation and models provide information about basic cardiac anatomy and function. Reviews the testing methods done to diagnose heart disease and the outcomes of the test results. Describes the treatment choices: Medical Management, Angioplasty, or Coronary Bypass Surgery for treating various heart conditions including Myocardial Infarction, Angina, Valve Disease, and Cardiac Arrhythmias.  Written material given at graduation.   Medication Safety: - Group verbal and visual instruction to review commonly prescribed medications for heart and lung disease. Reviews the medication, class of the drug, and side effects. Includes the steps to properly store meds and maintain the prescription regimen.  Written material given at graduation. Flowsheet Row Cardiac Rehab from 05/09/2021 in [facility] Cardiac and Pulmonary Rehab  Date 04/25/21  Educator SB  Instruction Review Code 1- Verbalizes  Understanding       Intimacy: - Group verbal instruction through game format to discuss how heart and lung disease can affect sexual intimacy. Written material given at graduation..   Know Your Numbers and Heart Failure: - Group verbal and visual instruction to discuss disease risk factors for cardiac and pulmonary disease and treatment options.  Reviews associated critical values for Overweight/Obesity, Hypertension, Cholesterol, and Diabetes.  Discusses basics of heart failure: signs/symptoms and treatments.  Introduces Heart Failure Zone chart for action plan for heart failure.  Written material given at graduation. Flowsheet Row Cardiac Rehab from 05/09/2021 in [facility] Cardiac and Pulmonary Rehab  Date 05/09/21  Educator SB  Instruction Review Code 1- Verbalizes Understanding       Infection Prevention: - Provides verbal and written material to individual with discussion of infection control including proper hand washing and proper equipment cleaning during exercise session. Flowsheet Row Cardiac Rehab from 05/09/2021 in [facility] Cardiac and Pulmonary Rehab  Date 03/07/21  Educator [facility]  Instruction Review Code 1- Verbalizes Understanding       Falls Prevention: - Provides verbal and written material to individual with  discussion of falls prevention and safety. Flowsheet Row Cardiac Rehab from 05/09/2021 in [facility] Cardiac and Pulmonary Rehab  Date 02/16/21  Educator SB  Instruction Review Code 1- Verbalizes Understanding       Other: -Provides group and verbal instruction on various topics (see comments)   Knowledge Questionnaire Score:  Knowledge Questionnaire Score - 03/07/21 1340       Knowledge Questionnaire Score   Pre Score 25/26             Core Components/Risk Factors/Patient Goals at Admission:  Personal Goals and Risk Factors at Admission - 03/07/21 1340       Core Components/Risk Factors/Patient Goals on Admission    Weight Management Yes;Obesity;Weight Loss     Intervention Weight Management: Develop a combined nutrition and exercise program designed to reach desired caloric intake, while maintaining appropriate intake of nutrient and fiber, sodium and fats, and appropriate energy expenditure required for the weight goal.;Obesity: Provide education and appropriate resources to help participant work on and attain dietary goals.;Weight Management: Provide education and appropriate resources to help participant work on and attain dietary goals.;Weight Management/Obesity: Establish reasonable short term and long term weight goals.    Admit Weight 194 lb 9.6 oz (88.3 kg)    Goal Weight: Short Term 190 lb (86.2 kg)    Goal Weight: Long Term 180 lb (81.6 kg)    Expected Outcomes Short Term: Continue to assess and modify interventions until short term weight is achieved;Long Term: Adherence to nutrition and physical activity/exercise program aimed toward attainment of established weight goal;Weight Loss: Understanding of general recommendations for a balanced deficit meal plan, which promotes 1-2 lb weight loss per week and includes a negative energy balance of [id number] kcal/d;Understanding recommendations for meals to include 15-35% energy as protein, 25-35% energy from fat, 35-60% energy from carbohydrates, less than 272m of dietary cholesterol, 20-35 gm of total fiber daily;Understanding of distribution of calorie intake throughout the day with the consumption of 4-5 meals/snacks    Hypertension Yes    Intervention Provide education on lifestyle modifcations including regular physical activity/exercise, weight management, moderate sodium restriction and increased consumption of fresh fruit, vegetables, and low fat dairy, alcohol moderation, and smoking cessation.;Monitor prescription use compliance.    Expected Outcomes Short Term: Continued assessment and intervention until BP is < 140/947mHG in hypertensive participants. < 130/8049mG in hypertensive participants  with diabetes, heart failure or chronic kidney disease.;Long Term: Maintenance of blood pressure at goal levels.    Lipids Yes    Intervention Provide education and support for participant on nutrition & aerobic/resistive exercise along with prescribed medications to achieve LDL <54m83mDL >40mg28m Expected Outcomes Short Term: Participant states understanding of desired cholesterol values and is compliant with medications prescribed. Participant is following exercise prescription and nutrition guidelines.;Long Term: Cholesterol controlled with medications as prescribed, with individualized exercise RX and with personalized nutrition plan. Value goals: LDL < 54mg,68m > 40 mg.             Education:Diabetes - Individual verbal and written instruction to review signs/symptoms of diabetes, desired ranges of glucose level fasting, after meals and with exercise. Acknowledge that pre and post exercise glucose checks will be done for 3 sessions at entry of program.   Core Components/Risk Factors/Patient Goals Review:   Goals and Risk Factor Review     Row Name 03/14/21 0723 04/11/21 0739 05/16/21 0735         Core Components/Risk Factors/Patient Goals Review  Personal Goals Review Hypertension Weight Management/Obesity;Hypertension;Lipids Weight Management/Obesity;Hypertension;Lipids     Review Wiatt reports taking all meds as directed.  He has started checking BP at home.  ususaly it runs high 140s - 80s.  He is having a kidney blood flow test.  BP 126/64 resting today. Oluwasemilore has been doing well in rehab.  His weight is staying steady.  He would like it to go down more, but his back continues to limit him.  His blood pressures are doing well now.  He is feeling better. They did a sonogram of his kidneys and blood flow was good.  He is getting better readings in class so that seems to have helped.  We encouraged him to bring in his cuff to compare. He has talked to doctor about it and they  mentioned that home cuffs do tend to read higher in general. Maison continues to do well in rehab, his weight continues to stay steady. He has recently had constipation due to increased hydrocodone (he has been taking these for a long time); he reports he is due for another catscan. His blood pressure continues to do well. This morning his BP was 128/64 even though he took medicine last night - he does not take his BP at home anymore since coming to rehab, still encouraged him to take it when not at rehab. He is taking medications as prescribed with no issues.     Expected Outcomes Short; follow up with tests  Long: continue to monitor BP Short: Continue to keep close eye on blood pressure Long: Continue to monitor risk factors Short: Continue to keep close eye on blood pressure Long: Continue to monitor risk factors              Core Components/Risk Factors/Patient Goals at Discharge (Final Review):   Goals and Risk Factor Review - 05/16/21 0735       Core Components/Risk Factors/Patient Goals Review   Personal Goals Review Weight Management/Obesity;Hypertension;Lipids    Review Kallin continues to do well in rehab, his weight continues to stay steady. He has recently had constipation due to increased hydrocodone (he has been taking these for a long time); he reports he is due for another catscan. His blood pressure continues to do well. This morning his BP was 128/64 even though he took medicine last night - he does not take his BP at home anymore since coming to rehab, still encouraged him to take it when not at rehab. He is taking medications as prescribed with no issues.    Expected Outcomes Short: Continue to keep close eye on blood pressure Long: Continue to monitor risk factors             ITP Comments:  ITP Comments     Row Name 02/16/21 1023 03/07/21 1315 03/09/21 0808 03/20/21 0804 04/04/21 0707   ITP Comments Virtual orientation call completed today. he has an appointment on  Date: 03/05/2021  for EP eval and gym Orientation.  Documentation of diagnosis can be found in [facility] Date: 02/12/21 . Completed 6MWT and gym orientation. Initial ITP created and sent for review to Dr. Emily Filbert, Medical Director. First full day of exercise!  Patient was oriented to gym and equipment including functions, settings, policies, and procedures.  Patient's individual exercise prescription and treatment plan were reviewed.  All starting workloads were established based on the results of the 6 minute walk test done at initial orientation visit.  The plan for exercise progression was also introduced  and progression will be customized based on patient's performance and goals. Completed initial RD consultation 30 Day review completed. Medical Director ITP review done, changes made as directed, and signed approval by Medical Director.    [place] Name 05/02/21 0841 05/30/21 0822         ITP Comments 30 Day review completed. Medical Director ITP review done, changes made as directed, and signed approval by Medical Director. 30 Day review completed. Medical Director ITP review done, changes made as directed, and signed approval by Medical Director.               Comments:

## 2021-06-04 ENCOUNTER — Encounter: Payer: Medicare Other | Admitting: *Deleted

## 2021-06-04 ENCOUNTER — Other Ambulatory Visit: Payer: Self-pay

## 2021-06-04 DIAGNOSIS — Z955 Presence of coronary angioplasty implant and graft: Secondary | ICD-10-CM

## 2021-06-04 NOTE — Progress Notes (Signed)
Daily Session Note  Patient Details  Name: Terry Cook MRN: [id number] Date of Birth: 01-10-1951 Referring Provider:   Flowsheet Row Cardiac Rehab from 03/07/2021 in [facility] Cardiac and Pulmonary Rehab  Referring Provider Kathlyn Sacramento MD       Encounter Date: 06/04/2021  Check In:  Session Check In - 06/04/21 0804       Check-In   Supervising physician immediately available to respond to emergencies See telemetry face sheet for immediately available ER MD    Location [facility]-Cardiac & Pulmonary Rehab    Staff Present Heath Lark, RN, BSN, CCRP;Joseph [place], RCP,RRT,BSRT;Kelly Pinnacle, [place], ACSM CEP, Exercise Physiologist    Virtual Visit No    Medication changes reported     No    Fall or balance concerns reported    No    Warm-up and Cool-down Performed on first and last piece of equipment    Resistance Training Performed Yes    VAD Patient? No    PAD/SET Patient? No      Pain Assessment   Currently in Pain? No/denies                Social History   Tobacco Use  Smoking Status Never  Smokeless Tobacco Never    Goals Met:  Independence with exercise equipment Exercise tolerated well No report of concerns or symptoms today  Goals Unmet:  Not Applicable  Comments: Pt able to follow exercise prescription today without complaint.  Will continue to monitor for progression.    Dr. Emily Filbert is Medical Director for [place].  Dr. Ottie Glazier is Medical Director for [facility] Pulmonary Rehabilitation.

## 2021-06-06 ENCOUNTER — Other Ambulatory Visit: Payer: Self-pay

## 2021-06-06 ENCOUNTER — Encounter: Payer: Medicare Other | Attending: Cardiovascular Disease

## 2021-06-06 DIAGNOSIS — Z955 Presence of coronary angioplasty implant and graft: Secondary | ICD-10-CM | POA: Diagnosis not present

## 2021-06-06 NOTE — Progress Notes (Signed)
Daily Session Note ? ?Patient Details  ?Name: Terry Cook ?MRN: [id number] ?Date of Birth: September 11, 1950 ?Referring Provider:   ?Flowsheet Row Cardiac Rehab from 03/07/2021 in [facility] Cardiac and Pulmonary Rehab  ?Referring Provider Kathlyn Sacramento MD  ? ?  ? ? ?Encounter Date: 06/06/2021 ? ?Check In: ? Session Check In - 06/06/21 0730   ? ?  ? Check-In  ? Supervising physician immediately available to respond to emergencies See telemetry face sheet for immediately available ER MD   ? Location [facility]-Cardiac & Pulmonary Rehab   ? Staff Present Birdie Sons, MPA, RN;Amanda Sommer, BA, ACSM CEP, Exercise Physiologist;Joseph [place], [place]   ? Virtual Visit No   ? Medication changes reported     No   ? Fall or balance concerns reported    No   ? Warm-up and Cool-down Performed on first and last piece of equipment   ? Resistance Training Performed Yes   ? VAD Patient? No   ? PAD/SET Patient? No   ?  ? Pain Assessment  ? Currently in Pain? No/denies   ? ?  ?  ? ?  ? ? ? ? ? ?Social History  ? ?Tobacco Use  ?Smoking Status Never  ?Smokeless Tobacco Never  ? ? ?Goals Met:  ?Independence with exercise equipment ?Exercise tolerated well ?No report of concerns or symptoms today ?Strength training completed today ? ?Goals Unmet:  ?Not Applicable ? ?Comments: Pt able to follow exercise prescription today without complaint.  Will continue to monitor for progression. ? ? ? ?Dr. Emily Filbert is Medical Director for [place].  ?Dr. Ottie Glazier is Medical Director for [facility] Pulmonary Rehabilitation. ?

## 2021-06-08 ENCOUNTER — Encounter: Payer: Medicare Other | Admitting: *Deleted

## 2021-06-08 ENCOUNTER — Other Ambulatory Visit: Payer: Self-pay

## 2021-06-08 DIAGNOSIS — Z955 Presence of coronary angioplasty implant and graft: Secondary | ICD-10-CM | POA: Diagnosis not present

## 2021-06-08 NOTE — Progress Notes (Signed)
Daily Session Note ? ?Patient Details  ?Name: Terry Cook ?MRN: [id number] ?Date of Birth: 06-20-1950 ?Referring Provider:   ?Flowsheet Row Cardiac Rehab from 03/07/2021 in [facility] Cardiac and Pulmonary Rehab  ?Referring Provider Terry Sacramento MD  ? ?  ? ? ?Encounter Date: 06/08/2021 ? ?Check In: ? Session Check In - 06/08/21 0732   ? ?  ? Check-In  ? Supervising physician immediately available to respond to emergencies See telemetry face sheet for immediately available ER MD   ? Location [facility]-Cardiac & Pulmonary Rehab   ? Staff Present Terry Lark, RN, BSN, CCRP;Terry Cook, [place], [place], [place], [place], CCET   ? Virtual Visit No   ? Medication changes reported     No   ? Fall or balance concerns reported    No   ? Warm-up and Cool-down Performed on first and last piece of equipment   ? Resistance Training Performed Yes   ? VAD Patient? No   ? PAD/SET Patient? No   ?  ? Pain Assessment  ? Currently in Pain? No/denies   ? ?  ?  ? ?  ? ? ? ? ? ?Social History  ? ?Tobacco Use  ?Smoking Status Never  ?Smokeless Tobacco Never  ? ? ?Goals Met:  ?Independence with exercise equipment ?Exercise tolerated well ?No report of concerns or symptoms today ? ?Goals Unmet:  ?Not Applicable ? ?Comments:  [name] graduated today from  rehab with 36 sessions completed.  Details of the patient's exercise prescription and what He needs to do in order to continue the prescription and progress were discussed with patient.  Patient was given a copy of prescription and goals.  Patient verbalized understanding.  Terry Cook plans to continue to exercise by using the gym where he lives. ? ? ?Dr. Emily Filbert is Medical Director for [place].  ?Dr. Ottie Glazier is Medical Director for [facility] Pulmonary Rehabilitation. ?

## 2021-06-08 NOTE — Progress Notes (Signed)
Cardiac Individual Treatment Plan  Patient Details  Name: Terry Cook MRN: [id number] Date of Birth: [birth year] Referring Provider:   Flowsheet Row Cardiac Rehab from 03/07/2021 in [facility] Cardiac and Pulmonary Rehab  Referring Provider Kathlyn Sacramento MD       Initial Encounter Date:  Flowsheet Row Cardiac Rehab from 03/07/2021 in [facility] Cardiac and Pulmonary Rehab  Date 03/07/21       Visit Diagnosis: Status post coronary artery stent placement  Patient's Home Medications on Admission:  Current Outpatient Medications:    acetaminophen (TYLENOL) 500 MG tablet, Take 1,000 mg by mouth every 8 (eight) hours as needed for mild pain or moderate pain., Disp: , Rfl:    ALPRAZolam (XANAX) 0.5 MG tablet, Take 0.5 mg by mouth 2 (two) times daily as needed for anxiety., Disp: , Rfl:    amLODipine (NORVASC) 10 MG tablet, Take 1 tablet (10 mg total) by mouth daily., Disp: 90 tablet, Rfl: 1   aspirin EC 81 MG tablet, Take 81 mg by mouth daily., Disp: , Rfl:    atorvastatin (LIPITOR) 80 MG tablet, Take 1 tablet (80 mg total) by mouth every evening., Disp: 90 tablet, Rfl: 1   clopidogrel (PLAVIX) 75 MG tablet, Take 1 tablet (75 mg total) by mouth daily., Disp: 90 tablet, Rfl: 3   diclofenac sodium (VOLTAREN) 1 % GEL, Apply 1 application topically 3 (three) times daily. Apply to feet, Disp: , Rfl:    ezetimibe (ZETIA) 10 MG tablet, Take 1 tablet (10 mg total) by mouth daily., Disp: 90 tablet, Rfl: 3   hydrOXYzine (VISTARIL) 25 MG capsule, Take 50 mg by mouth every 8 (eight) hours as needed., Disp: , Rfl:    losartan-hydrochlorothiazide (HYZAAR) 100-25 MG tablet, Take 1 tablet by mouth daily., Disp: , Rfl:    naloxone (NARCAN) nasal spray 4 mg/0.1 mL, SMARTSIG:Both Nares Once a Week, Disp: , Rfl:    Oxycodone HCl 10 MG TABS, Take 10 mg by mouth 3 (three) times daily., Disp: , Rfl:    polycarbophil (FIBERCON) 625 MG tablet, Take 1,250 mg by mouth daily., Disp: , Rfl:    sertraline (ZOLOFT) 25 MG  tablet, Take 75 mg by mouth daily., Disp: , Rfl:    silodosin (RAPAFLO) 8 MG CAPS capsule, Take 1 capsule (8 mg total) by mouth daily with breakfast., Disp: 90 capsule, Rfl: 3   zolpidem (AMBIEN) 10 MG tablet, Take 10 mg by mouth at bedtime., Disp: , Rfl:   Past Medical History: Past Medical History:  Diagnosis Date   Anxiety    CAD (coronary artery disease), native coronary artery 06/04/2011   s/p MI with PCI to left circ   Chronic lower back pain    DDD (degenerative disc disease), lumbar    Depression    High cholesterol    History of kidney stones    Hypertension    Irregular heart beats    MI (myocardial infarction) ([place]) 05/2011   OSA on CPAP     Tobacco Use: Social History   Tobacco Use  Smoking Status Never  Smokeless Tobacco Never    Labs: Recent Review Flowsheet Data     Labs for ITP Cardiac and Pulmonary Rehab Latest Ref Rng & Units 06/03/2011 06/04/2011 11/18/2013 02/26/2021 04/24/2021   Cholestrol 100 - 199 mg/dL - 133 139 141 112   LDLCALC 0 - 99 mg/dL - 69 63 81 56   LDLDIRECT 0 - 99 mg/dL - - - 82 59   HDL >39 mg/dL - 35(L) 38(L)  32(L) 30(L)   Trlycerides 0 - 149 mg/dL - 147 190(H) 163(H) 147   TCO2 0 - 100 mmol/L 32 - - - -        Exercise Target Goals: Exercise Program Goal: Individual exercise prescription set using results from initial 6 min walk test and THRR while considering  patients activity barriers and safety.   Exercise Prescription Goal: Initial exercise prescription builds to 30-45 minutes a day of aerobic activity, 2-3 days per week.  Home exercise guidelines will be given to patient during program as part of exercise prescription that the participant will acknowledge.   Education: Aerobic Exercise: - Group verbal and visual presentation on the components of exercise prescription. Introduces F.I.T.T principle from ACSM for exercise prescriptions.  Reviews F.I.T.T. principles of aerobic exercise including progression. Written material  given at graduation.   Education: Resistance Exercise: - Group verbal and visual presentation on the components of exercise prescription. Introduces F.I.T.T principle from ACSM for exercise prescriptions  Reviews F.I.T.T. principles of resistance exercise including progression. Written material given at graduation.    Education: Exercise & Equipment Safety: - Individual verbal instruction and demonstration of equipment use and safety with use of the equipment. Flowsheet Row Cardiac Rehab from 05/09/2021 in [facility] Cardiac and Pulmonary Rehab  Date 03/07/21  Educator [facility]  Instruction Review Code 1- Verbalizes Understanding       Education: Exercise Physiology & General Exercise Guidelines: - Group verbal and written instruction with models to review the exercise physiology of the cardiovascular system and associated critical values. Provides general exercise guidelines with specific guidelines to those with heart or lung disease.  Flowsheet Row Cardiac Rehab from 05/09/2021 in [facility] Cardiac and Pulmonary Rehab  Date 03/28/21  Educator [facility]  Instruction Review Code 1- Verbalizes Understanding       Education: Flexibility, Balance, Mind/Body Relaxation: - Group verbal and visual presentation with interactive activity on the components of exercise prescription. Introduces F.I.T.T principle from ACSM for exercise prescriptions. Reviews F.I.T.T. principles of flexibility and balance exercise training including progression. Also discusses the mind body connection.  Reviews various relaxation techniques to help reduce and manage stress (i.e. Deep breathing, progressive muscle relaxation, and visualization). Balance handout provided to take home. Written material given at graduation.   Activity Barriers & Risk Stratification:  Activity Barriers & Cardiac Risk Stratification - 03/07/21 1317       Activity Barriers & Cardiac Risk Stratification   Activity Barriers Arthritis;Back Problems;Neck/Spine  Problems;Deconditioning;Muscular Weakness;Shortness of Breath;Balance Concerns   cervical stenosis, chronic low back pain with DDD   Cardiac Risk Stratification Moderate             6 Minute Walk:  6 Minute Walk     Row Name 03/07/21 1316 05/21/21 0738       6 Minute Walk   Phase Initial Discharge    Distance 1360 feet 1435 feet    Distance % Change -- 6 %    Distance Feet Change -- 75 ft    Walk Time 6 minutes 6 minutes    # of Rest Breaks 0 0    MPH 2.58 2.72    METS 2.88 2.69    RPE 8 11    VO2 Peak 10.07 9.4    Symptoms Yes (comment) No    Comments tail bone/back pain 1/10, burning in thighs, SOB --    Resting HR 65 bpm 60 bpm    Resting BP 144/86 128/62    Resting Oxygen Saturation  96 %  95 %    Exercise Oxygen Saturation  during 6 min walk 96 % 94 %    Max Ex. HR 90 bpm 69 bpm    Max Ex. BP 158/86 142/70    2 Minute Post BP -- 130/80             Oxygen Initial Assessment:   Oxygen Re-Evaluation:   Oxygen Discharge (Final Oxygen Re-Evaluation):   Initial Exercise Prescription:  Initial Exercise Prescription - 03/07/21 1300       Date of Initial Exercise RX and Referring Provider   Date 03/07/21    Referring Provider Kathlyn Sacramento MD      Oxygen   Maintain Oxygen Saturation 88% or higher      Treadmill   MPH 2.3    Grade 0.5    Minutes 15    METs 2.76      Recumbant Bike   Level 2    RPM 50    Watts 24    Minutes 15    METs 2.8      REL-XR   Level 2    Speed 50    Minutes 15    METs 2.8      Track   Laps 35    Minutes 15    METs 2.9      Prescription Details   Frequency (times per week) 3    Duration Progress to 30 minutes of continuous aerobic without signs/symptoms of physical distress      Intensity   THRR 40-80% of Max Heartrate 99-133    Ratings of Perceived Exertion 11-13    Perceived Dyspnea 0-4      Progression   Progression Continue to progress workloads to maintain intensity without signs/symptoms of  physical distress.      Resistance Training   Training Prescription Yes    Weight 4  lb    Reps 10-15             Perform Capillary Blood Glucose checks as needed.  Exercise Prescription Changes:   Exercise Prescription Changes     Row Name 03/07/21 1300 03/12/21 1000 03/26/21 1700 04/10/21 1500 04/23/21 1300     Response to Exercise   Blood Pressure (Admit) 144/86 124/56 126/78 102/60 136/90   Blood Pressure (Exercise) 158/86 150/60 -- -- --   Blood Pressure (Exit) 124/58 122/60 128/64 112/60 118/60   Heart Rate (Admit) 65 bpm 75 bpm 80 bpm 74 bpm 93 bpm   Heart Rate (Exercise) 90 bpm 102 bpm 101 bpm 120 bpm 115 bpm   Heart Rate (Exit) 70 bpm 68 bpm 78 bpm 73 bpm 66 bpm   Oxygen Saturation (Admit) 96 % -- -- -- --   Oxygen Saturation (Exercise) 96 % -- -- -- --   Rating of Perceived Exertion (Exercise) _0 Symptoms tail bone/back pain 1/10, thighs burning, SOB none none back pain on elliptical second try --   Comments walk test results 1st full day of exercise -- -- --   Duration -- Progress to 30 minutes of  aerobic without signs/symptoms of physical distress Progress to 30 minutes of  aerobic without signs/symptoms of physical distress Continue with 30 min of aerobic exercise without signs/symptoms of physical distress. Continue with 30 min of aerobic exercise without signs/symptoms of physical distress.   Intensity -- THRR unchanged THRR unchanged THRR unchanged THRR unchanged     Progression   Progression -- Continue to progress workloads to maintain intensity  without signs/symptoms of physical distress. Continue to progress workloads to maintain intensity without signs/symptoms of physical distress. Continue to progress workloads to maintain intensity without signs/symptoms of physical distress. Continue to progress workloads to maintain intensity without signs/symptoms of physical distress.   Average METs -- 2.97 5.25 3.78 3.5     Resistance Training    Training Prescription -- Yes Yes Yes Yes   Weight -- 4 lb 6 lb 8 lb 6 lb   Reps -- 10-15 10-15 10-15 10-15     Interval Training   Interval Training -- No No No No     Treadmill   MPH -- 2.5 -- 2.7 --   Grade -- 0.5 -- 1 --   Minutes -- 15 -- 15 --   METs -- 3.09 -- 3.44 --     Recumbant Bike   Level -- 2 -- -- 4   Watts -- 25 -- -- --   Minutes -- 15 -- -- 15   METs -- 2.86 -- -- 2.85     NuStep   Level -- -- _0 Minutes -- -- _1 METs -- -- 5.7 5.7 4.1     Arm Ergometer   Level -- -- -- 1.5 --   Minutes -- -- -- 15 --   METs -- -- -- 2.7 --     Elliptical   Level -- -- -- 1 --   Speed -- -- -- 2 --   Minutes -- -- -- 15 --   METs -- -- -- 2.2 --     REL-XR   Level -- -- 4 4 --   Minutes -- -- 15 15 --   METs -- -- 4.8 4.8 --     Oxygen   Maintain Oxygen Saturation -- -- -- 88% or higher 88% or higher    Row Name 05/07/21 1000 05/21/21 1600 06/04/21 1300         Response to Exercise   Blood Pressure (Admit) 148/78 128/62 124/70     Blood Pressure (Exercise) -- 142/70 --     Blood Pressure (Exit) 122/60 128/64 108/66     Heart Rate (Admit) 79 bpm 60 bpm 59 bpm     Heart Rate (Exercise) 93 bpm 64 bpm 92 bpm     Heart Rate (Exit) 84 bpm 74 bpm 74 bpm     Oxygen Saturation (Admit) -- -- 94 %     Oxygen Saturation (Exercise) -- -- 93 %     Oxygen Saturation (Exit) -- -- 96 %     Rating of Perceived Exertion (Exercise) _2 Duration Continue with 30 min of aerobic exercise without signs/symptoms of physical distress. Continue with 30 min of aerobic exercise without signs/symptoms of physical distress. Continue with 30 min of aerobic exercise without signs/symptoms of physical distress.     Intensity THRR unchanged THRR unchanged THRR unchanged       Progression   Progression -- Continue to progress workloads to maintain intensity without signs/symptoms of physical distress. Continue to progress workloads to maintain intensity without  signs/symptoms of physical distress.     Average METs -- 4.9 5       Resistance Training   Training Prescription Yes Yes Yes     Weight 7 lb 7 lb 6 lb     Reps 10-15 10-15 10-15       Interval Training   Interval Training No No No  Recumbant Bike   Level 4.5 -- --     Watts 24 -- --     Minutes 15 -- --     METs 2.85 -- --       NuStep   Level _0 Minutes _1 METs 4.6 -- 5       REL-XR   Level 8 -- --     Minutes 15 -- --     METs 4.2 -- --       Biostep-RELP   Level 10 -- --     Minutes 15 -- --     METs 4 -- --       Home Exercise Plan   Plans to continue exercise at -- -- Home (comment)  walking, bike in complex gym, join gym     Frequency -- -- Add 2 additional days to program exercise sessions.     Initial Home Exercises Provided -- -- 03/24/21       Oxygen   Maintain Oxygen Saturation 88% or higher 88% or higher 88% or higher              Exercise Comments:   Exercise Comments     Row Name 03/09/21 4627 06/08/21 0736         Exercise Comments First full day of exercise!  Patient was oriented to gym and equipment including functions, settings, policies, and procedures.  Patient's individual exercise prescription and treatment plan were reviewed.  All starting workloads were established based on the results of the 6 minute walk test done at initial orientation visit.  The plan for exercise progression was also introduced and progression will be customized based on patient's performance and goals. Sam graduated today from  rehab with 36 sessions completed.  Details of the patient's exercise prescription and what He needs to do in order to continue the prescription and progress were discussed with patient.  Patient was given a copy of prescription and goals.  Patient verbalized understanding.  Salbador plans to continue to exercise by using the gym where he lives.               Exercise Goals and Review:   Exercise Goals     Row  Name 03/07/21 1338             Exercise Goals   Increase Physical Activity Yes       Intervention Provide advice, education, support and counseling about physical activity/exercise needs.;Develop an individualized exercise prescription for aerobic and resistive training based on initial evaluation findings, risk stratification, comorbidities and participant's personal goals.       Expected Outcomes Short Term: Attend rehab on a regular basis to increase amount of physical activity.;Long Term: Add in home exercise to make exercise part of routine and to increase amount of physical activity.;Long Term: Exercising regularly at least 3-5 days a week.       Increase Strength and Stamina Yes       Intervention Provide advice, education, support and counseling about physical activity/exercise needs.;Develop an individualized exercise prescription for aerobic and resistive training based on initial evaluation findings, risk stratification, comorbidities and participant's personal goals.       Expected Outcomes Short Term: Increase workloads from initial exercise prescription for resistance, speed, and METs.;Long Term: Improve cardiorespiratory fitness, muscular endurance and strength as measured by increased METs and functional capacity (6MWT);Short Term: Perform resistance training exercises routinely during rehab and  add in resistance training at home       Able to understand and use rate of perceived exertion (RPE) scale Yes       Intervention Provide education and explanation on how to use RPE scale       Expected Outcomes Short Term: Able to use RPE daily in rehab to express subjective intensity level;Long Term:  Able to use RPE to guide intensity level when exercising independently       Able to understand and use Dyspnea scale Yes       Intervention Provide education and explanation on how to use Dyspnea scale       Expected Outcomes Short Term: Able to use Dyspnea scale daily in rehab to express  subjective sense of shortness of breath during exertion;Long Term: Able to use Dyspnea scale to guide intensity level when exercising independently       Knowledge and understanding of Target Heart Rate Range (THRR) Yes       Intervention Provide education and explanation of THRR including how the numbers were predicted and where they are located for reference       Expected Outcomes Short Term: Able to state/look up THRR;Long Term: Able to use THRR to govern intensity when exercising independently;Short Term: Able to use daily as guideline for intensity in rehab       Able to check pulse independently Yes       Intervention Provide education and demonstration on how to check pulse in carotid and radial arteries.;Review the importance of being able to check your own pulse for safety during independent exercise       Expected Outcomes Short Term: Able to explain why pulse checking is important during independent exercise;Long Term: Able to check pulse independently and accurately       Understanding of Exercise Prescription Yes       Intervention Provide education, explanation, and written materials on patient's individual exercise prescription       Expected Outcomes Short Term: Able to explain program exercise prescription;Long Term: Able to explain home exercise prescription to exercise independently                Exercise Goals Re-Evaluation :  Exercise Goals Re-Evaluation     Row Name 03/09/21 0809 03/12/21 1042 03/14/21 0747 03/26/21 1736 04/10/21 1540     Exercise Goal Re-Evaluation   Exercise Goals Review Able to understand and use rate of perceived exertion (RPE) scale;Knowledge and understanding of Target Heart Rate Range (THRR);Understanding of Exercise Prescription;Able to understand and use Dyspnea scale Increase Physical Activity;Increase Strength and Stamina Increase Physical Activity;Increase Strength and Stamina Increase Physical Activity;Increase Strength and Stamina Increase  Physical Activity;Increase Strength and Stamina;Understanding of Exercise Prescription   Comments Reviewed RPE and dyspnea scales, THR and program prescription with pt today.  Pt voiced understanding and was given a copy of goals to take home. Rigley did well his first full session of exercise at rehab. He tolerated his exercise prescription well and even increased to 2.5 speed on the treadmill. His RPEs were appropriate. Will continue to monitor as he progresses through the program. Reviewed home exercise with pt today.  Pt plans to use gym where he lives for exercise.  Reviewed THR, pulse, RPE, sign and symptoms, pulse oximetery and when to call 911 or MD.  Also discussed weather considerations and indoor options.  Pt voiced understanding. Philippe is progressing well and has increase average MET level to 5.25.  he has also increased to  6 lb for strength work. Demetries is doing well in rehab.   He is now up to 8 lb hand weights and has tried out the elliptical some along with arm crank.  We will continue to monitor his progress.   Expected Outcomes Short: Use RPE daily to regulate intensity. Long: Follow program prescription in THR. Short: Continue current exercise prescription and increase when appropriate Long: Gradually increase overall MET level Short: monitor HR when exercising on his own Long: maintain exercise independently Short: continue to attend consistently Long: continue to build stamina Short: Continue to increase stamina on elliptical Long: Continue to improve stamina.    [place] Name 04/11/21 0731 04/23/21 1340 05/07/21 1054 05/16/21 0754 05/21/21 1641     Exercise Goal Re-Evaluation   Exercise Goals Review Increase Physical Activity;Increase Strength and Stamina;Understanding of Exercise Prescription Increase Physical Activity;Increase Strength and Stamina Increase Physical Activity;Increase Strength and Stamina Increase Physical Activity;Increase Strength and Stamina Increase Physical  Activity;Increase Strength and Stamina   Comments Brendan is doing well in rehab.  His back continues to be a limitation.  He has a pinched nerve and see the doctor again next week for it.  This has been going on for about 35 years. He stays active at work at least 3 days a week.  At work he lifts and moves boxes frequently. He has not really been doing much cardio. Baylen has been doing seated machines and not walking to help his back pain resolve some.  Hopefuly this will get better and he can continue to exercise. We will continue to monitor. Eivan is doing well in rehab. He has increased to level 8 on the XR as well as increased his handweights to 7 lbs. He is not quite hitting his [place] but his RPEs are in appropriate range. Will continue to monitor. Quamel reports having an active job and has chronic pain. He has a gym where he lives and thinks they have a recumbant bike - he has silver sneakers so if not he was thinking of [employer] or [employer]. Lavontae has done well in rehab and will complete the program soon.  His chronic back pain makes walking difficult.   Expected Outcomes Short: Talk to doctor about back Long: conitnue to improve stamina. Short: try walking when back pain is better Long: exercise with less back pain Short: Continue building up tolerance with walking when ready Long: Continue to increase overall MET level Short: Continue building up tolerance with walking when ready Long: Continue to increase overall MET level Short: complete HT Long: maintain exercise independently    Row Name 06/04/21 1337             Exercise Goal Re-Evaluation   Exercise Goals Review Increase Physical Activity;Increase Strength and Stamina;Understanding of Exercise Prescription       Comments Zebbie graduates this week!!  He is planning to continue to exercise by joining a gym with his [place].       Expected Outcomes Short: Graduate Long: Continue to exercise independently                 Discharge Exercise Prescription (Final Exercise Prescription Changes):  Exercise Prescription Changes - 06/04/21 1300       Response to Exercise   Blood Pressure (Admit) 124/70    Blood Pressure (Exit) 108/66    Heart Rate (Admit) 59 bpm    Heart Rate (Exercise) 92 bpm    Heart Rate (Exit) 74 bpm    Oxygen  Saturation (Admit) 94 %    Oxygen Saturation (Exercise) 93 %    Oxygen Saturation (Exit) 96 %    Rating of Perceived Exertion (Exercise) 16    Duration Continue with 30 min of aerobic exercise without signs/symptoms of physical distress.    Intensity THRR unchanged      Progression   Progression Continue to progress workloads to maintain intensity without signs/symptoms of physical distress.    Average METs 5      Resistance Training   Training Prescription Yes    Weight 6 lb    Reps 10-15      Interval Training   Interval Training No      NuStep   Level 8    Minutes 30    METs 5      Home Exercise Plan   Plans to continue exercise at Home (comment)   walking, bike in complex gym, join gym   Frequency Add 2 additional days to program exercise sessions.    Initial Home Exercises Provided 03/24/21      Oxygen   Maintain Oxygen Saturation 88% or higher             Nutrition:  Target Goals: Understanding of nutrition guidelines, daily intake of sodium <1522m, cholesterol <2014m calories 30% from fat and 7% or less from saturated fats, daily to have 5 or more servings of fruits and vegetables.  Education: All About Nutrition: -Group instruction provided by verbal, written material, interactive activities, discussions, models, and posters to present general guidelines for heart healthy nutrition including fat, fiber, MyPlate, the role of sodium in heart healthy nutrition, utilization of the nutrition label, and utilization of this knowledge for meal planning. Follow up email sent as well. Written material given at graduation. Flowsheet [facility]  [facility]  Education need identified 03/07/21       Biometrics:  Pre Biometrics - 03/07/21 1339       Pre Biometrics   Height 5' 6.5" (1.689 m)    Weight 194 lb 9.6 oz (88.3 kg)    BMI (Calculated) 30.94    Single Leg Stand 4.8 seconds             Post Biometrics - 05/21/21 0740        Post  Biometrics   Height 5' 6.5" (1.689 m)    Weight 194 lb (88 kg)    BMI (Calculated) 30.85    Single Leg Stand 12.69 seconds             Nutrition Therapy Plan and Nutrition Goals:  Nutrition Therapy & Goals - 03/20/21 0728       Nutrition Therapy   Diet Heart Healthy Low Na    Drug/Food Interactions Statins/Certain Fruits    Protein (specify units) 70g    Fiber 30 grams    Whole Grain Foods 3 servings    Saturated Fats 12 max. grams    Fruits and Vegetables 8 servings/day    Sodium 1.5 grams      Personal Nutrition Goals   Nutrition Goal ST: Discussed introducing more vegetables instead of some of the servings of fruit, introduce one new vegetable per week, and pairing some fruit servings with protein and fat to better balance snacks (yogurt with fruit, nuts/seeds or nut/seed butter with fruit) LT: increase variety, reduce overal fruit servings by at least 2, follow MyPlate guidelines for meals and the fiber, protein, fat combination for snacks.  Comments 71 y.o. M admitted to rehab s/p stent placement also presents with HTN, CAD, HLD. PMH includes previous MI. Relevant medications include lipitor, xanax, zoloft, ambien, fibercon, oxycodone. PYP 69. He repots having many heart healthy education sessions in his life as well as reading articles and feels he does not need more nutrition education at this time. He has no "junk" or fried foods, very little red meat, he uses olive oil mixed with lemon juice and some spices. He eats about 4 bananas, 4 oranges, and 3 apples per day. He eats broccoli and stringbeans - he feels he should get more, 1  baked potato per week. He reports not picking up a salt shaker for 40 years.  He will have yogurt with fresh blueberries during the day to help with hunger. B: scrambled egg (with some olive oil based margarine) 2x/week or fruit L: [place] sandwich with mustard or peanut butter sandwich (whole wheat). D: sometimes bowl of cereal with low fat milk, Last night he had salmon with no butter and spices as well as a salad with olive oil and vinegar. Drinks: water 5-6 glasses. His constipation is alliviated by the fibercon he takes daily. Reviewed general heart healthy eating, balance in meals, and provided nutrition handout for him to review. Discussed introducing more vegetables instead of some of the servings of fruit, increasing variety, and pairing some fruit servings with protein and fat to better balance snacks (yogurt with fruit, nuts/seeds or nut/seed butter with fruit)      Intervention Plan   Intervention Prescribe, educate and counsel regarding individualized specific dietary modifications aiming towards targeted core components such as weight, hypertension, lipid management, diabetes, heart failure and other comorbidities.    Expected Outcomes Short Term Goal: Understand basic principles of dietary content, such as calories, fat, sodium, cholesterol and nutrients.;Short Term Goal: A plan has been developed with personal nutrition goals set during dietitian appointment.;Long Term Goal: Adherence to prescribed nutrition plan.             Nutrition Assessments:  MEDIFICTS Score Key: ?70 Need to make dietary changes  40-70 Heart Healthy Diet ? 40 Therapeutic Level Cholesterol Diet  Flowsheet Row Cardiac Rehab from 03/07/2021 in [facility] Cardiac and Pulmonary Rehab  Picture Your Plate Total Score on Admission 69      Picture Your Plate Scores: <99 Unhealthy dietary pattern with much room for improvement. 41-50 Dietary pattern unlikely to meet recommendations for good health and room for  improvement. 51-60 More healthful dietary pattern, with some room for improvement.  >60 Healthy dietary pattern, although there may be some specific behaviors that could be improved.    Nutrition Goals Re-Evaluation:  Nutrition Goals Re-Evaluation     Row Name 03/14/21 0724 04/11/21 0737 05/16/21 0727         Goals   Nutrition Goal -- ST: Discussed introducing more vegetables instead of some of the servings of fruit, introduce one new vegetable per week, and pairing some fruit servings with protein and fat to better balance snacks (yogurt with fruit, nuts/seeds or nut/seed butter with fruit) LT: increase variety, reduce overal fruit servings by at least 2, follow MyPlate guidelines for meals and the fiber, protein, fat combination for snacks. ST: Discussed introducing more vegetables instead of some of the servings of fruit, introduce one new vegetable per week, and pairing some fruit servings with protein and fat to better balance snacks (yogurt with fruit, nuts/seeds or nut/seed butter with fruit) LT: increase variety, reduce overall fruit servings  by at least 2, follow MyPlate guidelines for meals and the fiber, protein, fat combination for snacks.     Comment Nutrition appointment scheduled for 12/12 after class. Dino is doing well with his diet.  He says he "eats like a squirrel"!  He has lots of fruits.  He stays away from red meat and limits himself to 3 eggs a week.  He is doing well with variety.  He is also trying to get fish at least once a week. He is also getting baked chicken.  He aims to get is some vegetables. Jivan reports including whole grains like oatmeal with fruit like blueberries and banana and glass of OJ, sometimes he will have eggs with OJ and fruit. He continues to not eat lunch. This week he has had stoffers fried apple and a hot dog, grilled cheese (whole wheat with olive oil or margarine), peanut butter sandwich, sandwich with roast beef or [place], grilled salmon and  salad. He eats strawberry or vamilla yogurt 1/2 one per day with fruit. Encourged to include more vegetables: raw with dips like hummus, tzatiki, or tahini and roasted vegetables as options. He reports when he works he will eat 3-4 hours in a 5 hour shift, discussed adding in peanut butter to make that more balanced.     Expected Outcome -- Short: Continue with good variety and ensure enough protein Long: Continue to aim for heart healthy diet. ST: Discussed introducing more vegetables instead of some of the servings of fruit, introduce one new vegetable per week, and pairing some fruit servings with protein and fat to better balance snacks (yogurt with fruit, nuts/seeds or nut/seed butter with fruit) LT: increase variety, reduce overall fruit servings by at least 2, follow MyPlate guidelines for meals and the fiber, protein, fat combination for snacks.              Nutrition Goals Discharge (Final Nutrition Goals Re-Evaluation):  Nutrition Goals Re-Evaluation - 05/16/21 0727       Goals   Nutrition Goal ST: Discussed introducing more vegetables instead of some of the servings of fruit, introduce one new vegetable per week, and pairing some fruit servings with protein and fat to better balance snacks (yogurt with fruit, nuts/seeds or nut/seed butter with fruit) LT: increase variety, reduce overall fruit servings by at least 2, follow MyPlate guidelines for meals and the fiber, protein, fat combination for snacks.    Comment Lamonte reports including whole grains like oatmeal with fruit like blueberries and banana and glass of OJ, sometimes he will have eggs with OJ and fruit. He continues to not eat lunch. This week he has had stoffers fried apple and a hot dog, grilled cheese (whole wheat with olive oil or margarine), peanut butter sandwich, sandwich with roast beef or [place], grilled salmon and salad. He eats strawberry or vamilla yogurt 1/2 one per day with fruit. Encourged to include more  vegetables: raw with dips like hummus, tzatiki, or tahini and roasted vegetables as options. He reports when he works he will eat 3-4 hours in a 5 hour shift, discussed adding in peanut butter to make that more balanced.    Expected Outcome ST: Discussed introducing more vegetables instead of some of the servings of fruit, introduce one new vegetable per week, and pairing some fruit servings with protein and fat to better balance snacks (yogurt with fruit, nuts/seeds or nut/seed butter with fruit) LT: increase variety, reduce overall fruit servings by at least 2, follow MyPlate guidelines for  meals and the fiber, protein, fat combination for snacks.             Psychosocial: Target Goals: Acknowledge presence or absence of significant depression and/or stress, maximize coping skills, provide positive support system. Participant is able to verbalize types and ability to use techniques and skills needed for reducing stress and depression.   Education: Stress, Anxiety, and Depression - Group verbal and visual presentation to define topics covered.  Reviews how body is impacted by stress, anxiety, and depression.  Also discusses healthy ways to reduce stress and to treat/manage anxiety and depression.  Written material given at graduation. Flowsheet Row Cardiac Rehab from 05/09/2021 in [facility] Cardiac [facility] Pul[facility] Rehab  Date 03/21/21  Educator [facility]  Instruction Review Code 1- Verbalizes Understanding       Education: Sleep Hygiene -Provides group verbal and written instruction about how sleep can affect your health.  Define sleep hygiene, discuss sleep cycles and impact of sleep habits. Review good sleep hygiene tips.    Initial Review & Psychosocial Screening:  Initial Psych Review & Screening - 02/16/21 1010       Initial Review   Current issues with Current Psychotropic Meds;Current Stress Concerns    Source of Stress Concerns Financial    Comments Retired      [place]? Yes   Wife , son and his wife  very close     Barriers   Psychosocial barriers to participate in program There are no identifiable barriers or psychosocial needs.;The patient should benefit from training in stress management and relaxation.      Screening Interventions   Interventions Encouraged to exercise;To provide support and resources with identified psychosocial needs;Provide feedback about the scores to participant    Expected Outcomes Short Term goal: Utilizing psychosocial counselor, staff and physician to assist with identification of specific Stressors or current issues interfering with healing process. Setting desired goal for each stressor or current issue identified.;Long Term Goal: Stressors or current issues are controlled or eliminated.;Short Term goal: Identification and review with participant of any Quality of Life or Depression concerns found by scoring the questionnaire.;Long Term goal: The participant improves quality of Life and PHQ9 Scores as seen by post scores and/or verbalization of changes             Quality of Life Scores:   Quality of Life - 03/07/21 1339       Quality of Life   Select Quality of Life      Quality of Life Scores   Health/Function Pre 14.07 %    Socioeconomic Pre 13.56 %    Psych/Spiritual Pre 12.43 %    Family Pre 18.7 %    GLOBAL Pre 14.29 %            Scores of 19 and below usually indicate a poorer quality of life in these areas.  A difference of  2-3 points is a clinically meaningful difference.  A difference of 2-3 points in the total score of the Quality of Life Index has been associated with significant improvement in overall quality of life, self-image, physical symptoms, and general health in studies assessing change in quality of life.  PHQ-9: Recent Review Flowsheet Data     Depression screen [facility] 2/9 05/07/2021 04/04/2021 03/07/2021 03/14/2014 12/15/2013   Decreased Interest 0 1 1 0 0   Down, Depressed,  Hopeless 0 _0 0   PHQ - 2 Score 0 2 2 1  0   Altered sleeping 1 0 2 - -   Tired, decreased energy _0 - -   Change in appetite 0 0 0 - -   Feeling bad or failure about yourself  _1 - -   Trouble concentrating 0 0 0 - -   Moving slowly or fidgety/restless 0 0 0 - -   Suicidal thoughts 0 0 0 - -   PHQ-9 Score _2 - -   Difficult doing work/chores Not difficult at all Not difficult at all Not difficult at all - -      Interpretation of Total Score  Total Score Depression Severity:  1-4 = Minimal depression, 5-9 = Mild depression, 10-14 = Moderate depression, 15-19 = Moderately severe depression, 20-27 = Severe depression   Psychosocial Evaluation and Intervention:  Psychosocial Evaluation - 02/16/21 1028       Psychosocial Evaluation & Interventions   Interventions Encouraged to exercise with the program and follow exercise prescription    Comments Veer has no barriers to attending the program. He lives with his wife of 72 years. Their son and daughter in law live close by and they talk with or see them several times a week. He does work some and enjoys working as it keeps him moving. He has completed Cardiac Rehab in the past and is looking forward to doing as much as he can to provide him guidance with his exercise. He has made several nutrition changes and is ready to review this with the RD. He does have some stress,financial,and some mild depression. He is on medication prescribed by his physician.  He does have some back problems and will let staff know if the exercise regimen increases his pain.  He shoudl do well with the program.    Expected Outcomes STG: Attending all scheduled sessions. Keeping stress and depression controlled. Learning and managing exercise/nutriton goals  LTG: [place] will be able to continue progress he gained in the program    Continue Psychosocial Services  Follow up required by staff             Psychosocial Re-Evaluation:  Psychosocial  Re-Evaluation     [place] Name 03/14/21 0725 04/11/21 0734 05/07/21 0725 05/16/21 0739       Psychosocial Re-Evaluation   Current issues with Current Stress Concerns;Current Sleep Concerns Current Stress Concerns;Current Sleep Concerns Current Stress Concerns Current Stress Concerns    Comments Todrick says he has high stress.  He exercises to help and works part time.  He also reads.  He has taken courses on mindfulness and uses meditation to lower BP.  He wears a CPAP and takes a medication to help with sleep. Gryphon is doing well mentally.  He is good in class and works hard.  He works three nights a week lifting, walking, and moving things.  His wife kept him up last night as she was having nightmares.  Normally, he sleeps well and is compliant with his CPAP.  We updated his PHQ last week and his score is improving as he is getting to feel better.  He was able to facetime with his grandkids over weekend and really enjoys that connection. Reviewed patient health questionnaire (PHQ-9) with patient for follow up. Previously, patients score indicated signs/symptoms of depression.  Reviewed to see if patient is improving symptom wise while in program.  Score improved and patient states that it is because he has been able to exercise more and be social. He  is working his part time job and enjoys what he does. He is going to get a sleep study done, but otherwise thinks it is fine. He does have some tiredness during the day, but he takes naps. He reports his CPAP at night still, but he can't get the report and he needs a new one.  He reports having some financial stress and he relies on his wife for support. He enjoys watching movies and reading news.    Expected Outcomes Short:  continue to use all tools he has to help manage stress Long: manage stress long term Short: Continue to cope with sleep habits.  Long: Conitnue to focus on the good things. Short: Continue to attend [facility] regularly for regular  exercise and social engagement. Long: Continue to improve symptoms and manage a positive mental state. Short: Continue to attend [facility] regularly for regular exercise and social engagement. Long: Continue to improve symptoms and manage a positive mental state.    Interventions Stress management education;Relaxation education;Encouraged to attend Cardiac Rehabilitation for the exercise Stress management education;Relaxation education;Encouraged to attend Cardiac Rehabilitation for the exercise Encouraged to attend Cardiac Rehabilitation for the exercise Encouraged to attend Cardiac Rehabilitation for the exercise    Continue Psychosocial Services  -- Follow up required by staff No Follow up required No Follow up required      Initial Review   Source of Stress Concerns -- -- -- Financial    Comments -- -- -- Retired             Psychosocial Discharge (Final Psychosocial Re-Evaluation):  Psychosocial Re-Evaluation - 05/16/21 0739       Psychosocial Re-Evaluation   Current issues with Current Stress Concerns    Comments He is going to get a sleep study done, but otherwise thinks it is fine. He does have some tiredness during the day, but he takes naps. He reports his CPAP at night still, but he can't get the report and he needs a new one.  He reports having some financial stress and he relies on his wife for support. He enjoys watching movies and reading news.    Expected Outcomes Short: Continue to attend [facility] regularly for regular exercise and social engagement. Long: Continue to improve symptoms and manage a positive mental state.    Interventions Encouraged to attend Cardiac Rehabilitation for the exercise    Continue Psychosocial Services  No Follow up required      Initial Review   Source of Stress Concerns Financial    Comments Retired             Vocational Rehabilitation: Provide vocational rehab assistance to qualifying candidates.    Vocational Rehab Evaluation & Intervention:  Vocational Rehab - 02/16/21 1016       Initial Vocational Rehab Evaluation & Intervention   Assessment shows need for Vocational Rehabilitation No      Discharge Vocational Rehab   Discharge Vocational Rehabilitation retired             Education: Education Goals: Education classes will be provided on a variety of topics geared toward better understanding of heart health and risk factor modification. Participant will state understanding/return demonstration of topics presented as noted by education test scores.  Learning Barriers/Preferences:   General Cardiac Education Topics:  AED/CPR: - Group verbal and written instruction with the use of models to demonstrate the basic use of the AED with the basic ABC's of resuscitation.   Anatomy and Cardiac Procedures: - Group verbal and  visual presentation and models provide information about basic cardiac anatomy and function. Reviews the testing methods done to diagnose heart disease and the outcomes of the test results. Describes the treatment choices: Medical Management, Angioplasty, or Coronary Bypass Surgery for treating various heart conditions including Myocardial Infarction, Angina, Valve Disease, and Cardiac Arrhythmias.  Written material given at graduation.   Medication Safety: - Group verbal and visual instruction to review commonly prescribed medications for heart and lung disease. Reviews the medication, class of the drug, and side effects. Includes the steps to properly store meds and maintain the prescription regimen.  Written material given at graduation. Flowsheet Row Cardiac Rehab from 05/09/2021 in [facility] Cardiac and Pulmonary Rehab  Date 04/25/21  Educator SB  Instruction Review Code 1- Verbalizes Understanding       Intimacy: - Group verbal instruction through game format to discuss how heart and lung disease can affect sexual intimacy. Written material given at  graduation..   Know Your Numbers and Heart Failure: - Group verbal and visual instruction to discuss disease risk factors for cardiac and pulmonary disease and treatment options.  Reviews associated critical values for Overweight/Obesity, Hypertension, Cholesterol, and Diabetes.  Discusses basics of heart failure: signs/symptoms and treatments.  Introduces Heart Failure Zone chart for action plan for heart failure.  Written material given at graduation. Flowsheet Row Cardiac Rehab from 05/09/2021 in [facility] Cardiac and Pulmonary Rehab  Date 05/09/21  Educator SB  Instruction Review Code 1- Verbalizes Understanding       Infection Prevention: - Provides verbal and written material to individual with discussion of infection control including proper hand washing and proper equipment cleaning during exercise session. Flowsheet Row Cardiac Rehab from 05/09/2021 in [facility] Cardiac and Pulmonary Rehab  Date 03/07/21  Educator [facility]  Instruction Review Code 1- Verbalizes Understanding       Falls Prevention: - Provides verbal and written material to individual with discussion of falls prevention and safety. Flowsheet Row Cardiac Rehab from 05/09/2021 in [facility] Cardiac and Pulmonary Rehab  Date 02/16/21  Educator SB  Instruction Review Code 1- Verbalizes Understanding       Other: -Provides group and verbal instruction on various topics (see comments)   Knowledge Questionnaire Score:  Knowledge Questionnaire Score - 03/07/21 1340       Knowledge Questionnaire Score   Pre Score 25/26             Core Components/Risk Factors/Patient Goals at Admission:  Personal Goals and Risk Factors at Admission - 03/07/21 1340       Core Components/Risk Factors/Patient Goals on Admission    Weight Management Yes;Obesity;Weight Loss    Intervention Weight Management: Develop a combined nutrition and exercise program designed to reach desired caloric intake, while maintaining appropriate intake of  nutrient and fiber, sodium and fats, and appropriate energy expenditure required for the weight goal.;Obesity: Provide education and appropriate resources to help participant work on and attain dietary goals.;Weight Management: Provide education and appropriate resources to help participant work on and attain dietary goals.;Weight Management/Obesity: Establish reasonable short term and long term weight goals.    Admit Weight 194 lb 9.6 oz (88.3 kg)    Goal Weight: Short Term 190 lb (86.2 kg)    Goal Weight: Long Term 180 lb (81.6 kg)    Expected Outcomes Short Term: Continue to assess and modify interventions until short term weight is achieved;Long Term: Adherence to nutrition and physical activity/exercise program aimed toward attainment of established weight goal;Weight Loss: Understanding of general recommendations  for a balanced deficit meal plan, which promotes 1-2 lb weight loss per week and includes a negative energy balance of [id number] kcal/d;Understanding recommendations for meals to include 15-35% energy as protein, 25-35% energy from fat, 35-60% energy from carbohydrates, less than 255m of dietary cholesterol, 20-35 gm of total fiber daily;Understanding of distribution of calorie intake throughout the day with the consumption of 4-5 meals/snacks    Hypertension Yes    Intervention Provide education on lifestyle modifcations including regular physical activity/exercise, weight management, moderate sodium restriction and increased consumption of fresh fruit, vegetables, and low fat dairy, alcohol moderation, and smoking cessation.;Monitor prescription use compliance.    Expected Outcomes Short Term: Continued assessment and intervention until BP is < 140/933mHG in hypertensive participants. < 130/8030mG in hypertensive participants with diabetes, heart failure or chronic kidney disease.;Long Term: Maintenance of blood pressure at goal levels.    Lipids Yes    Intervention Provide education and  support for participant on nutrition & aerobic/resistive exercise along with prescribed medications to achieve LDL <49m28mDL >40mg10m Expected Outcomes Short Term: Participant states understanding of desired cholesterol values and is compliant with medications prescribed. Participant is following exercise prescription and nutrition guidelines.;Long Term: Cholesterol controlled with medications as prescribed, with individualized exercise RX and with personalized nutrition plan. Value goals: LDL < 49mg,32m > 40 mg.             Education:Diabetes - Individual verbal and written instruction to review signs/symptoms of diabetes, desired ranges of glucose level fasting, after meals and with exercise. Acknowledge that pre and post exercise glucose checks will be done for 3 sessions at entry of program.   Core Components/Risk Factors/Patient Goals Review:   Goals and Risk Factor Review     Row Name 03/14/21 0723 04/11/21 0739 05/16/21 0735         Core Components/Risk Factors/Patient Goals Review   Personal Goals Review Hypertension Weight Management/Obesity;Hypertension;Lipids Weight Management/Obesity;Hypertension;Lipids     Review HarveyRaymiets taking all meds as directed.  He has started checking BP at home.  ususaly it runs high 140s - 80s.  He is having a kidney blood flow test.  BP 126/64 resting today. HarveyEscoeen doing well in rehab.  His weight is staying steady.  He would like it to go down more, but his back continues to limit him.  His blood pressures are doing well now.  He is feeling better. They did a sonogram of his kidneys and blood flow was good.  He is getting better readings in class so that seems to have helped.  We encouraged him to bring in his cuff to compare. He has talked to doctor about it and they mentioned that home cuffs do tend to read higher in general. HarveyObinues to do well in rehab, his weight continues to stay steady. He has recently had constipation  due to increased hydrocodone (he has been taking these for a long time); he reports he is due for another catscan. His blood pressure continues to do well. This morning his BP was 128/64 even though he took medicine last night - he does not take his BP at home anymore since coming to rehab, still encouraged him to take it when not at rehab. He is taking medications as prescribed with no issues.     Expected Outcomes Short; follow up with tests  Long: continue to monitor BP Short: Continue to keep close eye on blood pressure Long: Continue to monitor  risk factors Short: Continue to keep close eye on blood pressure Long: Continue to monitor risk factors              Core Components/Risk Factors/Patient Goals at Discharge (Final Review):   Goals and Risk Factor Review - 05/16/21 0735       Core Components/Risk Factors/Patient Goals Review   Personal Goals Review Weight Management/Obesity;Hypertension;Lipids    Review Suyash continues to do well in rehab, his weight continues to stay steady. He has recently had constipation due to increased hydrocodone (he has been taking these for a long time); he reports he is due for another catscan. His blood pressure continues to do well. This morning his BP was 128/64 even though he took medicine last night - he does not take his BP at home anymore since coming to rehab, still encouraged him to take it when not at rehab. He is taking medications as prescribed with no issues.    Expected Outcomes Short: Continue to keep close eye on blood pressure Long: Continue to monitor risk factors             ITP Comments:  ITP Comments     Row Name 02/16/21 1023 03/07/21 1315 03/09/21 0808 03/20/21 0804 04/04/21 0707   ITP Comments Virtual orientation call completed today. he has an appointment on Date: 03/05/2021  for EP eval and gym Orientation.  Documentation of diagnosis can be found in [facility] Date: 02/12/21 . Completed 6MWT and gym orientation. Initial ITP created  and sent for review to Dr. Emily Filbert, Medical Director. First full day of exercise!  Patient was oriented to gym and equipment including functions, settings, policies, and procedures.  Patient's individual exercise prescription and treatment plan were reviewed.  All starting workloads were established based on the results of the 6 minute walk test done at initial orientation visit.  The plan for exercise progression was also introduced and progression will be customized based on patient's performance and goals. Completed initial RD consultation 30 Day review completed. Medical Director ITP review done, changes made as directed, and signed approval by Medical Director.    [place] Name 05/02/21 [phone] 05/30/21 0822 06/08/21 0736       ITP Comments 30 Day review completed. Medical Director ITP review done, changes made as directed, and signed approval by Medical Director. 30 Day review completed. Medical Director ITP review done, changes made as directed, and signed approval by Medical Director. Thao graduated today from  rehab with 36 sessions completed.  Details of the patient's exercise prescription and what He needs to do in order to continue the prescription and progress were discussed with patient.  Patient was given a copy of prescription and goals.  Patient verbalized understanding.  Frandy plans to continue to exercise by using the gym where he lives.              Comments: Discharge ITP

## 2021-06-08 NOTE — Progress Notes (Signed)
Discharge Progress Report ? ?Patient Details  ?Name: Terry Cook ?MRN: [id number] ?Date of Birth: 1950-04-19 ?Referring Provider:   ?Flowsheet Row Cardiac Rehab from 03/07/2021 in [facility]  ?Referring Provider Lorine Bears MD  ? ?  ? ? ? ?Number of Visits: 36 ? ?Reason for Discharge:  ?Patient reached a stable level of exercise. ?Patient independent in their exercise. ? ?Diagnosis:  ?Status post coronary artery stent placement ? ? ?Initial Exercise Prescription: ? Initial Exercise Prescription - 03/07/21 1300   ? ?  ? Date of Initial Exercise RX and Referring Provider  ? Date 03/07/21   ? Referring Provider Lorine Bears MD   ?  ? Oxygen  ? Maintain Oxygen Saturation 88% or higher   ?  ? Treadmill  ? MPH 2.3   ? Grade 0.5   ? Minutes 15   ? METs 2.76   ?  ? Recumbant Bike  ? Level 2   ? RPM 50   ? Watts 24   ? Minutes 15   ? METs 2.8   ?  ? REL-XR  ? Level 2   ? Speed 50   ? Minutes 15   ? METs 2.8   ?  ? Track  ? Laps 35   ? Minutes 15   ? METs 2.9   ?  ? Prescription Details  ? Frequency (times per week) 3   ? Duration Progress to 30 minutes of continuous aerobic without signs/symptoms of physical distress   ?  ? Intensity  ? THRR 40-80% of Max Heartrate 99-133   ? Ratings of Perceived Exertion 11-13   ? Perceived Dyspnea 0-4   ?  ? Progression  ? Progression Continue to progress workloads to maintain intensity without signs/symptoms of physical distress.   ?  ? Resistance Training  ? Training Prescription Yes   ? Weight 4  lb   ? Reps 10-15   ? ?  ?  ? ?  ? ? ?Discharge Exercise Prescription (Final Exercise Prescription Changes): ? Exercise Prescription Changes - 06/04/21 1300   ? ?  ? Response to Exercise  ? Blood Pressure (Admit) 124/70   ? Blood Pressure (Exit) 108/66   ? Heart Rate (Admit) 59 bpm   ? Heart Rate (Exercise) 92 bpm   ? Heart Rate (Exit) 74 bpm   ? Oxygen Saturation (Admit) 94 %   ? Oxygen Saturation (Exercise) 93 %   ? Oxygen Saturation (Exit) 96 %   ? Rating of  Perceived Exertion (Exercise) 16   ? Duration Continue with 30 min of aerobic exercise without signs/symptoms of physical distress.   ? Intensity THRR unchanged   ?  ? Progression  ? Progression Continue to progress workloads to maintain intensity without signs/symptoms of physical distress.   ? Average METs 5   ?  ? Resistance Training  ? Training Prescription Yes   ? Weight 6 lb   ? Reps 10-15   ?  ? Interval Training  ? Interval Training No   ?  ? NuStep  ? Level 8   ? Minutes 30   ? METs 5   ?  ? Home Exercise Plan  ? Plans to continue exercise at Home (comment)   walking, bike in complex gym, join gym  ? Frequency Add 2 additional days to program exercise sessions.   ? Initial Home Exercises Provided 03/24/21   ?  ? Oxygen  ? Maintain Oxygen Saturation 88% or  higher   ? ?  ?  ? ?  ? ? ?Functional Capacity: ? 6 Minute Walk   ? ? Row Name 03/07/21 1316 05/21/21 0738  ?  ?  ? 6 Minute Walk  ? Phase Initial Discharge   ? Distance 1360 feet 1435 feet   ? Distance % Change -- 6 %   ? Distance Feet Change -- 75 ft   ? Walk Time 6 minutes 6 minutes   ? # of Rest Breaks 0 0   ? MPH 2.58 2.72   ? METS 2.88 2.69   ? RPE 8 11   ? VO2 Peak 10.07 9.4   ? Symptoms Yes (comment) No   ? Comments tail bone/back pain 1/10, burning in thighs, SOB --   ? Resting HR 65 bpm 60 bpm   ? Resting BP 144/86 128/62   ? Resting Oxygen Saturation  96 % 95 %   ? Exercise Oxygen Saturation  during 6 min walk 96 % 94 %   ? Max Ex. HR 90 bpm 69 bpm   ? Max Ex. BP 158/86 142/70   ? 2 Minute Post BP -- 130/80   ? ?  ?  ? ?  ? ? ?Psychological, QOL, Others - Outcomes: ?PHQ 2/9: ?Depression screen [facility] 2/9 05/07/2021 04/04/2021 03/07/2021 03/14/2014 12/15/2013  ?Decreased Interest 0 1 1 0 0  ?Down, Depressed, Hopeless 0 1 1 1  0  ?PHQ - 2 Score 0 2 2 1  0  ?Altered sleeping 1 0 2 - -  ?Tired, decreased energy 2 1 2  - -  ?Change in appetite 0 0 0 - -  ?Feeling bad or failure about yourself  1 2 1  - -  ?Trouble concentrating 0 0 0 - -  ?Moving slowly or  fidgety/restless 0 0 0 - -  ?Suicidal thoughts 0 0 0 - -  ?PHQ-9 Score 4 5 7  - -  ?Difficult doing work/chores Not difficult at all Not difficult at all Not difficult at all - -  ? ?Nutrition & Weight - Outcomes: ? Pre Biometrics - 03/07/21 1339   ? ?  ? Pre Biometrics  ? Height 5' 6.5" (1.689 m)   ? Weight 194 lb 9.6 oz (88.3 kg)   ? BMI (Calculated) 30.94   ? Single Leg Stand 4.8 seconds   ? ?  ?  ? ?  ? ? Post Biometrics - 05/21/21 0740   ? ?  ?  Post  Biometrics  ? Height 5' 6.5" (1.689 m)   ? Weight 194 lb (88 kg)   ? BMI (Calculated) 30.85   ? Single Leg Stand 12.69 seconds   ? ?  ?  ? ?  ? ? ?Nutrition: ? Nutrition Therapy & Goals - 03/20/21 0728   ? ?  ? Nutrition Therapy  ? Diet Heart Healthy Low Na   ? Drug/Food Interactions Statins/Certain Fruits   ? Protein (specify units) 70g   ? Fiber 30 grams   ? Whole Grain Foods 3 servings   ? Saturated Fats 12 max. grams   ? Fruits and Vegetables 8 servings/day   ? Sodium 1.5 grams   ?  ? Personal Nutrition Goals  ? Nutrition Goal ST: Discussed introducing more vegetables instead of some of the servings of fruit, introduce one new vegetable per week, and pairing some fruit servings with protein and fat to better balance snacks (yogurt with fruit, nuts/seeds or nut/seed butter with fruit) LT: increase variety, reduce overal  fruit servings by at least 2, follow MyPlate guidelines for meals and the fiber, protein, fat combination for snacks.   ? Comments 71 y.o. M admitted to rehab s/p stent placement also presents with HTN, CAD, HLD. PMH includes previous MI. Relevant medications include lipitor, xanax, zoloft, ambien, fibercon, oxycodone. PYP 69. He repots having many heart healthy education sessions in his life as well as reading articles and feels he does not need more nutrition education at this time. He has no "junk" or fried foods, very little red meat, he uses olive oil mixed with lemon juice and some spices. He eats about 4 bananas, 4 oranges, and 3 apples  per day. He eats broccoli and stringbeans - he feels he should get more, 1 baked potato per week. He reports not picking up a salt shaker for 40 years.  He will have yogurt with fresh blueberries during the day to help with hunger. B: scrambled egg (with some olive oil based margarine) 2x/week or fruit L: [place] sandwich with mustard or peanut butter sandwich (whole wheat). D: sometimes bowl of cereal with low fat milk, Last night he had salmon with no butter and spices as well as a salad with olive oil and vinegar. Drinks: water 5-6 glasses. His constipation is alliviated by the fibercon he takes daily. Reviewed general heart healthy eating, balance in meals, and provided nutrition handout for him to review. Discussed introducing more vegetables instead of some of the servings of fruit, increasing variety, and pairing some fruit servings with protein and fat to better balance snacks (yogurt with fruit, nuts/seeds or nut/seed butter with fruit)   ?  ? Intervention Plan  ? Intervention Prescribe, educate and counsel regarding individualized specific dietary modifications aiming towards targeted core components such as weight, hypertension, lipid management, diabetes, heart failure and other comorbidities.   ? Expected Outcomes Short Term Goal: Understand basic principles of dietary content, such as calories, fat, sodium, cholesterol and nutrients.;Short Term Goal: A plan has been developed with personal nutrition goals set during dietitian appointment.;Long Term Goal: Adherence to prescribed nutrition plan.   ? ?  ?  ? ?  ? ? ? ? ?Goals reviewed with patient; copy given to patient. ?

## 2021-06-20 ENCOUNTER — Ambulatory Visit (HOSPITAL_BASED_OUTPATIENT_CLINIC_OR_DEPARTMENT_OTHER): Payer: Medicare Other | Attending: Cardiology | Admitting: Cardiology

## 2021-06-20 ENCOUNTER — Other Ambulatory Visit: Payer: Self-pay

## 2021-06-20 DIAGNOSIS — I493 Ventricular premature depolarization: Secondary | ICD-10-CM | POA: Diagnosis not present

## 2021-06-20 DIAGNOSIS — R0683 Snoring: Secondary | ICD-10-CM | POA: Diagnosis not present

## 2021-06-20 DIAGNOSIS — I10 Essential (primary) hypertension: Secondary | ICD-10-CM | POA: Diagnosis not present

## 2021-06-20 DIAGNOSIS — G4733 Obstructive sleep apnea (adult) (pediatric): Secondary | ICD-10-CM | POA: Diagnosis not present

## 2021-06-25 DIAGNOSIS — Z7901 Long term (current) use of anticoagulants: Secondary | ICD-10-CM | POA: Diagnosis not present

## 2021-06-25 DIAGNOSIS — Z86010 Personal history of colonic polyps: Secondary | ICD-10-CM | POA: Diagnosis not present

## 2021-06-26 NOTE — Procedures (Signed)
? ?  Patient Name: Terry Cook, Terry Cook ?Study Date: 06/20/2021 ?Gender: Male ?D.O.B: [birth year] ?Age (years): 48 ?Referring Provider: Fransico Him MD, ABSM ?Height (inches): 66 ?Interpreting Physician: Fransico Him MD, ABSM ?Weight (lbs): 190 ?RPSGT: Jorge Ny ?BMI: 31 ?MRN: [id number] ?Neck Size: 17.50 ? ?CLINICAL INFORMATION ?Sleep Study Type: NPSG ? ?Indication for sleep study: Hypertension, Re-Evaluation, Snoring ? ?Epworth Sleepiness Score: 13 ? ?SLEEP STUDY TECHNIQUE ?As per the AASM Manual for the Scoring of Sleep and Associated Events v2.3 (April 2016) with a hypopnea requiring 4% desaturations. ? ?The channels recorded and monitored were frontal, central and occipital EEG, electrooculogram (EOG), submentalis EMG (chin), nasal and oral airflow, thoracic and abdominal wall motion, anterior tibialis EMG, snore microphone, electrocardiogram, and pulse oximetry. ? ?MEDICATIONS ?Medications self-administered by patient taken the night of the study : OXYCODONE HCL, ZOLPIDEM TARTRATE ? ?SLEEP ARCHITECTURE ?The study was initiated at 10:27:15 PM and ended at 4:53:06 AM. ? ?Sleep onset time was 45.2 minutes and the sleep efficiency was 79.6%. The total sleep time was 307 minutes. ? ?Stage REM latency was N/A minutes. ? ?The patient spent 6.5% of the night in stage N1 sleep, 93.5% in stage N2 sleep, 0.0% in stage N3 and 0% in REM. ? ?Alpha intrusion was absent. ? ?Supine sleep was 52.77%. ? ?RESPIRATORY PARAMETERS ?The overall apnea/hypopnea index (AHI) was 31.1 per hour. There were 9 total apneas, including 8 obstructive, 0 central and 1 mixed apneas. There were 150 hypopneas and 9 RERAs. ? ?The AHI during Stage REM sleep was N/A per hour. ? ?AHI while supine was 56.7 per hour. ? ?The mean oxygen saturation was 91.3%. The minimum SpO2 during sleep was 83.0%. ? ?moderate snoring was noted during this study. ? ?CARDIAC DATA ?The 2 lead EKG demonstrated sinus rhythm. The mean heart rate was 72.1 beats per minute. Other  EKG findings include: PVCs. ? ?LEG MOVEMENT DATA ?The total PLMS were 0 with a resulting PLMS index of 0.0. Associated arousal with leg movement index was 1.2 . ? ?IMPRESSIONS ?- Severe obstructive sleep apnea occurred during this study (AHI = 31.1/h). ?- Mild oxygen desaturation was noted during this study (Min O2 = 83.0%). ?- The patient snored with moderate snoring volume. ?- EKG findings include PVCs. ?- Clinically significant periodic limb movements did not occur during sleep. No significant associated arousals. ? ?DIAGNOSIS ?- Obstructive Sleep Apnea (G47.33) ?- Nocturnal Hypoxemia (G47.36) ? ?RECOMMENDATIONS ?- Therapeutic CPAP titration to determine optimal pressure required to alleviate sleep disordered breathing. ?- Positional therapy avoiding supine position during sleep. ?- Avoid alcohol, sedatives and other CNS depressants that may worsen sleep apnea and disrupt normal sleep architecture. ?- Sleep hygiene should be reviewed to assess factors that may improve sleep quality. ?- Weight management and regular exercise should be initiated or continued if appropriate. ? ?[Electronically signed] 06/26/2021 09:59 AM ? ?Fransico Him MD, ABSM ?Diplomate, Tax adviser of Sleep Medicine ?

## 2021-06-27 ENCOUNTER — Encounter: Payer: Self-pay | Admitting: Cardiology

## 2021-06-27 DIAGNOSIS — M47816 Spondylosis without myelopathy or radiculopathy, lumbar region: Secondary | ICD-10-CM | POA: Diagnosis not present

## 2021-06-27 DIAGNOSIS — F1120 Opioid dependence, uncomplicated: Secondary | ICD-10-CM | POA: Diagnosis not present

## 2021-06-27 DIAGNOSIS — M545 Low back pain, unspecified: Secondary | ICD-10-CM | POA: Diagnosis not present

## 2021-06-27 DIAGNOSIS — G588 Other specified mononeuropathies: Secondary | ICD-10-CM | POA: Diagnosis not present

## 2021-06-28 DIAGNOSIS — D696 Thrombocytopenia, unspecified: Secondary | ICD-10-CM | POA: Diagnosis not present

## 2021-06-28 DIAGNOSIS — R739 Hyperglycemia, unspecified: Secondary | ICD-10-CM | POA: Diagnosis not present

## 2021-06-28 DIAGNOSIS — E7800 Pure hypercholesterolemia, unspecified: Secondary | ICD-10-CM | POA: Diagnosis not present

## 2021-06-29 ENCOUNTER — Telehealth: Payer: Self-pay | Admitting: Cardiovascular Disease

## 2021-06-29 NOTE — Telephone Encounter (Signed)
   Pre-operative Risk Assessment    Patient Name: Terry Cook  DOB: Dec 05, 1950 MRN: [id number]{ HEARTCARE STAFF-IMPORTANT INSTRUCTIONS 1 Red and Blue Text will auto delete once note is signed or closed. 2 Press F2 to navigate through template.   3 On drop down lists, L click to select >> R click to activate next field 4 Reason for Visit format is IMPORTANT!!  See Directions on No. 2 below. 5 Please review chart to determine if there is already a clearance note open for this procedure!!  DO NOT duplicate if a note already exists!!         Request for Surgical Clearance{ 1. What type of surgery is being performed? Enter name of procedure below and number of teeth if dental extraction.    Procedure:   Colonoscopy  2. When is this surgery scheduled? Press F2 to enter date below and place date in Reason for Visit (see directions below).  Date of Surgery:  Clearance 09/25/21                              For convenience, highlight and copy (CTL+C) the Clearance MM/DD/YY phrase above. Click here to go to Reason for Visit.  Paste (CTL+V) the date.  Engineer, drilling.  Then click button underneath called Add Clearance MM/DD/YY as free text.        3. What is the name of the Surgeon, the Surgeon's Group or Practice, phone and fax number?  Press F2 and list below  Surgeon:  Dr. Marca Ancona Surgeon's Group or Practice Name:  [facility] Phone number:  [phone] Fax number:  [phone]  4. What type of clearance is requested?  Medical or Cardiac Clearance only?  Pharmacy Clearance Only (Request is to hold medication only)?  Or Both?  Press F2 and select the clearance requested.  If both are needed, select both from the drop down list.      Type of Clearance Requested:   - Medical  -PHarmcacy- Plavix  5. What type of anesthesia will be used?  Press F2 and select the anesthesia to be used for the procedure.   Type of Anesthesia:  Not Indicated  6. Are there any other requests or questions  from the surgeon?    Not indicated Additional requests/questions:    Signed, Dalia Heading   06/29/2021, 3:42 PM

## 2021-06-29 NOTE — Telephone Encounter (Signed)
Preoperative team, please contact this patient and set up a phone call appointment for further cardiac evaluation.  Thank you for your help. ? ?Terry Cook. Terry Lupinacci NP-C ? ?  ?06/29/2021, 3:50 PM ?[facility] ?[address] ?Office [phone] Fax [phone] ? ?

## 2021-06-29 NOTE — Telephone Encounter (Signed)
Left message for the pt to call the office to set up a tele pre op appt.  ?

## 2021-06-30 ENCOUNTER — Telehealth: Payer: Self-pay | Admitting: *Deleted

## 2021-06-30 DIAGNOSIS — G4733 Obstructive sleep apnea (adult) (pediatric): Secondary | ICD-10-CM

## 2021-06-30 NOTE — Telephone Encounter (Signed)
-----   Message from Lauralee Evener, [place] sent at 06/27/2021 10:46 AM EDT ----- ? ?----- Message ----- ?From: Sueanne Margarita, MD ?Sent: 06/26/2021  10:00 AM EDT ?To: Cv Div Sleep Studies ? ?Please let patient know that they have sleep apnea.  Recommend therapeutic CPAP titration for treatment of patient's sleep disordered breathing.  If unable to perform an in lab titration then initiate ResMed auto CPAP from 4 to 15cm H2O with heated humidity and mask of choice and overnight pulse ox on CPAP.    ? ?

## 2021-07-02 DIAGNOSIS — Z23 Encounter for immunization: Secondary | ICD-10-CM | POA: Diagnosis not present

## 2021-07-02 DIAGNOSIS — I10 Essential (primary) hypertension: Secondary | ICD-10-CM | POA: Diagnosis not present

## 2021-07-02 DIAGNOSIS — E876 Hypokalemia: Secondary | ICD-10-CM | POA: Diagnosis not present

## 2021-07-02 DIAGNOSIS — E7800 Pure hypercholesterolemia, unspecified: Secondary | ICD-10-CM | POA: Diagnosis not present

## 2021-07-02 DIAGNOSIS — Z0000 Encounter for general adult medical examination without abnormal findings: Secondary | ICD-10-CM | POA: Diagnosis not present

## 2021-07-02 DIAGNOSIS — F419 Anxiety disorder, unspecified: Secondary | ICD-10-CM | POA: Diagnosis not present

## 2021-07-02 DIAGNOSIS — Z1389 Encounter for screening for other disorder: Secondary | ICD-10-CM | POA: Diagnosis not present

## 2021-07-02 DIAGNOSIS — D696 Thrombocytopenia, unspecified: Secondary | ICD-10-CM | POA: Diagnosis not present

## 2021-07-04 ENCOUNTER — Telehealth: Payer: Self-pay | Admitting: *Deleted

## 2021-07-04 NOTE — Telephone Encounter (Signed)
Spoke with patient and scheduled him for a telehealth visit on 09/07/21 at 9:00 AM. ?

## 2021-07-04 NOTE — Telephone Encounter (Signed)
Will route to requesting surgeon's office to make them aware. 

## 2021-07-04 NOTE — Telephone Encounter (Signed)
?  Patient Consent for Virtual Visit  ? ? ?   ? ?Terry Cook has provided verbal consent on 07/04/2021 for a virtual visit (video or telephone). ? ? ?CONSENT FOR VIRTUAL VISIT FOR:  Terry Cook  ?By participating in this virtual visit I agree to the following: ? ?I hereby voluntarily request, consent and authorize [facility] and its employed or contracted physicians, physician assistants, nurse practitioners or other licensed health care professionals (the Practitioner), to provide me with telemedicine health care services (the ?Services") as deemed necessary by the treating Practitioner. I acknowledge and consent to receive the Services by the Practitioner via telemedicine. I understand that the telemedicine visit will involve communicating with the Practitioner through live audiovisual communication technology and the disclosure of certain medical information by electronic transmission. I acknowledge that I have been given the opportunity to request an in-person assessment or other available alternative prior to the telemedicine visit and am voluntarily participating in the telemedicine visit. ? ?I understand that I have the right to withhold or withdraw my consent to the use of telemedicine in the course of my care at any time, without affecting my right to future care or treatment, and that the Practitioner or I may terminate the telemedicine visit at any time. I understand that I have the right to inspect all information obtained and/or recorded in the course of the telemedicine visit and may receive copies of available information for a reasonable fee.  I understand that some of the potential risks of receiving the Services via telemedicine include:  ?Delay or interruption in medical evaluation due to technological equipment failure or disruption; ?Information transmitted may not be sufficient (e.g. poor resolution of images) to allow for appropriate medical decision making by the Practitioner;  and/or  ?In rare instances, security protocols could fail, causing a breach of personal health information. ? ?Furthermore, I acknowledge that it is my responsibility to provide information about my medical history, conditions and care that is complete and accurate to the best of my ability. I acknowledge that Practitioner's advice, recommendations, and/or decision may be based on factors not within their control, such as incomplete or inaccurate data provided by me or distortions of diagnostic images or specimens that may result from electronic transmissions. I understand that the practice of medicine is not an exact science and that Practitioner makes no warranties or guarantees regarding treatment outcomes. I acknowledge that a copy of this consent can be made available to me via my patient portal [facility] MyChart), or I can request a printed copy by calling the office of [facility].   ? ?I understand that my insurance will be billed for this visit.  ? ?I have read or had this consent read to me. ?I understand the contents of this consent, which adequately explains the benefits and risks of the Services being provided via telemedicine.  ?I have been provided ample opportunity to ask questions regarding this consent and the Services and have had my questions answered to my satisfaction. ?I give my informed consent for the services to be provided through the use of telemedicine in my medical care ? ?  ?

## 2021-07-10 NOTE — Telephone Encounter (Addendum)
The patient has been notified of the result.Per DPR Left detailed message on voicemail and informed patient to call back with questions. ?Titration ready for precert ? ?Latrelle Dodrill, CMA 07/10/2021 6:24 PM   ?

## 2021-07-12 NOTE — Telephone Encounter (Signed)
Prior Authorization for TITRATION sent to [facility] via web portal.  ?do not require Pre-Authorization by [employer] ?

## 2021-08-05 ENCOUNTER — Other Ambulatory Visit: Payer: Self-pay | Admitting: Cardiovascular Disease

## 2021-08-06 ENCOUNTER — Ambulatory Visit (HOSPITAL_BASED_OUTPATIENT_CLINIC_OR_DEPARTMENT_OTHER): Payer: Medicare Other | Attending: Cardiology | Admitting: Cardiology

## 2021-08-06 VITALS — Ht 66.0 in | Wt 190.0 lb

## 2021-08-06 DIAGNOSIS — G4733 Obstructive sleep apnea (adult) (pediatric): Secondary | ICD-10-CM | POA: Diagnosis not present

## 2021-08-10 NOTE — Procedures (Signed)
? ?  Patient Name: Terry, Cook ?Study Date: 08/06/2021 ?Gender: Male ?D.O.B: [birth year] ?Age (years): 16 ?Referring Provider: Armanda Magic MD, ABSM ?Height (inches): 66 ?Interpreting Physician: Armanda Magic MD, ABSM ?Weight (lbs): 190 ?RPSGT: Ulyess Mort ?BMI: 31 ?MRN: [id number] ?Neck Size: 17.50 ? ?CLINICAL INFORMATION ?The patient is referred for a BiPAP titration to treat sleep apnea. ? ?SLEEP STUDY TECHNIQUE ?As per the AASM Manual for the Scoring of Sleep and Associated Events v2.3 (April 2016) with a hypopnea requiring 4% desaturations. ? ?The channels recorded and monitored were frontal, central and occipital EEG, electrooculogram (EOG), submentalis EMG (chin), nasal and oral airflow, thoracic and abdominal wall motion, anterior tibialis EMG, snore microphone, electrocardiogram, and pulse oximetry. Bilevel positive airway pressure (BPAP) was initiated at the beginning of the study and titrated to treat sleep-disordered breathing. ? ?MEDICATIONS ?Medications self-administered by patient taken the night of the study : OXYCODONE HCL, ZOLPIDEM TARTRATE ? ?RESPIRATORY PARAMETERS ?Optimal IPAP Pressure (cm): 20  ?AHI at Optimal Pressure (/hr) 0 ?Optimal EPAP Pressure (cm):16  ?Overall Minimal O2 (%):87.0  ?Minimal O2 at Optimal Pressure (%): 92.0 ? ?SLEEP ARCHITECTURE ?Start Time:10:22:36 PM  ?Stop Time:4:51:02 AM  ?Total Time (min):388.4  ?Total Sleep Time (min):348 ?Sleep Latency (min): 22.8  ?Sleep Efficiency (%):89.6%  ?REM Latency (min):87.0  ?WASO (min):17.7 ?Stage N1 (%):15.2%  ?Stage N2 (%): 83.2%  ?Stage N3 (%):0.0%  ?Stage R (%):1.6 ?Supine (%):24.43  ?Arousal Index (/hr):31.6  ? ?CARDIAC DATA ?The 2 lead EKG demonstrated sinus rhythm. The mean heart rate was 49.2 beats per minute. Other EKG findings include: PVCs. ? ?LEG MOVEMENT DATA ?The total Periodic Limb Movements of Sleep (PLMS) were 0. The PLMS index was 0.0. A PLMS index of <15 is considered normal in adults. ? ?IMPRESSIONS ?- An optimal  PAP pressure was selected for this patient ( 20/16cm of water) ?- Central sleep apnea was not noted during this titration (CAI = 2.9/h). ?- Mild oxygen desaturations were observed during this titration (min O2 = 87.0%). ?- The patient snored with moderate snoring volume. ?- 2-lead EKG demonstrated: PVCs ?- Clinically significant periodic limb movements were not noted during this study. Arousals associated with PLMs were rare. ? ?DIAGNOSIS ?- Obstructive Sleep Apnea (G47.33) ? ?RECOMMENDATIONS ?- Trial of BiPAP therapy on 20/16 cm H2O with a Medium size Resmed Full Face Quattro FX mask and heated humidification. ?- Avoid alcohol, sedatives and other CNS depressants that may worsen sleep apnea and disrupt normal sleep architecture. ?- Sleep hygiene should be reviewed to assess factors that may improve sleep quality. ?- Weight management and regular exercise should be initiated or continued. ?- Return to Sleep Center for re-evaluation after 6 weeks of therapy ? ?[Electronically signed] 08/10/2021 03:21 PM ? ?Armanda Magic MD, ABSM ?Diplomate, Biomedical engineer of Sleep Medicine ?

## 2021-08-16 ENCOUNTER — Telehealth: Payer: Self-pay | Admitting: *Deleted

## 2021-08-16 NOTE — Telephone Encounter (Signed)
-----   Message from Gaynelle Cage, [place] sent at 08/10/2021  4:06 PM EDT ----- ? ?----- Message ----- ?From: Quintella Reichert, MD ?Sent: 08/10/2021   3:23 PM EDT ?To: Cv Div Sleep Studies ? ?Please let patient know that they had a successful PAP titration and let DME know that orders are in EPIC.  Please set up 6 week OV with me.  ?  ? ?

## 2021-08-16 NOTE — Telephone Encounter (Signed)
The patient has been notified of the result. Left detailed message on voicemail and informed patient to call back with questions.Adelisa Satterwhite Green, CMA 08/16/2021 12:08 PM   ? ?Upon patient request DME selection is [facility] ?Patient understands he will be contacted by [facility] to set up his cpap. ?Patient understands to call if [facility] does not contact him with new setup in a timely manner. ?Patient understands they will be called once confirmation has been received from [facility]/ that they have received their new machine to schedule 10 week follow up appointment. ?  ?[facility] notified of new cpap order  ?Please add to airview ?Patient was grateful for the call and thanked me.   ? ? ?

## 2021-08-21 NOTE — Telephone Encounter (Signed)
Returned call: ?Patient understanding was verbalized.  ?

## 2021-09-05 DIAGNOSIS — M47816 Spondylosis without myelopathy or radiculopathy, lumbar region: Secondary | ICD-10-CM | POA: Diagnosis not present

## 2021-09-05 DIAGNOSIS — F1120 Opioid dependence, uncomplicated: Secondary | ICD-10-CM | POA: Diagnosis not present

## 2021-09-07 ENCOUNTER — Ambulatory Visit: Payer: Medicare Other | Admitting: Nurse Practitioner

## 2021-09-07 ENCOUNTER — Telehealth: Payer: Self-pay | Admitting: Nurse Practitioner

## 2021-09-07 DIAGNOSIS — Z01810 Encounter for preprocedural cardiovascular examination: Secondary | ICD-10-CM

## 2021-09-07 NOTE — Telephone Encounter (Signed)
Mr. Terry Cook was contacted this morning for his scheduled preoperative telephone visit.  Patient was contacted twice by APP with no answer and detailed voicemail left on machine.  Patient was also contacted by Danielle Rankin, CMA with no answer and she left detailed message to have patient call back.  Robin Searing, NP

## 2021-09-07 NOTE — Progress Notes (Unsigned)
Virtual Visit via Telephone Note   Because of Terry Cook's co-morbid illnesses, he is at least at moderate risk for complications without adequate follow up.  This format is felt to be most appropriate for this patient at this time.  The patient did not have access to video technology/had technical difficulties with video requiring transitioning to audio format only (telephone).  All issues noted in this document were discussed and addressed.  No physical exam could be performed with this format.  Please refer to the patient's chart for his consent to telehealth for [facility].  Evaluation Performed:  Preoperative cardiovascular risk assessment _____________   Date:  09/07/2021   Patient ID:  [name], DOB 1951/01/27, MRN [id number] Patient Location:  Home Provider location:   Office  Primary Care Provider:  Ileana Ladd, MD Primary Cardiologist:  Lorine Bears, MD  Chief Complaint / Patient Profile   71 y.o. y/o male with a h/o CAD with prior MI in 2013 status post PCI to the LCx with unstable angina in 2015 s/p PCI to the LAD, ICM, HTN, HLD, anxiety, BPH, OSA on CPAP, COVID x2 who is pending colonoscopy and presents today for telephonic preoperative cardiovascular risk assessment.  Past Medical History    Past Medical History:  Diagnosis Date   Anxiety    CAD (coronary artery disease), native coronary artery 06/04/2011   s/p MI with PCI to left circ   Chronic lower back pain    DDD (degenerative disc disease), lumbar    Depression    High cholesterol    History of kidney stones    Hypertension    Irregular heart beats    MI (myocardial infarction) (HCC) 05/2011   OSA on CPAP    Past Surgical History:  Procedure Laterality Date   COLONOSCOPY     CORONARY ANGIOPLASTY WITH STENT PLACEMENT  05/2011   "1"   CORONARY PRESSURE WIRE/FFR WITH 3D MAPPING N/A 02/12/2021   Procedure: Coronary Pressure Wire/FFR w/3D Mapping;  Surgeon: Iran Ouch, MD;   Location: [facility] INVASIVE CV LAB;  Service: Cardiovascular;  Laterality: N/A;   CORONARY STENT INTERVENTION N/A 02/12/2021   Procedure: CORONARY STENT INTERVENTION;  Surgeon: Iran Ouch, MD;  Location: [facility] INVASIVE CV LAB;  Service: Cardiovascular;  Laterality: N/A;   CYST EXCISION Right ~ 2009   "index finger; in office"   CYSTOSCOPY W/ STONE MANIPULATION  ~ 1975 X 2   LEFT HEART CATH AND CORONARY ANGIOGRAPHY Left 02/12/2021   Procedure: LEFT HEART CATH AND CORONARY ANGIOGRAPHY;  Surgeon: Iran Ouch, MD;  Location: [facility] INVASIVE CV LAB;  Service: Cardiovascular;  Laterality: Left;   LEFT HEART CATHETERIZATION WITH CORONARY ANGIOGRAM N/A 06/04/2011   Procedure: LEFT HEART CATHETERIZATION WITH CORONARY ANGIOGRAM;  Surgeon: Othella Boyer, MD;  [facility] CATH LAB;  Service: Cardiovascular;  Laterality: N/A;   LEFT HEART CATHETERIZATION WITH CORONARY ANGIOGRAM N/A 11/18/2013   Procedure: LEFT HEART CATHETERIZATION WITH CORONARY ANGIOGRAM;  Surgeon: Lesleigh Noe, MD;  Location: [facility] CATH LAB;  Service: Cardiovascular;  Laterality: N/A;   PERCUTANEOUS CORONARY STENT INTERVENTION (PCI-S)  11/18/2013   Procedure: PERCUTANEOUS CORONARY STENT INTERVENTION (PCI-S);  Surgeon: Lesleigh Noe, MD;  Location: [facility] CATH LAB;  Service: Cardiovascular;;   SHOULDER ARTHROSCOPY WITH OPEN ROTATOR CUFF REPAIR Right 11/24/2018   Procedure: SHOULDER ARTHROSCOPY WITH OPEN MINI ROTATOR CUFF REPAIR, SUBACROMIAL DECOMPRESSION, DISTAL CLAVICLE EXCISION, BICEPS TENODESIS;  Surgeon: Juanell Fairly, MD;  Location: [facility] ORS;  Service:  Orthopedics;  Laterality: Right;    Allergies  No Known Allergies  History of Present Illness    Terry Cook is a 71 y.o. male who presents via audio/video conferencing for a telehealth visit today.  Pt was last seen in cardiology clinic on 04/24/2021 by Eula Listen, PA.  At that time [name] was doing well from a cardiac perspective.  He did note some pedal/ankle  edema that occurs when on his feet for extended time.  The patient is now pending procedure as outlined above. Since his last visit, he ***   Home Medications    Prior to Admission medications   Medication Sig Start Date End Date Taking? Authorizing Provider  acetaminophen (TYLENOL) 500 MG tablet Take 1,000 mg by mouth every 8 (eight) hours as needed for mild pain or moderate pain.    [provider]  ALPRAZolam Prudy Feeler) 0.5 MG tablet Take 0.5 mg by mouth 2 (two) times daily as needed for anxiety.    [provider]  amLODipine (NORVASC) 10 MG tablet TAKE ONE TABLET BY MOUTH ONE TIME DAILY 08/06/21   Iran Ouch, MD  aspirin EC 81 MG tablet Take 81 mg by mouth daily.    [provider]  atorvastatin (LIPITOR) 80 MG tablet Take 1 tablet (80 mg total) by mouth every evening. 12/18/20   Iran Ouch, MD  clopidogrel (PLAVIX) 75 MG tablet Take 1 tablet (75 mg total) by mouth daily. 05/08/21   Iran Ouch, MD  diclofenac sodium (VOLTAREN) 1 % GEL Apply 1 application topically 3 (three) times daily. Apply to feet    [provider]  ezetimibe (ZETIA) 10 MG tablet Take 1 tablet (10 mg total) by mouth daily. 03/12/21 06/10/21  Sondra Barges, PA-C  hydrOXYzine (VISTARIL) 25 MG capsule Take 50 mg by mouth every 8 (eight) hours as needed. 02/28/21   [provider]  losartan-hydrochlorothiazide (HYZAAR) 100-25 MG tablet Take 1 tablet by mouth daily.    [provider]  naloxone [facility]) nasal spray 4 mg/0.1 mL SMARTSIG:Both Nares Once a Week 04/14/21   [provider]  Oxycodone HCl 10 MG TABS Take 10 mg by mouth 3 (three) times daily. 02/07/21   [provider]  polycarbophil (FIBERCON) 625 MG tablet Take 1,250 mg by mouth daily.    [provider]  sertraline (ZOLOFT) 25 MG tablet Take 75 mg by mouth daily. 03/28/20   [provider]  silodosin (RAPAFLO) 8 MG CAPS capsule Take 1 capsule (8 mg total) by mouth  daily with breakfast. 01/17/21   Sondra Come, MD  zolpidem (AMBIEN) 10 MG tablet Take 10 mg by mouth at bedtime.    [provider]    Physical Exam    Vital Signs:  ANDRIUS ANDREPONT does not have vital signs available for review today.***  Given telephonic nature of communication, physical exam is limited. AAOx3. NAD. Normal affect.  Speech and respirations are unlabored.  Accessory Clinical Findings    None  Assessment & Plan    1.  Preoperative Cardiovascular Risk Assessment:  { Click Here to Calculate RCRI      :628366294}  { Click Here to Calculate DASI      :765465035} {Select to add RCRI Risk (<1%=LOW; >/=1%=HIGH) (Optional):21036017}  {Select if HIGH (RCRI >/=1%) Risk (Optional):21036030} Recommendations: {2014 ACC/AHA Perioperative Guidelines  :21036001} Antiplatelet and/or Anticoagulation Recommendations: {Antiplatelet Recommendations                  :  16109604}21036016} {Anticoagulation Recommendations           :54098119}21036019}    (Reminder: Include SBE prophylaxis/Antiplatelet/Anticoag Instructions)  None requested   A copy of this note will be routed to requesting surgeon.  Time:   Today, I have spent *** minutes with the patient with telehealth technology discussing medical history, symptoms, and management plan.     Napoleon Formick Jr, Leodis RainsErnest Henry, NP  09/07/2021, 6:43 AM

## 2021-09-08 NOTE — Progress Notes (Signed)
No Show phone visit

## 2021-09-10 NOTE — Telephone Encounter (Signed)
I s/w the pt and we have rescheduled tele pre op appt 09/12/21 1:20 pm per pt request. Pt thanked me for the help.

## 2021-09-10 NOTE — Telephone Encounter (Signed)
Pt returning call and req call back.  

## 2021-09-12 ENCOUNTER — Encounter: Payer: Self-pay | Admitting: Nurse Practitioner

## 2021-09-12 ENCOUNTER — Ambulatory Visit (INDEPENDENT_AMBULATORY_CARE_PROVIDER_SITE_OTHER): Payer: Medicare Other | Admitting: Nurse Practitioner

## 2021-09-12 DIAGNOSIS — Z01810 Encounter for preprocedural cardiovascular examination: Secondary | ICD-10-CM

## 2021-09-12 NOTE — Progress Notes (Signed)
Virtual Visit via Telephone Note   Because of Terry Cook's co-morbid illnesses, he is at least at moderate risk for complications without adequate follow up.  This format is felt to be most appropriate for this patient at this time.  The patient did not have access to video technology/had technical difficulties with video requiring transitioning to audio format only (telephone).  All issues noted in this document were discussed and addressed.  No physical exam could be performed with this format.  Please refer to the patient's chart for his consent to telehealth [facility].  Evaluation Performed:  Preoperative cardiovascular risk assessment _____________   Date:  09/12/2021   Patient ID:  Terry Cook, DOB January 30, 1951, MRN [id number] Patient Location:  Home Provider location:   Office  Primary Care Provider:  Ileana LaddWong, Francis P, MD Primary Cardiologist:  Lorine BearsMuhammad Arida, MD  Chief Complaint / Patient Profile   71 y.o. y/o male with a h/o CAD s/p PCI/DES-LCx and LAD (most recently in 02/2021), ICM, PVCs, hypertension, hyperlipidemia, OSA on CPAP, BPH, DDD, anxiety who is pending colonoscopy with Dr. Marca [facility]con[facility]Karki [facility] on 09/25/2021 and presents today for telephonic preoperative cardiovascular risk assessment.  Past Medical History    Past Medical History:  Diagnosis Date   Anxiety    CAD (coronary artery disease), native coronary artery 06/04/2011   s/p MI with PCI to left circ   Chronic lower back pain    DDD (degenerative disc disease), lumbar    Depression    High cholesterol    History of kidney stones    Hypertension    Irregular heart beats    MI (myocardial infarction) (HCC) 05/2011   OSA on CPAP    Past Surgical History:  Procedure Laterality Date   COLONOSCOPY     CORONARY ANGIOPLASTY WITH STENT PLACEMENT  05/2011   "1"   CORONARY PRESSURE WIRE/FFR WITH 3D MAPPING N/A 02/12/2021   Procedure: Coronary Pressure Wire/FFR w/3D Mapping;  Surgeon:  Iran OuchArida, Muhammad A, MD;  Location: [facility] INVASIVE CV LAB;  Service: Cardiovascular;  Laterality: N/A;   CORONARY STENT INTERVENTION N/A 02/12/2021   Procedure: CORONARY STENT INTERVENTION;  Surgeon: Iran OuchArida, Muhammad A, MD;  Location: [facility] INVASIVE CV LAB;  Service: Cardiovascular;  Laterality: N/A;   CYST EXCISION Right ~ 2009   "index finger; in office"   CYSTOSCOPY W/ STONE MANIPULATION  ~ 1975 X 2   LEFT HEART CATH AND CORONARY ANGIOGRAPHY Left 02/12/2021   Procedure: LEFT HEART CATH AND CORONARY ANGIOGRAPHY;  Surgeon: Iran OuchArida, Muhammad A, MD;  Location: [facility] INVASIVE CV LAB;  Service: Cardiovascular;  Laterality: Left;   LEFT HEART CATHETERIZATION WITH CORONARY ANGIOGRAM N/A 06/04/2011   Procedure: LEFT HEART CATHETERIZATION WITH CORONARY ANGIOGRAM;  Surgeon: Othella BoyerWilliam S Tilley, MD;  [facility];  Service: Cardiovascular;  Laterality: N/A;   LEFT HEART CATHETERIZATION WITH CORONARY ANGIOGRAM N/A 11/18/2013   Procedure: LEFT HEART CATHETERIZATION WITH CORONARY ANGIOGRAM;  Surgeon: Lesleigh NoeHenry W Smith III, MD;  [facility];  Service: Cardiovascular;  Laterality: N/A;   PERCUTANEOUS CORONARY STENT INTERVENTION (PCI-S)  11/18/2013   Procedure: PERCUTANEOUS CORONARY STENT INTERVENTION (PCI-S);  Surgeon: Lesleigh NoeHenry W Smith III, MD;  [facility];  Service: Cardiovascular;;   SHOULDER ARTHROSCOPY WITH OPEN ROTATOR CUFF REPAIR Right 11/24/2018   Procedure: SHOULDER ARTHROSCOPY WITH OPEN MINI ROTATOR CUFF REPAIR, SUBACROMIAL DECOMPRESSION, DISTAL CLAVICLE EXCISION, BICEPS TENODESIS;  Surgeon: Juanell FairlyKrasinski, Kevin, MD;  Location: [facility] ORS;  Service: Orthopedics;  Laterality: Right;  Allergies  No Known Allergies  History of Present Illness    Terry Cook is a 71 y.o. male who presents via audio/video conferencing for a telehealth visit today.  Pt was last seen in cardiology clinic on 04/24/2021 by Eula Listen, PA. At that time NIRVAAN FRETT was doing well from a cardiac standpoint. He denied  symptoms concerning for angina.The patient is now pending procedure as outlined above. Since his last visit, he has been stable from a cardiac standpoint.  He did note some daytime fatigue, however, this has improved since undergoing CPAP titration. He denies chest pain, palpitations, dyspnea, pnd, orthopnea, n, v, dizziness, syncope, edema, weight gain, or early satiety. All other systems reviewed and are otherwise negative except as noted above. Overall, he reports feeling well and denies any new symptoms or concerns today.  Home Medications    Prior to Admission medications   Medication Sig Start Date End Date Taking? Authorizing Provider  acetaminophen (TYLENOL) 500 MG tablet Take 1,000 mg by mouth every 8 (eight) hours as needed for mild pain or moderate pain.    [provider]  ALPRAZolam Prudy Feeler) 0.5 MG tablet Take 0.5 mg by mouth 2 (two) times daily as needed for anxiety.    [provider]  amLODipine (NORVASC) 10 MG tablet TAKE ONE TABLET BY MOUTH ONE TIME DAILY 08/06/21   Iran Ouch, MD  aspirin EC 81 MG tablet Take 81 mg by mouth daily.    [provider]  atorvastatin (LIPITOR) 80 MG tablet Take 1 tablet (80 mg total) by mouth every evening. 12/18/20   Iran Ouch, MD  clopidogrel (PLAVIX) 75 MG tablet Take 1 tablet (75 mg total) by mouth daily. 05/08/21   Iran Ouch, MD  diclofenac sodium (VOLTAREN) 1 % GEL Apply 1 application topically 3 (three) times daily. Apply to feet    [provider]  ezetimibe (ZETIA) 10 MG tablet Take 1 tablet (10 mg total) by mouth daily. 03/12/21 06/10/21  Sondra Barges, PA-C  hydrOXYzine (VISTARIL) 25 MG capsule Take 50 mg by mouth every 8 (eight) hours as needed. 02/28/21   [provider]  losartan-hydrochlorothiazide (HYZAAR) 100-25 MG tablet Take 1 tablet by mouth daily.    [provider]  naloxone [facility]) nasal spray 4 mg/0.1 mL SMARTSIG:Both Nares Once a Week 04/14/21   [provider]  Oxycodone HCl 10 MG TABS Take 10 mg by mouth 3 (three) times daily. 02/07/21   [provider]  polycarbophil (FIBERCON) 625 MG tablet Take 1,250 mg by mouth daily.    [provider]  sertraline (ZOLOFT) 25 MG tablet Take 75 mg by mouth daily. 03/28/20   [provider]  silodosin (RAPAFLO) 8 MG CAPS capsule Take 1 capsule (8 mg total) by mouth daily with breakfast. 01/17/21   Sondra Come, MD  zolpidem (AMBIEN) 10 MG tablet Take 10 mg by mouth at bedtime.    [provider]    Physical Exam    Vital Signs:  VEGA WITHROW does not have vital signs available for review today.  Given telephonic nature of communication, physical exam is limited. AAOx3. NAD. Normal affect.  Speech and respirations are unlabored.  Accessory Clinical Findings    None  Assessment & Plan    1.  Preoperative Cardiovascular Risk Assessment:  According to the Revised Cardiac Risk Index (RCRI), his Perioperative Risk of Major Cardiac Event is (%): 0.9. His Functional Capacity in METs is: 8.33  according to the [employer] Activity Status Index (DASI). Therefore, based on ACC/AHA guidelines, patient would be at acceptable risk for the planned procedure without further cardiovascular testing. I advised the patient that should he develop any new symptoms prior to his procedure, to please contact our office.  Of note, patient is s/p DES-LCx in November 2022. He is on aspirin and Plavix.  Surgical clearance did not request to hold Plavix prior to procedure. However, patient states he does have a history of bleeding. If patient requires Plavix hold prior to procedure, recommend delaying procedure until after 02/12/2022 at which time patient will have completed 1 year of DAPT post PCI.  A copy of this note will be routed to requesting surgeon.  Time:   Today, I have spent 22 minutes with the patient with telehealth technology discussing medical history, symptoms, and  management plan.     Joylene Grapes, NP  09/12/2021, 1:23 PM

## 2021-10-19 ENCOUNTER — Telehealth: Payer: Self-pay | Admitting: Cardiology

## 2021-10-19 NOTE — Telephone Encounter (Signed)
Patient calling in because he hasnt seen his bpap machine. and wanted know what else can be down. He states that its been 2 months now. Please advise

## 2021-10-22 NOTE — Telephone Encounter (Addendum)
Patients Bipap order is pending scheduling. Patient has been notified.

## 2021-11-04 ENCOUNTER — Other Ambulatory Visit: Payer: Self-pay | Admitting: Cardiovascular Disease

## 2021-11-13 DIAGNOSIS — F1120 Opioid dependence, uncomplicated: Secondary | ICD-10-CM | POA: Diagnosis not present

## 2021-11-13 DIAGNOSIS — M47816 Spondylosis without myelopathy or radiculopathy, lumbar region: Secondary | ICD-10-CM | POA: Diagnosis not present

## 2021-12-03 ENCOUNTER — Ambulatory Visit: Payer: Medicare Other | Admitting: Physician Assistant

## 2021-12-04 ENCOUNTER — Ambulatory Visit: Payer: Medicare Other | Attending: Physician Assistant | Admitting: Physician Assistant

## 2021-12-04 ENCOUNTER — Encounter: Payer: Self-pay | Admitting: Physician Assistant

## 2021-12-04 VITALS — BP 140/78 | HR 63 | Ht 66.0 in | Wt 196.4 lb

## 2021-12-04 DIAGNOSIS — I10 Essential (primary) hypertension: Secondary | ICD-10-CM

## 2021-12-04 DIAGNOSIS — I2510 Atherosclerotic heart disease of native coronary artery without angina pectoris: Secondary | ICD-10-CM

## 2021-12-04 DIAGNOSIS — E785 Hyperlipidemia, unspecified: Secondary | ICD-10-CM

## 2021-12-04 DIAGNOSIS — I255 Ischemic cardiomyopathy: Secondary | ICD-10-CM | POA: Diagnosis not present

## 2021-12-04 DIAGNOSIS — Z9989 Dependence on other enabling machines and devices: Secondary | ICD-10-CM

## 2021-12-04 DIAGNOSIS — G4733 Obstructive sleep apnea (adult) (pediatric): Secondary | ICD-10-CM

## 2021-12-04 DIAGNOSIS — I493 Ventricular premature depolarization: Secondary | ICD-10-CM

## 2021-12-04 NOTE — Patient Instructions (Signed)
Medication Instructions:  NONE *If you need a refill on your cardiac medications before your next appointment, please call your pharmacy*   Lab Work: NONE If you have labs (blood work) drawn today and your tests are completely normal, you will receive your results only by: MyChart Message (if you have MyChart) OR A paper copy in the mail If you have any lab test that is abnormal or we need to change your treatment, we will call you to review the results.   Testing/Procedures: NONE   Follow-Up: At [facility], you and your health needs are our priority.  As part of our continuing mission to provide you with exceptional heart care, we have created designated Provider Care Teams.  These Care Teams include your primary Cardiologist (physician) and Advanced Practice Providers (APPs -  Physician Assistants and Nurse Practitioners) who all work together to provide you with the care you need, when you need it.  We recommend signing up for the patient portal called "MyChart".  Sign up information is provided on this After Visit Summary.  MyChart is used to connect with patients for Virtual Visits (Telemedicine).  Patients are able to view lab/test results, encounter notes, upcoming appointments, etc.  Non-urgent messages can be sent to your provider as well.   To learn more about what you can do with MyChart, go to [email or web address].    Your next appointment:   9 month(s)  The format for your next appointment:   In Person  Provider:   You may see Lorine Bears, MD or one of the following Advanced Practice Providers on your designated Care Team:   Nicolasa Ducking, NP Eula Listen, PA-C Cadence Fransico Michael, PA-C Charlsie Quest, NP     Important Information About Sugar

## 2021-12-04 NOTE — Progress Notes (Signed)
Cardiology Office Note    Date:  12/04/2021   ID:  Terry Cook, DOB 08/18/1950, MRN [id number]  PCP:  Ileana Ladd, MD (Inactive)  Cardiologist:  Lorine Bears, MD  Electrophysiologist:  None   Chief Complaint: Follow-up  History of Present Illness:   Terry Cook is a 71 y.o. male with history of CAD with prior MI in 2013 status post PCI to the LCx with unstable angina in 2015 s/p PCI to the LAD with PCI to the proximal/mid LCx overlapping the previously placed stent in 02/2021, ICM with subsequent improvement in LV systolic function, strong family history of premature coronary artery disease, COVID x2 most recently approximately in 10/2020, PVCs, HTN, HLD, anxiety, BPH, OSA on CPAP, arthritis, and chronic low back pain who presents for follow-up of his CAD.    He was admitted with an MI in 2013.  He underwent PCI to the LCx and was found to have a chronically occluded RCA with left-to-right collaterals at that time.  EF at that time was 40 to 45%.  He presented with unstable angina in 2015 and was found to have a patent LCx stent, though there was 90% stenosis in the mid LAD which was treated successfully with PCI/DES.  He was seen in the office in 04/2020 and was doing reasonably well.  Over the preceding year, he did report 2 brief episodes of chest discomfort.  There were no exertional symptoms with recommendation to continue medical therapy.  Given he had not had an echo since his MI in 2013, he underwent echo in 04/2020 which showed an improvement in his LV systolic function with a low normal EF of 50 to 55%, possible basal inferior wall hypokinesis, mild LVH, grade 1 diastolic dysfunction, normal RV systolic function and ventricular cavity size, moderately dilated left atrium, and no noted significant valvular abnormalities.  He was seen in the office on 01/29/2021 noting intermittent randomly occurring profound fatigue/exhaustion as well as chest pressure. These symptoms felt  similar to his prior angina. Given symptoms, he underwent LHC on 02/12/2021 which demonstrated significant 3-vessel CAD with patent stents in the LAD and LCx. The RCA was chronically occluded with left-to-right collaterals. There was 70% stenosis in the proximal/mid LCx at the proximal edge of the previously placed stent. This was significant by fractional flow reserve evaluation with an iFR ratio of 0.85. There was also moderate disease in the LCx distal to the old stent. This was not significant by fractional flow reserve evaluation after treating the more proximal lesion. The OM1 was small in diameter with severe diffuse disease. Normal LV systolic function and high normal left ventricular end-diastolic pressure. He underwent successful drug-eluting stent placement to the proximal/mid LCx overlapping the previously placed stent.  Post cath, he felt much better.   He was seen in the office on 02/26/2021 and was doing well from a cardiac perspective.  Given persistently elevated BP he underwent renal artery ultrasound which was negative for RAS bilaterally.  We last saw him in the office in 04/2021, at which time he was without symptoms of angina or decompensation.  He was most recently contacted virtually in 09/2021 for preoperative cardiac risk stratification for colonoscopy.   He comes in doing well from a cardiac perspective.  Since he was last seen, approximately 3 months ago, he did have 1 episode of chest discomfort that improved with antacid.  Otherwise, he has been without symptoms of angina or decompensation.  He does remain active, working  out at the gym 2 days/week.  His main limitation at this time continues to be chronic back pain.  He is tolerating cardiac medications without adverse effect.  He remains on DAPT.  No falls or symptoms concerning for bleeding.  Blood pressure at rest is typically in the 1 teens to 120s over 70s.  He feels like he is doing well and does not have any active cardiac  issues or concerns at this time.   Labs independently reviewed: 04/2021 - direct LDL 59, TC 112, TG 147, HDL 30, albumin 4.6, AST/ALT normal 02/2021 - Hgb 15.9, PLT 136, BUN 9, serum creatinine 1.06, potassium 3.9   Past Medical History:  Diagnosis Date   Anxiety    CAD (coronary artery disease), native coronary artery 06/04/2011   s/p MI with PCI to left circ   Chronic lower back pain    DDD (degenerative disc disease), lumbar    Depression    High cholesterol    History of kidney stones    Hypertension    Irregular heart beats    MI (myocardial infarction) ([place]) 05/2011   OSA on CPAP     Past Surgical History:  Procedure Laterality Date   COLONOSCOPY     CORONARY ANGIOPLASTY WITH STENT PLACEMENT  05/2011   "1"   CORONARY PRESSURE WIRE/FFR WITH 3D MAPPING N/A 02/12/2021   Procedure: Coronary Pressure Wire/FFR w/3D Mapping;  Surgeon: Wellington Hampshire, MD;  Location: [facility];  Service: Cardiovascular;  Laterality: N/A;   CORONARY STENT INTERVENTION N/A 02/12/2021   Procedure: CORONARY STENT INTERVENTION;  Surgeon: Wellington Hampshire, MD;  Location: [facility];  Service: Cardiovascular;  Laterality: N/A;   CYST EXCISION Right ~ 2009   "index finger; in office"   CYSTOSCOPY W/ STONE MANIPULATION  ~ 1975 X 2   LEFT HEART CATH AND CORONARY ANGIOGRAPHY Left 02/12/2021   Procedure: LEFT HEART CATH AND CORONARY ANGIOGRAPHY;  Surgeon: Wellington Hampshire, MD;  Location: [facility];  Service: Cardiovascular;  Laterality: Left;   LEFT HEART CATHETERIZATION WITH CORONARY ANGIOGRAM N/A 06/04/2011   Procedure: LEFT HEART CATHETERIZATION WITH CORONARY ANGIOGRAM;  Surgeon: Jacolyn Reedy, MD;  Location: [facility];  Service: Cardiovascular;  Laterality: N/A;   LEFT HEART CATHETERIZATION WITH CORONARY ANGIOGRAM N/A 11/18/2013   Procedure: LEFT HEART CATHETERIZATION WITH CORONARY ANGIOGRAM;  Surgeon: Sinclair Grooms, MD;  Location: [facility] CATH LAB;  Service:  Cardiovascular;  Laterality: N/A;   PERCUTANEOUS CORONARY STENT INTERVENTION (PCI-S)  11/18/2013   Procedure: PERCUTANEOUS CORONARY STENT INTERVENTION (PCI-S);  Surgeon: Sinclair Grooms, MD;  Location: [facility] C[facility] LAB;  Service: Cardiovascular;;   SHOULDER ARTHROSCOPY WITH OPEN ROTATOR CUFF REPAIR Right 11/24/2018   Procedure: SHOULDER ARTHROSCOPY WITH OPEN MINI ROTATOR CUFF REPAIR, SUBACROMIAL DECOMPRESSION, DISTAL CLAVICLE EXCISION, BICEPS TENODESIS;  Surgeon: Thornton Park, MD;  Location: [facility];  Service: Orthopedics;  Laterality: Right;    Current Medications: Current Meds  Medication Sig   ALPRAZolam (XANAX) 0.5 MG tablet Take 0.5 mg by mouth 2 (two) times daily as needed for anxiety.   amLODipine (NORVASC) 10 MG tablet TAKE ONE TABLET BY MOUTH ONE TIME DAILY   aspirin EC 81 MG tablet Take 81 mg by mouth daily.   atorvastatin (LIPITOR) 80 MG tablet TAKE ONE TABLET BY MOUTH EVERY EVENING   clopidogrel (PLAVIX) 75 MG tablet Take 1 tablet (75 mg total) by mouth daily.   diclofenac sodium (VOLTAREN) 1 % GEL Apply 1 application  topically 3 (three) times daily. Apply to feet   ezetimibe (ZETIA) 10 MG tablet Take 1 tablet (10 mg total) by mouth daily.   hydrOXYzine (VISTARIL) 25 MG capsule Take 50 mg by mouth every 8 (eight) hours as needed.   losartan-hydrochlorothiazide (HYZAAR) 100-25 MG tablet Take 1 tablet by mouth daily.   naloxone (NARCAN) nasal spray 4 mg/0.1 mL SMARTSIG:Both Nares Once a Week   Oxycodone HCl 10 MG TABS Take 10 mg by mouth in the morning, at noon, in the evening, and at bedtime. Break pill in half, takes PRN through out day   polycarbophil (FIBERCON) 625 MG tablet Take 1,250 mg by mouth daily.   sertraline (ZOLOFT) 25 MG tablet Take 75 mg by mouth daily.   silodosin (RAPAFLO) 8 MG CAPS capsule Take 1 capsule (8 mg total) by mouth daily with breakfast.   zolpidem (AMBIEN) 10 MG tablet Take 10 mg by mouth at bedtime.    Allergies:   Patient has no known allergies.    Social History   Socioeconomic History   Marital status: Married    Spouse name: Not on file   Number of children: Not on file   Years of education: Not on file   Highest education level: Not on file  Occupational History   Not on file  Tobacco Use   Smoking status: Never   Smokeless tobacco: Never  Vaping Use   Vaping Use: Never used  Substance and Sexual Activity   Alcohol use: Not Currently    Alcohol/week: 2.0 standard drinks of alcohol    Types: 2 Glasses of wine per week   Drug use: No   Sexual activity: Yes  Other Topics Concern   Not on file  Social History Narrative   Not on file   Social Determinants of Health   Financial Resource Strain: Not on file  Food Insecurity: Not on file  Transportation Needs: Not on file  Physical Activity: Not on file  Stress: Not on file  Social Connections: Not on file     Family History:  The patient's family history includes Coronary artery disease (age of onset: 27) in his father; Coronary artery disease (age of onset: 41) in his brother; Kidney cancer in his brother; Lupus in his daughter; Non-Hodgkin's lymphoma in his mother.  ROS:   12-point review of systems is negative unless otherwise noted in the HPI.   EKGs/Labs/Other Studies Reviewed:    Studies reviewed were summarized above. The additional studies were reviewed today:  LHC 02/12/2021:   Prox RCA to Mid RCA lesion is 100% stenosed.   Ost RCA to Prox RCA lesion is 50% stenosed.   Mid Cx lesion is 10% stenosed.   Mid Cx to Dist Cx lesion is 50% stenosed.   3rd Mrg lesion is 40% stenosed.   1st Mrg lesion is 90% stenosed.   Prox LAD to Mid LAD lesion is 40% stenosed.   Prox Cx to Mid Cx lesion is 70% stenosed.   Previously placed Mid LAD stent (unknown type) is  widely patent.   A drug-eluting stent was successfully placed using a STENT ONYX FRONTIER 3.0X18.   Post intervention, there is a 0% residual stenosis.   The left ventricular systolic function is  normal.   LV end diastolic pressure is normal.   The left ventricular ejection fraction is 55-65% by visual estimate.   1.  Significant underlying three-vessel coronary artery disease with patent stents in the left circumflex and LAD.  Chronically occluded  right coronary artery with left-to-right collaterals.  There is 70% stenosis in the proximal/mid left circumflex at the proximal edge of the previously placed stent.  This was significant by fractional flow reserve evaluation with an IFR ratio of 0.85.  There is also moderate disease in the left circumflex distal to the old stent.  This was not significant by fractional flow reserve evaluation after treating the more proximal lesion.  OM1 is small in diameter with severe diffuse disease.  2.  Normal LV systolic function and high normal left ventricular end-diastolic pressure. 3.  Successful drug-eluting stent placement to the proximal/mid left circumflex overlapping the previously placed stent.   Recommendations: Continue dual antiplatelet therapy indefinitely if tolerated given multiple stents. Aggressive treatment of risk factors. __________   2D echo 05/05/2020: 1. Left ventricular ejection fraction, by estimation, is 50 to 55%. The  left ventricle has low normal function. Parasternal short axis images  suggestive of basal inferior wall hypokineis. There is mild left  ventricular hypertrophy. Left ventricular  diastolic parameters are consistent with Grade I diastolic dysfunction  (impaired relaxation).   2. Right ventricular systolic function is normal. The right ventricular  size is normal.   3. Left atrial size was moderately dilated.   4. The aortic valve is normal in structure.Trileaflet. Aortic valve  regurgitation is not visualized. No aortic stenosis is present.   5. Challenging images.    EKG:  EKG is ordered today.  The EKG ordered today demonstrates NSR, 63 bpm, occasional PVCs, poor R wave progression along the precordial  leads, no acute ST-T changes  Recent Labs: 01/29/2021: TSH 4.910 02/26/2021: BUN 9; Creatinine, Ser 1.06; Hemoglobin 15.9; Platelets 136; Potassium 3.9; Sodium 144 04/24/2021: ALT 24  Recent Lipid Panel    Component Value Date/Time   CHOL 112 04/24/2021 1102   TRIG 147 04/24/2021 1102   HDL 30 (L) 04/24/2021 1102   CHOLHDL 3.7 04/24/2021 1102   CHOLHDL 3.7 11/18/2013 0246   VLDL 38 11/18/2013 0246   LDLCALC 56 04/24/2021 1102   LDLDIRECT 59 04/24/2021 1102    PHYSICAL EXAM:    VS:  BP (!) 140/78 (BP Location: Left Arm, Patient Position: Sitting, Cuff Size: Large)   Pulse 63   Ht 5\' 6"  (1.676 m)   Wt 196 lb 6.4 oz (89.1 kg)   SpO2 97%   BMI 31.70 kg/m   BMI: Body mass index is 31.7 kg/m.  Physical Exam Vitals reviewed.  Constitutional:      Appearance: He is well-developed.  HENT:     Head: Normocephalic and atraumatic.  Eyes:     General:        Right eye: No discharge.        Left eye: No discharge.  Neck:     Vascular: No JVD.  Cardiovascular:     Rate and Rhythm: Normal rate and regular rhythm.     Pulses:          Posterior tibial pulses are 2+ on the right side and 2+ on the left side.     Heart sounds: Normal heart sounds, S1 normal and S2 normal. Heart sounds not distant. No midsystolic click and no opening snap. No murmur heard.    No friction rub.  Pulmonary:     Effort: Pulmonary effort is normal. No respiratory distress.     Breath sounds: Normal breath sounds. No decreased breath sounds, wheezing or rales.  Chest:     Chest wall: No tenderness.  Abdominal:  General: There is no distension.  Musculoskeletal:     Cervical back: Normal range of motion.     Right lower leg: No edema.     Left lower leg: No edema.  Skin:    General: Skin is warm and dry.     Nails: There is no clubbing.  Neurological:     Mental Status: He is alert and oriented to person, place, and time.  Psychiatric:        Speech: Speech normal.        Behavior: Behavior  normal.        Thought Content: Thought content normal.        Judgment: Judgment normal.     Wt Readings from Last 3 Encounters:  12/04/21 196 lb 6.4 oz (89.1 kg)  08/06/21 190 lb (86.2 kg)  06/20/21 190 lb (86.2 kg)     ASSESSMENT & PLAN:   CAD involving native coronary arteries without angina: He is doing very well without symptoms concerning for angina.  Continue DAPT with ASA and clopidogrel, ideally indefinitely, if tolerated, given multiple stents along with atorvastatin, ezetimibe, and losartan.  Aggressive risk factor modification.  No indication for further ischemic testing at this time.  History of HFrEF secondary to ICM with subsequent normalization of LV systolic function: He remains euvolemic and well compensated.  Not currently on beta-blocker secondary to resting sinus bradycardia.  He remains on losartan.  Not requiring a standing diuretic.  HTN: Blood pressure is mildly elevated at triage, though typically well controlled in the 1 teens to 120s.  He remains on amlodipine and losartan/HCTZ.  Continue low-sodium diet.  HLD: LDL 59 in 04/2021.  He remains on atorvastatin 80 mg and ezetimibe.  PVCs: Asymptomatic.  He remains off beta-blocker given resting heart rates typically in the 50s to 60s bpm.  Improved ventricular ectopy on auscultation today.  OSA: Followed by Dr. Radford Pax.   Disposition: F/u with Dr. Fletcher Anon or an APP in 9 months.   Medication Adjustments/Labs and Tests Ordered: Current medicines are reviewed at length with the patient today.  Concerns regarding medicines are outlined above. Medication changes, Labs and Tests ordered today are summarized above and listed in the Patient Instructions accessible in Encounters.   Signed, Christell Faith, PA-C 12/04/2021 10:23 AM     [facility] [address] [place], [place] [address] [phone]

## 2021-12-19 ENCOUNTER — Telehealth: Payer: Self-pay | Admitting: Cardiology

## 2021-12-19 ENCOUNTER — Encounter (HOSPITAL_BASED_OUTPATIENT_CLINIC_OR_DEPARTMENT_OTHER): Payer: Self-pay | Admitting: Cardiology

## 2021-12-19 NOTE — Telephone Encounter (Signed)
Patient states he send a message on mychart for dr turner. Wanted to make sure she see its because last time it took several weeks for a response. Please advise

## 2021-12-20 NOTE — Telephone Encounter (Signed)
Working on the problem waiting for a call back from [facility].

## 2021-12-21 NOTE — Telephone Encounter (Signed)
Patient finally got a call from [facility] that they have a Bipap machine for him and he has an appointment on Monday 9/18.

## 2021-12-31 DIAGNOSIS — I10 Essential (primary) hypertension: Secondary | ICD-10-CM | POA: Diagnosis not present

## 2021-12-31 DIAGNOSIS — G4733 Obstructive sleep apnea (adult) (pediatric): Secondary | ICD-10-CM | POA: Diagnosis not present

## 2022-01-07 DIAGNOSIS — Z5181 Encounter for therapeutic drug level monitoring: Secondary | ICD-10-CM | POA: Diagnosis not present

## 2022-01-07 DIAGNOSIS — G4700 Insomnia, unspecified: Secondary | ICD-10-CM | POA: Diagnosis not present

## 2022-01-07 DIAGNOSIS — D696 Thrombocytopenia, unspecified: Secondary | ICD-10-CM | POA: Diagnosis not present

## 2022-01-07 DIAGNOSIS — F419 Anxiety disorder, unspecified: Secondary | ICD-10-CM | POA: Diagnosis not present

## 2022-01-07 DIAGNOSIS — E7800 Pure hypercholesterolemia, unspecified: Secondary | ICD-10-CM | POA: Diagnosis not present

## 2022-01-07 DIAGNOSIS — R739 Hyperglycemia, unspecified: Secondary | ICD-10-CM | POA: Diagnosis not present

## 2022-01-17 ENCOUNTER — Ambulatory Visit: Payer: Medicare Other | Admitting: Urology

## 2022-01-17 ENCOUNTER — Encounter: Payer: Self-pay | Admitting: Urology

## 2022-01-17 VITALS — BP 144/80 | HR 72 | Ht 66.0 in | Wt 192.0 lb

## 2022-01-17 DIAGNOSIS — N401 Enlarged prostate with lower urinary tract symptoms: Secondary | ICD-10-CM | POA: Diagnosis not present

## 2022-01-17 DIAGNOSIS — N138 Other obstructive and reflux uropathy: Secondary | ICD-10-CM

## 2022-01-17 DIAGNOSIS — R3912 Poor urinary stream: Secondary | ICD-10-CM

## 2022-01-17 LAB — BLADDER SCAN AMB NON-IMAGING: Scan Result: 13

## 2022-01-17 MED ORDER — SILODOSIN 8 MG PO CAPS: 8.0000 mg | ORAL_CAPSULE | Freq: Every day | ORAL | 3 refills | Status: DC

## 2022-01-17 NOTE — Patient Instructions (Signed)
Prostatic Urethral Lift  Prostatic urethral lift is a surgical procedure to treat symptoms of prostate gland enlargement that occurs with age (benign prostatic hypertrophy, BPH). The urethra passes between the two lobes of the prostate. The urethra is the part of the body that drains urine from the bladder. As the prostate enlarges, it can push on the urethra and cause problems with urinating. This procedure involves placing an implant that holds the prostate away from the urethra. The procedure is done using a thin device called a cystoscope. The device is inserted through the tip of the penis and moved up the urethra to the prostate. This is less invasive than other procedures that require an incision. You may have this procedure if: You have symptoms of BPH. Your prostate is not severely enlarged. Medicines to treat BPH are not working or not tolerated. You want to avoid possible sexual side effects from medicines or other procedures that are used to treat BPH. Tell a health care provider about: Any allergies you have. All medicines you are taking, including vitamins, herbs, eye drops, creams, and over-the-counter medicines. Any problems you or family members have had with anesthetic medicines. Any bleeding problems you have. Any surgeries you have had. Any medical conditions you have. What are the risks? Generally, this is a safe procedure. However, problems may occur, including: Bleeding. Infection. Leaking of urine (incontinence). Allergic reactions to medicines. Return of BPH symptoms after 2 years, requiring more treatment. What happens before the procedure? When to stop eating and drinking Follow instructions from your health care provider about what you may eat and drink before your procedure. These may include: 8 hours before your procedure Stop eating most foods. Do not eat meat, fried foods, or fatty foods. Eat only light foods, such as toast or crackers. All liquids are  okay except energy drinks and alcohol. 6 hours before your procedure Stop eating. Drink only clear liquids, such as water, clear fruit juice, black coffee, plain tea, and sports drinks. Do not drink energy drinks or alcohol. 2 hours before your procedure Stop drinking all liquids. You may be allowed to take medicines with small sips of water. If you do not follow your health care provider's instructions, your procedure may be delayed or canceled. Medicines Ask your health care provider about: Changing or stopping your regular medicines. This is especially important if you are taking diabetes medicines or blood thinners. Taking medicines such as aspirin and ibuprofen. These medicines can thin your blood. Do not take these medicines unless your health care provider tells you to take them. Taking over-the-counter medicines, vitamins, herbs, and supplements. Surgery safety Ask your health care provider what steps will be taken to help prevent infection. These steps may include: Removing hair at the surgery site. Washing skin with a germ-killing soap. Taking antibiotic medicine. General instructions Do not use any products that contain nicotine or tobacco for at least 4 weeks before the procedure. These products include cigarettes, chewing tobacco, and vaping devices, such as e-cigarettes. If you need help quitting, ask your health care provider. If you will be going home right after the procedure, plan to have a responsible adult: Take you home from the hospital or clinic. You will not be allowed to drive. Care for you for the time you are told. What happens during the procedure? An IV may be inserted into one of your veins. You will be given one or more of the following: A medicine to help you relax (sedative). A medicine that   is injected into your urethra to numb the area (local anesthetic). A medicine to make you fall asleep (general anesthetic). A cystoscope will be inserted into your  penis and moved through your urethra to your prostate. A device will be inserted through the cystoscope and used to press the lobes of your prostate away from your urethra. Implants will be inserted through the device to hold the lobes of your prostate in the widened position. The device and cystoscope will be removed. The procedure may vary among health care providers and hospitals. What happens after the procedure? Your blood pressure, heart rate, breathing rate, and blood oxygen level be monitored until you leave the hospital or clinic. If you were given a sedative during the procedure, it can affect you for several hours. Do not drive or operate machinery until your health care provider says that it is safe. Summary Prostatic urethral lift is a surgical procedure to relieve symptoms of prostate gland enlargement that occurs with age (benign prostatic hypertrophy, BPH). The procedure is performed with a thin device called a cystoscope. This device is inserted through the tip of the penis and moved up the urethra to reach the prostate. This is less invasive than other procedures that require an incision. If you will be going home right after the procedure, plan to have a responsible adult take you home from the hospital or clinic. You will not be allowed to drive. This information is not intended to replace advice given to you by your health care provider. Make sure you discuss any questions you have with your health care provider. Document Revised: 10/20/2020 Document Reviewed: 10/20/2020 Elsevier Patient Education  [employer].  Holmium Laser Enucleation of the Prostate (HoLEP)  HoLEP is a treatment for men with benign prostatic hyperplasia (BPH). The laser surgery removed blockages of urine flow, and is done without any incisions on the body.     What is HoLEP?  HoLEP is a type of laser surgery used to treat obstruction (blockage) of urine flow as a result of benign prostatic  hyperplasia (BPH). In men with BPH, the prostate gland is not cancerous, but has become enlarged. An enlarged prostate can result in a number of urinary tract symptoms such as weak urinary stream, difficulty in starting urination, inability to urinate, frequent urination, or getting up at night to urinate.  HoLEP was developed in the 1990's as a more effective and less expensive surgical option for BPH, compared to other surgical options such as laser vaporization(PVP/greenlight laser), transurethral resection of the prostate(TURP), and open simple prostatectomy.   What happens during a HoLEP?  HoLEP requires general anesthesia ("asleep" throughout the procedure).   An antibiotic is given to reduce the risk of infection  A surgical instrument called a resectoscope is inserted through the urethra (the tube that carries urine from the bladder). The resectoscope has a camera that allows the surgeon to view the internal structure of the prostate gland, and to see where the incisions are being made during surgery.  The laser is inserted into the resectoscope and is used to enucleate (free up) the enlarged prostate tissue from the capsule (outer shell) and then to seal up any blood vessels. The tissue that has been removed is pushed back into the bladder.  A morcellator is placed through the resectoscope, and is used to suction out the prostate tissue that has been pushed into the bladder.  When the prostate tissue has been removed, the resectoscope is removed, and a foley  catheter is placed to allow healing and drain the urine from the bladder.     What happens after a HoLEP?  More than 90% of patients go home the same day a few hours after surgery. Less than 10% will be admitted to the hospital overnight for observation to monitor the urine, or if they have other medical problems.  Fluid is flushed through the catheter for about 1 hour after surgery to clear any blood from the urine. It is normal  to have some blood in the urine after surgery. The need for blood transfusion is extremely rare.  Eating and drinking are permitted after the procedure once the patient has fully awakened from anesthesia.  The catheter is usually removed 2-3 days after surgery- the patient will come to clinic to have the catheter removed and make sure they can urinate on their own.  It is very important to drink lots of fluids after surgery for one week to keep the bladder flushed.  At first, there may be some burning with urination, but this typically improved within a few hours to days. Most patients do not have a significant amount of pain, and narcotic pain medications are rarely needed.  Symptoms of urinary frequency, urgency, and even leakage are NORMAL for the first few weeks after surgery as the bladder adjusts after having to work hard against blockage from the prostate for many years. This will improve, but can sometimes take several months.  The use of pelvic floor exercises (Kegel exercises) can help improve problems with urinary incontinence.   After catheter removal, patients will be seen at 6 weeks and 6 months for symptom check  No heavy lifting for at least 2-3 weeks after surgery, however patients can walk and do light activities the first day after surgery. Return to work time depends on occupation.    What are the advantages of HoLEP?  HoLEP has been studied in many different parts of the world and has been shown to be a safe and effective procedure. Although there are many types of BPH surgeries available, HoLEP offers a unique advantage in being able to remove a large amount of tissue without any incisions on the body, even in very large prostates, while decreasing the risk of bleeding and providing tissue for pathology (to look for cancer). This decreases the need for blood transfusions during surgery, minimizes hospital stay, and reduces the risk of needing repeat treatment.  What are  the side effects of HoLEP?  Temporary burning and bleeding during urination. Some blood may be seen in the urine for weeks after surgery and is part of the healing process.  Urinary incontinence (inability to control urine flow) is expected in all patients immediately after surgery and they should wear pads for the first few days/weeks. This typically improves over the course of several weeks. Performing Kegel exercises can help decrease leakage from stress maneuvers such as coughing, sneezing, or lifting. The rate of long term leakage is very low. Patients may also have leakage with urgency and this may be treated with medication. The risk of urge incontinence can be dependent on several factors including age, prostate size, symptoms, and other medical problems.  Retrograde ejaculation or "backwards ejaculation." In 75% of cases, the patient will not see any fluid during ejaculation after surgery.  Erectile function is generally not significantly affected.   What are the risks of HoLEP?  Injury to the urethra or development of scar tissue at a later date  Injury to  the capsule of the prostate (typically treated with longer catheterization).  Injury to the bladder or ureteral orifices (where the urine from the kidney drains out)  Infection of the bladder, testes, or kidneys  Return of urinary obstruction at a later date requiring another operation (<2%)  Need for blood transfusion or re-operation due to bleeding  Failure to relieve all symptoms and/or need for prolonged catheterization after surgery  5-15% of patients are found to have previously undiagnosed prostate cancer in their specimen. Prostate cancer can be treated after HoLEP.  Standard risks of anesthesia including blood clots, heart attacks, etc  When should I call my doctor?  Fever over 101.3 degrees  Inability to urinate, or large blood clots in the urine

## 2022-01-17 NOTE — Progress Notes (Signed)
   01/17/2022 8:32 AM   Terry Cook December 23, 1950 [id number]  Reason for visit: Follow up BPH  HPI: 71 year old male previously followed by Dr. Junious Silk for long-term BPH.  He currently is on silodosin 8 mg daily with excellent results.  He really has no complaints about the urination today.  IPSS score today is 3, with quality of life pleased, and PVR is normal at 49mL.  He has sleep apnea and uses CPAP, and is not getting up at all overnight.  He previously underwent a normal cystoscopy in 2020, as well as a normal renal/bladder ultrasound.  No prostate volume available.  PSA previously normal at 2.7.  We reviewed the AUA guidelines that do not recommend routine screening in men over age 57.  He would like to continue the silodosin for BPH.  We reviewed outlet procedures including UroLift or HOLEP, and that optimal procedures based on prostate volume.  Risks and benefits of both procedures discussed at length. We discussed the risks and benefits of HoLEP at length.  The procedure requires general anesthesia and takes 1 to 2 hours, and a holmium laser is used to enucleate the prostate and push this tissue into the bladder.  A morcellator is then used to remove this tissue, which is sent for pathology.  The vast majority(>95%) of patients are able to discharge the same day with a catheter in place for 2 to 3 days, and will follow-up in clinic for a voiding trial.  We specifically discussed the risks of bleeding, infection, retrograde ejaculation, temporary urgency and urge incontinence, very low risk of long-term incontinence, urethral stricture/bladder neck contracture, pathologic evaluation of prostate tissue and possible detection of prostate cancer or other malignancy, and possible need for additional procedures.  He would like to continue silodosin for BPH now, but may be interested in outlet procedures in the future.  Would need TRUS prior to establish prostate volume  Silodosin refilled RTC  1 year PVR  Billey Co, MD  [facility] [address] [place], [place] [address] [phone]

## 2022-01-30 DIAGNOSIS — I10 Essential (primary) hypertension: Secondary | ICD-10-CM | POA: Diagnosis not present

## 2022-01-30 DIAGNOSIS — G4733 Obstructive sleep apnea (adult) (pediatric): Secondary | ICD-10-CM | POA: Diagnosis not present

## 2022-02-04 DIAGNOSIS — F1120 Opioid dependence, uncomplicated: Secondary | ICD-10-CM | POA: Diagnosis not present

## 2022-02-04 DIAGNOSIS — M47816 Spondylosis without myelopathy or radiculopathy, lumbar region: Secondary | ICD-10-CM | POA: Diagnosis not present

## 2022-03-02 DIAGNOSIS — I10 Essential (primary) hypertension: Secondary | ICD-10-CM | POA: Diagnosis not present

## 2022-03-02 DIAGNOSIS — G4733 Obstructive sleep apnea (adult) (pediatric): Secondary | ICD-10-CM | POA: Diagnosis not present

## 2022-03-04 ENCOUNTER — Other Ambulatory Visit: Payer: Self-pay | Admitting: Cardiovascular Disease

## 2022-04-01 DIAGNOSIS — G4733 Obstructive sleep apnea (adult) (pediatric): Secondary | ICD-10-CM | POA: Diagnosis not present

## 2022-04-01 DIAGNOSIS — I10 Essential (primary) hypertension: Secondary | ICD-10-CM | POA: Diagnosis not present

## 2022-05-02 DIAGNOSIS — Z6831 Body mass index (BMI) 31.0-31.9, adult: Secondary | ICD-10-CM | POA: Diagnosis not present

## 2022-05-02 DIAGNOSIS — M47816 Spondylosis without myelopathy or radiculopathy, lumbar region: Secondary | ICD-10-CM | POA: Diagnosis not present

## 2022-05-02 DIAGNOSIS — F1120 Opioid dependence, uncomplicated: Secondary | ICD-10-CM | POA: Diagnosis not present

## 2022-05-03 DIAGNOSIS — I10 Essential (primary) hypertension: Secondary | ICD-10-CM | POA: Diagnosis not present

## 2022-05-03 DIAGNOSIS — G4733 Obstructive sleep apnea (adult) (pediatric): Secondary | ICD-10-CM | POA: Diagnosis not present

## 2022-05-13 ENCOUNTER — Other Ambulatory Visit: Payer: Self-pay

## 2022-05-13 ENCOUNTER — Other Ambulatory Visit: Payer: Self-pay | Admitting: Cardiovascular Disease

## 2022-05-13 MED ORDER — EZETIMIBE 10 MG PO TABS: 10.0000 mg | ORAL_TABLET | Freq: Every day | ORAL | 0 refills | Status: DC

## 2022-05-21 ENCOUNTER — Other Ambulatory Visit: Payer: Self-pay | Admitting: Cardiovascular Disease

## 2022-06-03 DIAGNOSIS — G4733 Obstructive sleep apnea (adult) (pediatric): Secondary | ICD-10-CM | POA: Diagnosis not present

## 2022-06-03 DIAGNOSIS — I10 Essential (primary) hypertension: Secondary | ICD-10-CM | POA: Diagnosis not present

## 2022-06-12 ENCOUNTER — Other Ambulatory Visit: Payer: Self-pay | Admitting: Cardiovascular Disease

## 2022-07-02 DIAGNOSIS — I10 Essential (primary) hypertension: Secondary | ICD-10-CM | POA: Diagnosis not present

## 2022-07-02 DIAGNOSIS — G4733 Obstructive sleep apnea (adult) (pediatric): Secondary | ICD-10-CM | POA: Diagnosis not present

## 2022-07-04 DIAGNOSIS — Z0000 Encounter for general adult medical examination without abnormal findings: Secondary | ICD-10-CM | POA: Diagnosis not present

## 2022-07-10 DIAGNOSIS — F1120 Opioid dependence, uncomplicated: Secondary | ICD-10-CM | POA: Diagnosis not present

## 2022-07-10 DIAGNOSIS — M47816 Spondylosis without myelopathy or radiculopathy, lumbar region: Secondary | ICD-10-CM | POA: Diagnosis not present

## 2022-07-10 DIAGNOSIS — Z6832 Body mass index (BMI) 32.0-32.9, adult: Secondary | ICD-10-CM | POA: Diagnosis not present

## 2022-07-11 DIAGNOSIS — F339 Major depressive disorder, recurrent, unspecified: Secondary | ICD-10-CM | POA: Diagnosis not present

## 2022-07-11 DIAGNOSIS — I10 Essential (primary) hypertension: Secondary | ICD-10-CM | POA: Diagnosis not present

## 2022-07-11 DIAGNOSIS — I700 Atherosclerosis of aorta: Secondary | ICD-10-CM | POA: Diagnosis not present

## 2022-07-11 DIAGNOSIS — D696 Thrombocytopenia, unspecified: Secondary | ICD-10-CM | POA: Diagnosis not present

## 2022-08-24 ENCOUNTER — Other Ambulatory Visit: Payer: Self-pay | Admitting: Cardiovascular Disease

## 2022-08-26 NOTE — Telephone Encounter (Signed)
Please schedule F/U appointment for 90 day refills. Thank you! 

## 2022-08-28 NOTE — Telephone Encounter (Signed)
LMOV to schedule  

## 2022-09-12 DIAGNOSIS — G4733 Obstructive sleep apnea (adult) (pediatric): Secondary | ICD-10-CM | POA: Diagnosis not present

## 2022-09-12 DIAGNOSIS — I10 Essential (primary) hypertension: Secondary | ICD-10-CM | POA: Diagnosis not present

## 2022-10-01 NOTE — Telephone Encounter (Signed)
Left message on VM.

## 2022-10-07 DIAGNOSIS — F1120 Opioid dependence, uncomplicated: Secondary | ICD-10-CM | POA: Diagnosis not present

## 2022-10-07 DIAGNOSIS — M47816 Spondylosis without myelopathy or radiculopathy, lumbar region: Secondary | ICD-10-CM | POA: Diagnosis not present

## 2022-10-07 DIAGNOSIS — Z6832 Body mass index (BMI) 32.0-32.9, adult: Secondary | ICD-10-CM | POA: Diagnosis not present

## 2022-10-12 DIAGNOSIS — I10 Essential (primary) hypertension: Secondary | ICD-10-CM | POA: Diagnosis not present

## 2022-10-12 DIAGNOSIS — G4733 Obstructive sleep apnea (adult) (pediatric): Secondary | ICD-10-CM | POA: Diagnosis not present

## 2022-10-31 ENCOUNTER — Other Ambulatory Visit: Payer: Self-pay | Admitting: Cardiovascular Disease

## 2022-10-31 ENCOUNTER — Other Ambulatory Visit: Payer: Self-pay

## 2022-10-31 MED ORDER — EZETIMIBE 10 MG PO TABS: 10.0000 mg | ORAL_TABLET | Freq: Every day | ORAL | 0 refills | Status: DC

## 2022-10-31 MED ORDER — AMLODIPINE BESYLATE 10 MG PO TABS: 10.0000 mg | ORAL_TABLET | Freq: Every day | ORAL | 3 refills | Status: DC

## 2022-11-01 ENCOUNTER — Other Ambulatory Visit: Payer: Self-pay | Admitting: Cardiovascular Disease

## 2022-11-12 DIAGNOSIS — G4733 Obstructive sleep apnea (adult) (pediatric): Secondary | ICD-10-CM | POA: Diagnosis not present

## 2022-11-12 DIAGNOSIS — I10 Essential (primary) hypertension: Secondary | ICD-10-CM | POA: Diagnosis not present

## 2022-11-21 NOTE — Progress Notes (Unsigned)
Cardiology Office Note    Date:  11/25/2022   ID:  TSERING MARDIROSSIAN, DOB 02-05-1951, MRN [id number]  PCP:  Ileana Ladd, MD (Inactive)  Cardiologist:  Lorine Bears, MD  Electrophysiologist:  None   Chief Complaint: Follow-up  History of Present Illness:   Terry Cook is a 72 y.o. male with history of CAD with prior MI in 2013 status post PCI to the LCx with unstable angina in 2015 s/p PCI to the LAD with PCI to the proximal/mid LCx overlapping the previously placed stent in 02/2021, ICM with subsequent improvement in LV systolic function, strong family history of premature coronary artery disease, PVCs, HTN, HLD, anxiety, BPH, OSA on CPAP, arthritis, and chronic low back pain who presents for follow-up of his CAD and ICM.  He was admitted with an MI in 2013.  He underwent PCI to the LCx and was found to have a chronically occluded RCA with left-to-right collaterals at that time.  EF at that time was 40 to 45%.  He presented with unstable angina in 2015 and was found to have a patent LCx stent, though there was 90% stenosis in the mid LAD which was treated successfully with PCI/DES.  He was seen in the office in 04/2020 and was doing reasonably well.  Over the preceding year, he did report 2 brief episodes of chest discomfort.  There were no exertional symptoms with recommendation to continue medical therapy.  He underwent echo in 04/2020 which showed an improvement in his LV systolic function with a low normal EF of 50 to 55%, possible basal inferior wall hypokinesis, mild LVH, grade 1 diastolic dysfunction, normal RV systolic function and ventricular cavity size, moderately dilated left atrium, and no noted significant valvular abnormalities.  He was seen in the office on 01/29/2021 noting intermittent randomly occurring profound fatigue/exhaustion as well as chest pressure. These symptoms felt similar to his prior angina. Given symptoms, he underwent LHC on 02/12/2021 which demonstrated  significant 3-vessel CAD with patent stents in the LAD and LCx. The RCA was chronically occluded with left-to-right collaterals. There was 70% stenosis in the proximal/mid LCx at the proximal edge of the previously placed stent. This was significant by fractional flow reserve evaluation with an iFR ratio of 0.85. There was also moderate disease in the LCx distal to the old stent. This was not significant by fractional flow reserve evaluation after treating the more proximal lesion. The OM1 was small in diameter with severe diffuse disease. Normal LV systolic function and high normal left ventricular end-diastolic pressure. He underwent successful drug-eluting stent placement to the proximal/mid LCx overlapping the previously placed stent.  Post cath, he felt much better.  Renal artery ultrasound in 03/2021 was negative for RAS bilaterally.  He was last seen in the office in 11/2021 and was without episodes of angina or cardiac decompensation.  He remained active at baseline with main limiting factor noted to be chronic back pain.  No changes were indicated at that time.  He comes in doing well from a cardiac perspective.  Since he was last seen he has had a couple episodes of profound substernal chest tightness with associated profound exhaustion and dyspnea.  Symptoms are similar to what he experienced leading up to his MI and prior PCI.  These episodes were short-lived and spontaneously resolved, however they did concern him given the level of exhaustion and consistency with prior angina.  He has been without exertional symptoms.  He does note bilateral lower extremity  ankle swelling if he is up on his feet on a hard floor for prolonged time frames.  He also notes intermittent palpitations and a history of irregular heartbeat.  Sometimes skips losartan/HCTZ if it is later in the day so he is not up avoiding during the nighttime hours.  Continues to note back pain.  Adherent to CPAP, has new machine.  Feels well  today and is currently without symptoms of angina or cardiac decompensation.   Labs independently reviewed: 04/2021 - direct LDL 59, TC 112, TG 147, HDL 30, albumin 4.6, AST/ALT normal 02/2021 - Hgb 15.9, PLT 136, BUN 9, serum creatinine 1.06, potassium 3.9  Past Medical History:  Diagnosis Date   Anxiety    CAD (coronary artery disease), native coronary artery 06/04/2011   s/p MI with PCI to left circ   Chronic lower back pain    DDD (degenerative disc disease), lumbar    Depression    High cholesterol    History of kidney stones    Hypertension    Irregular heart beats    MI (myocardial infarction) (HCC) 05/2011   OSA on CPAP     Past Surgical History:  Procedure Laterality Date   COLONOSCOPY     CORONARY ANGIOPLASTY WITH STENT PLACEMENT  05/2011   "1"   CORONARY PRESSURE/FFR WITH 3D MAPPING N/A 02/12/2021   Procedure: Coronary Pressure Wire/FFR w/3D Mapping;  Surgeon: Iran Ouch, MD;  Location: [facility] INVASIVE CV [facility];  Service: Cardiovascular;  Laterality: N/A;   CORONARY STENT INTERVENTION N/A 02/12/2021   Procedure: CORONARY STENT INTERVENTION;  Surgeon: Iran Ouch, MD;  Location: [facility] INVAS[facility] CV [facility];  Service: Cardiovascular;  Laterality: N/A;   CYST EXCISION Right ~ 2009   "index finger; in office"   CYSTOSCOPY W/ STONE MANIPULATION  ~ 1975 X 2   LEFT HEART CATH AND CORONARY ANGIOGRAPHY Left 02/12/2021   Procedure: LEFT HEART CATH AND CORONARY ANGIOGRAPHY;  Surgeon: Iran Ouch, MD;  Location: [facility] INV[facility] CV [facility];  Service: Cardiovascular;  Laterality: Left;   LEFT HEART CATHETERIZATION WITH CORONARY ANGIOGRAM N/A 06/04/2011   Procedure: LEFT HEART CATHETERIZATION WITH CORONARY ANGIOGRAM;  Surgeon: Othella Boyer, MD;  Location: [facility] CATH LAB;  Service: Cardiovascular;  Laterality: N/A;   LEFT HEART CATHETERIZATION WITH CORONARY ANGIOGRAM N/A 11/18/2013   Procedure: LEFT HEART CATHETERIZATION WITH CORONARY ANGIOGRAM;  Surgeon: Lesleigh Noe, MD;   Location: [facility] CATH LAB;  Service: Cardiovascular;  Laterality: N/A;   PERCUTANEOUS CORONARY STENT INTERVENTION (PCI-S)  11/18/2013   Procedure: PERCUTANEOUS CORONARY STENT INTERVENTION (PCI-S);  Surgeon: Lesleigh Noe, MD;  Location: [facility] CATH LAB;  Service: Cardiovascular;;   SHOULDER ARTHROSCOPY WITH OPEN ROTATOR CUFF REPAIR Right 11/24/2018   Procedure: SHOULDER ARTHROSCOPY WITH OPEN MINI ROTATOR CUFF REPAIR, SUBACROMIAL DECOMPRESSION, DISTAL CLAVICLE EXCISION, BICEPS TENODESIS;  Surgeon: Juanell Fairly, MD;  Location: [facility] ORS;  Service: Orthopedics;  Laterality: Right;    Current Medications: Current Meds  Medication Sig   amLODipine (NORVASC) 10 MG tablet Take 1 tablet (10 mg total) by mouth daily.   aspirin EC 81 MG tablet Take 81 mg by mouth daily.   atorvastatin (LIPITOR) 80 MG tablet TAKE ONE TABLET BY MOUTH EVERY EVENING   clopidogrel (PLAVIX) 75 MG tablet TAKE ONE TABLET BY MOUTH ONE TIME DAILY   diclofenac sodium (VOLTAREN) 1 % GEL Apply 1 application topically 3 (three) times daily. Apply to feet   ezetimibe (ZETIA) 10 MG tablet Take 1 tablet (10 mg total) by  mouth daily.   hydrOXYzine (VISTARIL) 25 MG capsule Take 50 mg by mouth every 8 (eight) hours as needed.   losartan-hydrochlorothiazide (HYZAAR) 100-25 MG tablet Take 1 tablet by mouth daily.   Oxycodone HCl 10 MG TABS Take 10 mg by mouth in the morning, at noon, in the evening, and at bedtime. Break pill in half, takes PRN through out day   polycarbophil (FIBERCON) 625 MG tablet Take 1,250 mg by mouth daily.   sertraline (ZOLOFT) 25 MG tablet Take 75 mg by mouth daily.   silodosin (RAPAFLO) 8 MG CAPS capsule Take 1 capsule (8 mg total) by mouth daily with breakfast.   zolpidem (AMBIEN) 10 MG tablet Take 10 mg by mouth at bedtime.    Allergies:   Patient has no known allergies.   Social History   Socioeconomic History   Marital status: Married    Spouse name: Not on file   Number of children: Not on file   Years  of education: Not on file   Highest education level: Not on file  Occupational History   Not on file  Tobacco Use   Smoking status: Never    Passive exposure: Never   Smokeless tobacco: Never  Vaping Use   Vaping status: Never Used  Substance and Sexual Activity   Alcohol use: Not Currently    Alcohol/week: 2.0 standard drinks of alcohol    Types: 2 Glasses of wine per week   Drug use: No   Sexual activity: Yes  Other Topics Concern   Not on file  Social History Narrative   Not on file   Social Determinants of Health   Financial Resource Strain: Not on file  Food Insecurity: Not on file  Transportation Needs: Not on file  Physical Activity: Not on file  Stress: Not on file  Social Connections: Not on file     Family History:  The patient's family history includes Coronary artery disease (age of onset: 39) in his father; Coronary artery disease (age of onset: 5) in his brother; Kidney cancer in his brother; Lupus in his daughter; Non-Hodgkin's lymphoma in his mother.  ROS:   12-point review of systems is negative unless otherwise noted in the HPI.   EKGs/Labs/Other Studies Reviewed:    Studies reviewed were summarized above. The additional studies were reviewed today:  Renal artery ultrasound 04/05/2021: Right: Normal size right kidney. Normal right Resisitive Index.         Normal cortical thickness of right kidney. No evidence of         right renal artery stenosis. RRV flow present.  Left:  Normal size of left kidney. Normal left Resistive Index.         Normal cortical thickness of the left kidney. No evidence of         left renal artery stenosis. LRV flow present.  Mesenteric:  Normal Celiac artery and Superior Mesenteric artery findings.  __________  LHC 02/12/2021:   Prox RCA to Mid RCA lesion is 100% stenosed.   Ost RCA to Prox RCA lesion is 50% stenosed.   Mid Cx lesion is 10% stenosed.   Mid Cx to Dist Cx lesion is 50% stenosed.   3rd Mrg lesion is  40% stenosed.   1st Mrg lesion is 90% stenosed.   Prox LAD to Mid LAD lesion is 40% stenosed.   Prox Cx to Mid Cx lesion is 70% stenosed.   Previously placed Mid LAD stent (unknown type) is  widely patent.  A drug-eluting stent was successfully placed using a STENT ONYX FRONTIER 3.0X18.   Post intervention, there is a 0% residual stenosis.   The left ventricular systolic function is normal.   LV end diastolic pressure is normal.   The left ventricular ejection fraction is 55-65% by visual estimate.   1.  Significant underlying three-vessel coronary artery disease with patent stents in the left circumflex and LAD.  Chronically occluded right coronary artery with left-to-right collaterals.  There is 70% stenosis in the proximal/mid left circumflex at the proximal edge of the previously placed stent.  This was significant by fractional flow reserve evaluation with an IFR ratio of 0.85.  There is also moderate disease in the left circumflex distal to the old stent.  This was not significant by fractional flow reserve evaluation after treating the more proximal lesion.  OM1 is small in diameter with severe diffuse disease.  2.  Normal LV systolic function and high normal left ventricular end-diastolic pressure. 3.  Successful drug-eluting stent placement to the proximal/mid left circumflex overlapping the previously placed stent.   Recommendations: Continue dual antiplatelet therapy indefinitely if tolerated given multiple stents. Aggressive treatment of risk factors. __________   2D echo 05/05/2020: 1. Left ventricular ejection fraction, by estimation, is 50 to 55%. The  left ventricle has low normal function. Parasternal short axis images  suggestive of basal inferior wall hypokineis. There is mild left  ventricular hypertrophy. Left ventricular  diastolic parameters are consistent with Grade I diastolic dysfunction  (impaired relaxation).   2. Right ventricular systolic function is normal.  The right ventricular  size is normal.   3. Left atrial size was moderately dilated.   4. The aortic valve is normal in structure.Trileaflet. Aortic valve  regurgitation is not visualized. No aortic stenosis is present.   5. Challenging images.    EKG:  EKG is ordered today.  The EKG ordered today demonstrates NSR, 60 bpm, first-degree AV block, baseline artifact, no acute ST-T changes  Recent Labs: No results found for requested labs within last 365 days.  Recent Lipid Panel    Component Value Date/Time   CHOL 112 04/24/2021 1102   TRIG 147 04/24/2021 1102   HDL 30 (L) 04/24/2021 1102   CHOLHDL 3.7 04/24/2021 1102   CHOLHDL 3.7 11/18/2013 0246   VLDL 38 11/18/2013 0246   LDLCALC 56 04/24/2021 1102   LDLDIRECT 59 04/24/2021 1102    PHYSICAL EXAM:    VS:  BP (!) 158/82 (BP Location: Right Arm, Patient Position: Sitting, Cuff Size: Normal)   Pulse 60   Ht 5\' 6"  (1.676 m)   Wt 202 lb (91.6 kg)   SpO2 96%   BMI 32.60 kg/m   BMI: Body mass index is 32.6 kg/m.  Physical Exam Vitals reviewed.  Constitutional:      Appearance: He is well-developed.  HENT:     Head: Normocephalic and atraumatic.  Eyes:     General:        Right eye: No discharge.        Left eye: No discharge.  Neck:     Vascular: No JVD.  Cardiovascular:     Rate and Rhythm: Normal rate and regular rhythm.     Pulses:          Posterior tibial pulses are 2+ on the right side and 2+ on the left side.     Heart sounds: Normal heart sounds, S1 normal and S2 normal. Heart sounds not distant. No midsystolic click and  no opening snap. No murmur heard.    No friction rub.  Pulmonary:     Effort: Pulmonary effort is normal. No respiratory distress.     Breath sounds: Normal breath sounds. No decreased breath sounds, wheezing or rales.  Chest:     Chest wall: No tenderness.  Abdominal:     General: There is no distension.  Musculoskeletal:     Cervical back: Normal range of motion.  Skin:    General:  Skin is warm and dry.     Nails: There is no clubbing.  Neurological:     Mental Status: He is alert and oriented to person, place, and time.  Psychiatric:        Speech: Speech normal.        Behavior: Behavior normal.        Thought Content: Thought content normal.        Judgment: Judgment normal.     Wt Readings from Last 3 Encounters:  11/25/22 202 lb (91.6 kg)  01/17/22 192 lb (87.1 kg)  12/04/21 196 lb 6.4 oz (89.1 kg)     ASSESSMENT & PLAN:   CAD involving the native coronary arteries with unstable angina: Currently without symptoms of angina or cardiac decompensation.  Given concerning symptoms similar to prior angina we have decided to move forward with diagnostic LHC.  Continue DAPT with ASA and clopidogrel, ideally indefinitely given overlapping stents, along with atorvastatin, ezetimibe, and losartan/HCTZ.  Aggressive risk factor modification.  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  He remains on losartan.  Not on beta-blocker secondary to resting sinus bradycardia.  Not requiring a standing loop diuretic, though is on thiazide diuretic.  Update echo with dyspnea and profound exhaustion concerning for anginal equivalent.  Further recommendations regarding medical therapy pending results.  HTN: Blood pressure is mildly elevated.  He does sometimes skip losartan/HCTZ.  Remains on amlodipine 10 mg daily and losartan/HCTZ 100/25 mg daily.  Pending echo results, and BP trend, escalate medical therapy in follow-up.  HLD: LDL 59 in 04/2021.  Remains on atorvastatin 80 mg and ezetimibe.  Check CMP and lipid panel (fasting).  PVCs: Quiescent.  No longer on beta-blocker given resting heart rates typically in the 50s to 60s bpm.  OSA: Adherent to CPAP.   Informed Consent   Shared Decision Making/Informed Consent{  The risks [stroke (1 in 1000), death (1 in 1000), kidney failure [usually temporary] (1 in 500), bleeding (1 in 200), allergic reaction [possibly  serious] (1 in 200)], benefits (diagnostic support and management of coronary artery disease) and alternatives of a cardiac catheterization were discussed in detail with Mr. Tango and he is willing to proceed.       Disposition: F/u with Dr. Kirke Corin or an APP in 2 months.   Medication Adjustments/Labs and Tests Ordered: Current medicines are reviewed at length with the patient today.  Concerns regarding medicines are outlined above. Medication changes, Labs and Tests ordered today are summarized above and listed in the Patient Instructions accessible in Encounters.   Signed, Eula Listen, PA-C 11/25/2022 9:50 AM     [facility] [place] [address] [place], [place] [address] [phone]

## 2022-11-21 NOTE — H&P (View-Only) (Signed)
Cardiology Office Note    Date:  11/25/2022   ID:  Terry Cook, DOB 12-16-1950, MRN [id number]  PCP:  Ileana Ladd, MD (Inactive)  Cardiologist:  Lorine Bears, MD  Electrophysiologist:  None   Chief Complaint: Follow-up  History of Present Illness:   Terry Cook is a 72 y.o. male with history of CAD with prior MI in 2013 status post PCI to the LCx with unstable angina in 2015 s/p PCI to the LAD with PCI to the proximal/mid LCx overlapping the previously placed stent in 02/2021, ICM with subsequent improvement in LV systolic function, strong family history of premature coronary artery disease, PVCs, HTN, HLD, anxiety, BPH, OSA on CPAP, arthritis, and chronic low back pain who presents for follow-up of his CAD and ICM.  He was admitted with an MI in 2013.  He underwent PCI to the LCx and was found to have a chronically occluded RCA with left-to-right collaterals at that time.  EF at that time was 40 to 45%.  He presented with unstable angina in 2015 and was found to have a patent LCx stent, though there was 90% stenosis in the mid LAD which was treated successfully with PCI/DES.  He was seen in the office in 04/2020 and was doing reasonably well.  Over the preceding year, he did report 2 brief episodes of chest discomfort.  There were no exertional symptoms with recommendation to continue medical therapy.  He underwent echo in 04/2020 which showed an improvement in his LV systolic function with a low normal EF of 50 to 55%, possible basal inferior wall hypokinesis, mild LVH, grade 1 diastolic dysfunction, normal RV systolic function and ventricular cavity size, moderately dilated left atrium, and no noted significant valvular abnormalities.  He was seen in the office on 01/29/2021 noting intermittent randomly occurring profound fatigue/exhaustion as well as chest pressure. These symptoms felt similar to his prior angina. Given symptoms, he underwent LHC on 02/12/2021 which demonstrated  significant 3-vessel CAD with patent stents in the LAD and LCx. The RCA was chronically occluded with left-to-right collaterals. There was 70% stenosis in the proximal/mid LCx at the proximal edge of the previously placed stent. This was significant by fractional flow reserve evaluation with an iFR ratio of 0.85. There was also moderate disease in the LCx distal to the old stent. This was not significant by fractional flow reserve evaluation after treating the more proximal lesion. The OM1 was small in diameter with severe diffuse disease. Normal LV systolic function and high normal left ventricular end-diastolic pressure. He underwent successful drug-eluting stent placement to the proximal/mid LCx overlapping the previously placed stent.  Post cath, he felt much better.  Renal artery ultrasound in 03/2021 was negative for RAS bilaterally.  He was last seen in the office in 11/2021 and was without episodes of angina or cardiac decompensation.  He remained active at baseline with main limiting factor noted to be chronic back pain.  No changes were indicated at that time.  He comes in doing well from a cardiac perspective.  Since he was last seen he has had a couple episodes of profound substernal chest tightness with associated profound exhaustion and dyspnea.  Symptoms are similar to what he experienced leading up to his MI and prior PCI.  These episodes were short-lived and spontaneously resolved, however they did concern him given the level of exhaustion and consistency with prior angina.  He has been without exertional symptoms.  He does note bilateral lower extremity  ankle swelling if he is up on his feet on a hard floor for prolonged time frames.  He also notes intermittent palpitations and a history of irregular heartbeat.  Sometimes skips losartan/HCTZ if it is later in the day so he is not up avoiding during the nighttime hours.  Continues to note back pain.  Adherent to CPAP, has new machine.  Feels well  today and is currently without symptoms of angina or cardiac decompensation.   Labs independently reviewed: 04/2021 - direct LDL 59, TC 112, TG 147, HDL 30, albumin 4.6, AST/ALT normal 02/2021 - Hgb 15.9, PLT 136, BUN 9, serum creatinine 1.06, potassium 3.9  Past Medical History:  Diagnosis Date   Anxiety    CAD (coronary artery disease), native coronary artery 06/04/2011   s/p MI with PCI to left circ   Chronic lower back pain    DDD (degenerative disc disease), lumbar    Depression    High cholesterol    History of kidney stones    Hypertension    Irregular heart beats    MI (myocardial infarction) (HCC) 05/2011   OSA on CPAP     Past Surgical History:  Procedure Laterality Date   COLONOSCOPY     CORONARY ANGIOPLASTY WITH STENT PLACEMENT  05/2011   "1"   CORONARY PRESSURE/FFR WITH 3D MAPPING N/A 02/12/2021   Procedure: Coronary Pressure Wire/FFR w/3D Mapping;  Surgeon: Iran Ouch, MD;  Location: [facility] INVASIVE CV LAB;  Service: Cardiovascular;  Laterality: N/A;   CORONARY STENT INTERVENTION N/A 02/12/2021   Procedure: CORONARY STENT INTERVENTION;  Surgeon: Iran Ouch, MD;  Location: [facility] INVASIVE CV [facility];  Service: Cardiovascular;  Laterality: N/A;   CYST EXCISION Right ~ 2009   "index finger; in office"   CYSTOSCOPY W/ STONE MANIPULATION  ~ 1975 X 2   LEFT HEART CATH AND CORONARY ANGIOGRAPHY Left 02/12/2021   Procedure: LEFT HEART CATH AND CORONARY ANGIOGRAPHY;  Surgeon: Iran Ouch, MD;  Location: [facility] INVASIVE CV [facility];  Service: Cardiovascular;  Laterality: Left;   LEFT HEART CATHETERIZATION WITH CORONARY ANGIOGRAM N/A 06/04/2011   Procedure: LEFT HEART CATHETERIZATION WITH CORONARY ANGIOGRAM;  Surgeon: Othella Boyer, MD;  Location: [facility] CATH LAB;  Service: Cardiovascular;  Laterality: N/A;   LEFT HEART CATHETERIZATION WITH CORONARY ANGIOGRAM N/A 11/18/2013   Procedure: LEFT HEART CATHETERIZATION WITH CORONARY ANGIOGRAM;  Surgeon: Lesleigh Noe, MD;   Location: [facility] CATH LAB;  Service: Cardiovascular;  Laterality: N/A;   PERCUTANEOUS CORONARY STENT INTERVENTION (PCI-S)  11/18/2013   Procedure: PERCUTANEOUS CORONARY STENT INTERVENTION (PCI-S);  Surgeon: Lesleigh Noe, MD;  Location: [facility] CATH LAB;  Service: Cardiovascular;;   SHOULDER ARTHROSCOPY WITH OPEN ROTATOR CUFF REPAIR Right 11/24/2018   Procedure: SHOULDER ARTHROSCOPY WITH OPEN MINI ROTATOR CUFF REPAIR, SUBACROMIAL DECOMPRESSION, DISTAL CLAVICLE EXCISION, BICEPS TENODESIS;  Surgeon: Juanell Fairly, MD;  Location: [facility] ORS;  Service: Orthopedics;  Laterality: Right;    Current Medications: Current Meds  Medication Sig   amLODipine (NORVASC) 10 MG tablet Take 1 tablet (10 mg total) by mouth daily.   aspirin EC 81 MG tablet Take 81 mg by mouth daily.   atorvastatin (LIPITOR) 80 MG tablet TAKE ONE TABLET BY MOUTH EVERY EVENING   clopidogrel (PLAVIX) 75 MG tablet TAKE ONE TABLET BY MOUTH ONE TIME DAILY   diclofenac sodium (VOLTAREN) 1 % GEL Apply 1 application topically 3 (three) times daily. Apply to feet   ezetimibe (ZETIA) 10 MG tablet Take 1 tablet (10 mg total) by  mouth daily.   hydrOXYzine (VISTARIL) 25 MG capsule Take 50 mg by mouth every 8 (eight) hours as needed.   losartan-hydrochlorothiazide (HYZAAR) 100-25 MG tablet Take 1 tablet by mouth daily.   Oxycodone HCl 10 MG TABS Take 10 mg by mouth in the morning, at noon, in the evening, and at bedtime. Break pill in half, takes PRN through out day   polycarbophil (FIBERCON) 625 MG tablet Take 1,250 mg by mouth daily.   sertraline (ZOLOFT) 25 MG tablet Take 75 mg by mouth daily.   silodosin (RAPAFLO) 8 MG CAPS capsule Take 1 capsule (8 mg total) by mouth daily with breakfast.   zolpidem (AMBIEN) 10 MG tablet Take 10 mg by mouth at bedtime.    Allergies:   Patient has no known allergies.   Social History   Socioeconomic History   Marital status: Married    Spouse name: Not on file   Number of children: Not on file   Years  of education: Not on file   Highest education level: Not on file  Occupational History   Not on file  Tobacco Use   Smoking status: Never    Passive exposure: Never   Smokeless tobacco: Never  Vaping Use   Vaping status: Never Used  Substance and Sexual Activity   Alcohol use: Not Currently    Alcohol/week: 2.0 standard drinks of alcohol    Types: 2 Glasses of wine per week   Drug use: No   Sexual activity: Yes  Other Topics Concern   Not on file  Social History Narrative   Not on file   Social Determinants of Health   Financial Resource Strain: Not on file  Food Insecurity: Not on file  Transportation Needs: Not on file  Physical Activity: Not on file  Stress: Not on file  Social Connections: Not on file     Family History:  The patient's family history includes Coronary artery disease (age of onset: 33) in his father; Coronary artery disease (age of onset: 76) in his brother; Kidney cancer in his brother; Lupus in his daughter; Non-Hodgkin's lymphoma in his mother.  ROS:   12-point review of systems is negative unless otherwise noted in the HPI.   EKGs/Labs/Other Studies Reviewed:    Studies reviewed were summarized above. The additional studies were reviewed today:  Renal artery ultrasound 04/05/2021: Right: Normal size right kidney. Normal right Resisitive Index.         Normal cortical thickness of right kidney. No evidence of         right renal artery stenosis. RRV flow present.  Left:  Normal size of left kidney. Normal left Resistive Index.         Normal cortical thickness of the left kidney. No evidence of         left renal artery stenosis. LRV flow present.  Mesenteric:  Normal Celiac artery and Superior Mesenteric artery findings.  __________  LHC 02/12/2021:   Prox RCA to Mid RCA lesion is 100% stenosed.   Ost RCA to Prox RCA lesion is 50% stenosed.   Mid Cx lesion is 10% stenosed.   Mid Cx to Dist Cx lesion is 50% stenosed.   3rd Mrg lesion is  40% stenosed.   1st Mrg lesion is 90% stenosed.   Prox LAD to Mid LAD lesion is 40% stenosed.   Prox Cx to Mid Cx lesion is 70% stenosed.   Previously placed Mid LAD stent (unknown type) is  widely patent.  A drug-eluting stent was successfully placed using a STENT ONYX FRONTIER 3.0X18.   Post intervention, there is a 0% residual stenosis.   The left ventricular systolic function is normal.   LV end diastolic pressure is normal.   The left ventricular ejection fraction is 55-65% by visual estimate.   1.  Significant underlying three-vessel coronary artery disease with patent stents in the left circumflex and LAD.  Chronically occluded right coronary artery with left-to-right collaterals.  There is 70% stenosis in the proximal/mid left circumflex at the proximal edge of the previously placed stent.  This was significant by fractional flow reserve evaluation with an IFR ratio of 0.85.  There is also moderate disease in the left circumflex distal to the old stent.  This was not significant by fractional flow reserve evaluation after treating the more proximal lesion.  OM1 is small in diameter with severe diffuse disease.  2.  Normal LV systolic function and high normal left ventricular end-diastolic pressure. 3.  Successful drug-eluting stent placement to the proximal/mid left circumflex overlapping the previously placed stent.   Recommendations: Continue dual antiplatelet therapy indefinitely if tolerated given multiple stents. Aggressive treatment of risk factors. __________   2D echo 05/05/2020: 1. Left ventricular ejection fraction, by estimation, is 50 to 55%. The  left ventricle has low normal function. Parasternal short axis images  suggestive of basal inferior wall hypokineis. There is mild left  ventricular hypertrophy. Left ventricular  diastolic parameters are consistent with Grade I diastolic dysfunction  (impaired relaxation).   2. Right ventricular systolic function is normal.  The right ventricular  size is normal.   3. Left atrial size was moderately dilated.   4. The aortic valve is normal in structure.Trileaflet. Aortic valve  regurgitation is not visualized. No aortic stenosis is present.   5. Challenging images.    EKG:  EKG is ordered today.  The EKG ordered today demonstrates NSR, 60 bpm, first-degree AV block, baseline artifact, no acute ST-T changes  Recent Labs: No results found for requested labs within last 365 days.  Recent Lipid Panel    Component Value Date/Time   CHOL 112 04/24/2021 1102   TRIG 147 04/24/2021 1102   HDL 30 (L) 04/24/2021 1102   CHOLHDL 3.7 04/24/2021 1102   CHOLHDL 3.7 11/18/2013 0246   VLDL 38 11/18/2013 0246   LDLCALC 56 04/24/2021 1102   LDLDIRECT 59 04/24/2021 1102    PHYSICAL EXAM:    VS:  BP (!) 158/82 (BP Location: Right Arm, Patient Position: Sitting, Cuff Size: Normal)   Pulse 60   Ht 5\' 6"  (1.676 m)   Wt 202 lb (91.6 kg)   SpO2 96%   BMI 32.60 kg/m   BMI: Body mass index is 32.6 kg/m.  Physical Exam Vitals reviewed.  Constitutional:      Appearance: He is well-developed.  HENT:     Head: Normocephalic and atraumatic.  Eyes:     General:        Right eye: No discharge.        Left eye: No discharge.  Neck:     Vascular: No JVD.  Cardiovascular:     Rate and Rhythm: Normal rate and regular rhythm.     Pulses:          Posterior tibial pulses are 2+ on the right side and 2+ on the left side.     Heart sounds: Normal heart sounds, S1 normal and S2 normal. Heart sounds not distant. No midsystolic click and  no opening snap. No murmur heard.    No friction rub.  Pulmonary:     Effort: Pulmonary effort is normal. No respiratory distress.     Breath sounds: Normal breath sounds. No decreased breath sounds, wheezing or rales.  Chest:     Chest wall: No tenderness.  Abdominal:     General: There is no distension.  Musculoskeletal:     Cervical back: Normal range of motion.  Skin:    General:  Skin is warm and dry.     Nails: There is no clubbing.  Neurological:     Mental Status: He is alert and oriented to person, place, and time.  Psychiatric:        Speech: Speech normal.        Behavior: Behavior normal.        Thought Content: Thought content normal.        Judgment: Judgment normal.     Wt Readings from Last 3 Encounters:  11/25/22 202 lb (91.6 kg)  01/17/22 192 lb (87.1 kg)  12/04/21 196 lb 6.4 oz (89.1 kg)     ASSESSMENT & PLAN:   CAD involving the native coronary arteries with unstable angina: Currently without symptoms of angina or cardiac decompensation.  Given concerning symptoms similar to prior angina we have decided to move forward with diagnostic LHC.  Continue DAPT with ASA and clopidogrel, ideally indefinitely given overlapping stents, along with atorvastatin, ezetimibe, and losartan/HCTZ.  Aggressive risk factor modification.  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  He remains on losartan.  Not on beta-blocker secondary to resting sinus bradycardia.  Not requiring a standing loop diuretic, though is on thiazide diuretic.  Update echo with dyspnea and profound exhaustion concerning for anginal equivalent.  Further recommendations regarding medical therapy pending results.  HTN: Blood pressure is mildly elevated.  He does sometimes skip losartan/HCTZ.  Remains on amlodipine 10 mg daily and losartan/HCTZ 100/25 mg daily.  Pending echo results, and BP trend, escalate medical therapy in follow-up.  HLD: LDL 59 in 04/2021.  Remains on atorvastatin 80 mg and ezetimibe.  Check CMP and lipid panel (fasting).  PVCs: Quiescent.  No longer on beta-blocker given resting heart rates typically in the 50s to 60s bpm.  OSA: Adherent to CPAP.   Informed Consent   Shared Decision Making/Informed Consent{  The risks [stroke (1 in 1000), death (1 in 1000), kidney failure [usually temporary] (1 in 500), bleeding (1 in 200), allergic reaction [possibly  serious] (1 in 200)], benefits (diagnostic support and management of coronary artery disease) and alternatives of a cardiac catheterization were discussed in detail with Mr. Plasencia and he is willing to proceed.       Disposition: F/u with Dr. Kirke Corin or an APP in 2 months.   Medication Adjustments/Labs and Tests Ordered: Current medicines are reviewed at length with the patient today.  Concerns regarding medicines are outlined above. Medication changes, Labs and Tests ordered today are summarized above and listed in the Patient Instructions accessible in Encounters.   Signed, Eula Listen, PA-C 11/25/2022 9:50 AM     [facility] [place] [address] [place], [place] [address] [phone]

## 2022-11-25 ENCOUNTER — Ambulatory Visit: Payer: Medicare Other | Attending: Physician Assistant | Admitting: Physician Assistant

## 2022-11-25 ENCOUNTER — Encounter: Payer: Self-pay | Admitting: Physician Assistant

## 2022-11-25 ENCOUNTER — Ambulatory Visit: Payer: Medicare Other

## 2022-11-25 VITALS — BP 158/82 | HR 60 | Ht 66.0 in | Wt 202.0 lb

## 2022-11-25 DIAGNOSIS — E785 Hyperlipidemia, unspecified: Secondary | ICD-10-CM | POA: Diagnosis not present

## 2022-11-25 DIAGNOSIS — I255 Ischemic cardiomyopathy: Secondary | ICD-10-CM | POA: Diagnosis not present

## 2022-11-25 DIAGNOSIS — R002 Palpitations: Secondary | ICD-10-CM

## 2022-11-25 DIAGNOSIS — G4733 Obstructive sleep apnea (adult) (pediatric): Secondary | ICD-10-CM

## 2022-11-25 DIAGNOSIS — I25110 Atherosclerotic heart disease of native coronary artery with unstable angina pectoris: Secondary | ICD-10-CM | POA: Diagnosis not present

## 2022-11-25 DIAGNOSIS — I2510 Atherosclerotic heart disease of native coronary artery without angina pectoris: Secondary | ICD-10-CM | POA: Diagnosis not present

## 2022-11-25 DIAGNOSIS — I493 Ventricular premature depolarization: Secondary | ICD-10-CM

## 2022-11-25 DIAGNOSIS — I10 Essential (primary) hypertension: Secondary | ICD-10-CM | POA: Diagnosis not present

## 2022-11-25 NOTE — Patient Instructions (Addendum)
Medication Instructions:  Your Physician recommend you continue on your current medication as directed.     *If you need a refill on your cardiac medications before your next appointment, please call your pharmacy*   Lab Work: Your provider would like for you to have following labs drawn today CMET, LIPID PANEL, CBC.    If you have labs (blood work) drawn today and your tests are completely normal, you will receive your results only by: MyChart Message (if you have MyChart) OR A paper copy in the mail If you have any lab test that is abnormal or we need to change your treatment, we will call you to review the results.   Testing/Procedures: Your physician has requested that you have an echocardiogram. Echocardiography is a painless test that uses sound waves to create images of your heart. It provides your doctor with information about the size and shape of your heart and how well your heart's chambers and valves are working.   You may receive an ultrasound enhancing agent through an IV if needed to better visualize your heart during the echo. This procedure takes approximately one hour.  There are no restrictions for this procedure.  This will take place at [address] [address], [place] [address]  [name]- Long Term Monitor Instructions  Your physician has requested you wear a ZIO patch monitor for 14 days.  This is a single patch monitor. [employer] supplies one patch monitor per enrollment. Additional stickers are not available. Please do not apply patch if you will be having a Nuclear Stress Test,  Echocardiogram, Cardiac CT, MRI, or Chest Xray during the period you would be wearing the  monitor. The patch cannot be worn during these tests. You cannot remove and re-apply the  ZIO XT patch monitor.  Your ZIO patch monitor will be mailed 3 day USPS to your address on file. It may take 3-5 days  to receive your monitor after you have been enrolled.  Once you  have received your monitor, please review the enclosed instructions. Your monitor  has already been registered assigning a specific monitor serial # to you.  Billing and Patient Assistance Program Information  We have supplied [employer] with any of your insurance information on file for billing purposes. [employer] offers a sliding scale Patient Assistance Program for patients that do not have  insurance, or whose insurance does not completely cover the cost of the ZIO monitor.  You must apply for the Patient Assistance Program to qualify for this discounted rate.  To apply, please call [employer] at [phone], select option 4, select option 2, ask to apply for  Patient Assistance Program. Meredeth Ide will ask your household income, and how many people  are in your household. They will quote your out-of-pocket cost based on that information.  [employer] will also be able to set up a 105-month, interest-free payment plan if needed.  Applying the monitor   Shave hair from upper left chest.  Hold abrader disc by orange tab. Rub abrader in 40 strokes over the upper left chest as  indicated in your monitor instructions.  Clean area with 4 enclosed alcohol pads. Let dry.  Apply patch as indicated in monitor instructions. Patch will be placed under collarbone on left  side of chest with arrow pointing upward.  Rub patch adhesive wings for 2 minutes. Remove white label marked "1". Remove the white  label marked "2". Rub patch adhesive wings for 2 additional minutes.  While looking  in a mirror, press and release button in center of patch. A small green light will  flash 3-4 times. This will be your only indicator that the monitor has been turned on.  Do not shower for the first 24 hours. You may shower after the first 24 hours.  Press the button if you feel a symptom. You will hear a small click. Record Date, Time and  Symptom in the Patient Logbook.  When you are ready to remove the patch, follow  instructions on the last 2 pages of Patient  Logbook. Stick patch monitor onto the last page of Patient Logbook.  Place Patient Logbook in the blue and white box. Use locking tab on box and tape box closed  securely. The blue and white box has prepaid postage on it. Please place it in the mailbox as  soon as possible. Your physician should have your test results approximately 7 days after the  monitor has been mailed back to [facility].  Call [facility] Customer Care at [phone] if you have questions regarding  your ZIO XT patch monitor. Call them immediately if you see an orange light blinking on your  monitor.  If your monitor falls off in less than 4 days, contact our Monitor department at [phone].  If your monitor becomes loose or falls off after 4 days call [employer] at [phone] for  suggestions on securing your monitor    Follow-Up: At [facility], you and your health needs are our priority.  As part of our continuing mission to provide you with exceptional heart care, we have created designated Provider Care Teams.  These Care Teams include your primary Cardiologist (physician) and Advanced Practice Providers (APPs -  Physician Assistants and Nurse Practitioners) who all work together to provide you with the care you need, when you need it.  We recommend signing up for the patient portal called "MyChart".  Sign up information is provided on this After Visit Summary.  MyChart is used to connect with patients for Virtual Visits (Telemedicine).  Patients are able to view lab/test results, encounter notes, upcoming appointments, etc.  Non-urgent messages can be sent to your provider as well.   To learn more about what you can do with MyChart, go to [email or web address].    Your next appointment:   2 month(s) after ZIO monitor is complete  Provider:   You may see Lorine Bears, MD or one of the following Advanced Practice Providers on your designated Care  Team:    Eula Listen, PA-C   Other Instructions  [facility] A DEPT OF [facility] AT [facility] [address] [place], [address] [place] [address] Dept: [phone] Loc: [phone]  Terry Cook  11/25/2022  You are scheduled for a Cardiac Catheterization on Monday, August 26 with Dr. Lorine Bears.  1. Please arrive at the [facility] Entrance of [facility], [address], [place] [address] at 8:30 AM (This is 1 hour prior to your procedure time).  Proceed to the Check-In Desk directly inside the entrance.  Procedure Parking: Use the entrance off of the [address] side of the hospital. Turn right upon entering and follow the driveway to parking that is directly in front of the Heart & Vascular Center. There is no valet parking available at this entrance, however there is an awning directly in front of the Heart & Vascular Center for drop off/ pick up for patients.  Special note:  Every effort is made to have your procedure done on time. Please understand that emergencies sometimes delay scheduled procedures.  2. Diet: Do not eat solid foods after midnight.  The patient may have clear liquids until 5am upon the day of the procedure.  3. Labs: You had labs drawn today.  4. Medication instructions in preparation for your procedure are NONE to hold   Contrast Allergy: NONE  On the morning of your procedure, take your Aspirin 81 mg and Plavix/Clopidogrel.  You may use sips of water.  5. Plan to go home the same day, you will only stay overnight if medically necessary. 6. Bring a current list of your medications and current insurance cards. 7. You MUST have a responsible person to drive you home. 8. Someone MUST be with you the first 24 hours after you arrive home or your discharge will be delayed. 9. Please wear clothes that are easy to get on and off and wear slip-on shoes.  Thank you for allowing [place]  to care for you!   -- [facility]   .hcan

## 2022-11-26 ENCOUNTER — Other Ambulatory Visit: Payer: Self-pay | Admitting: Emergency Medicine

## 2022-11-26 ENCOUNTER — Encounter: Payer: Self-pay | Admitting: Emergency Medicine

## 2022-11-26 ENCOUNTER — Telehealth: Payer: Self-pay | Admitting: Physician Assistant

## 2022-11-26 ENCOUNTER — Telehealth: Payer: Self-pay | Admitting: *Deleted

## 2022-11-26 DIAGNOSIS — E876 Hypokalemia: Secondary | ICD-10-CM

## 2022-11-26 LAB — CBC
Hematocrit: 43.3 % (ref 37.5–51.0)
Hemoglobin: 15.1 g/dL (ref 13.0–17.7)
MCH: 31.1 pg (ref 26.6–33.0)
MCHC: 34.9 g/dL (ref 31.5–35.7)
MCV: 89 fL (ref 79–97)
Platelets: 134 10*3/uL — ABNORMAL LOW (ref 150–450)
RBC: 4.85 x10E6/uL (ref 4.14–5.80)
RDW: 13.9 % (ref 11.6–15.4)
WBC: 8.2 10*3/uL (ref 3.4–10.8)

## 2022-11-26 LAB — COMPREHENSIVE METABOLIC PANEL
ALT: 24 IU/L (ref 0–44)
AST: 28 IU/L (ref 0–40)
Albumin: 4.3 g/dL (ref 3.8–4.8)
Alkaline Phosphatase: 123 IU/L — ABNORMAL HIGH (ref 44–121)
BUN/Creatinine Ratio: 12 (ref 10–24)
BUN: 12 mg/dL (ref 8–27)
Bilirubin Total: 0.5 mg/dL (ref 0.0–1.2)
CO2: 29 mmol/L (ref 20–29)
Calcium: 9 mg/dL (ref 8.6–10.2)
Chloride: 98 mmol/L (ref 96–106)
Creatinine, Ser: 0.99 mg/dL (ref 0.76–1.27)
Globulin, Total: 2.9 g/dL (ref 1.5–4.5)
Glucose: 91 mg/dL (ref 70–99)
Potassium: 3.2 mmol/L — ABNORMAL LOW (ref 3.5–5.2)
Sodium: 143 mmol/L (ref 134–144)
Total Protein: 7.2 g/dL (ref 6.0–8.5)
eGFR: 81 mL/min/{1.73_m2} (ref >59)

## 2022-11-26 LAB — LIPID PANEL
Chol/HDL Ratio: 3.5 ratio (ref 0.0–5.0)
Cholesterol, Total: 110 mg/dL (ref 100–199)
HDL: 31 mg/dL — ABNORMAL LOW (ref >39)
LDL Chol Calc (NIH): 56 mg/dL (ref 0–99)
Triglycerides: 127 mg/dL (ref 0–149)
VLDL Cholesterol Cal: 23 mg/dL (ref 5–40)

## 2022-11-26 MED ORDER — POTASSIUM CHLORIDE ER 10 MEQ PO TBCR
10.0000 meq | EXTENDED_RELEASE_TABLET | Freq: Every day | ORAL | 3 refills | Status: DC
Start: 2022-11-26 — End: 2023-12-09

## 2022-11-26 NOTE — Telephone Encounter (Signed)
Advised patient that his cath has been rescheduled for 8/23 and he needs to arrive at 10:30 for a 11:30 start time. Patient verbalized understanding and my chart sent as well.

## 2022-11-26 NOTE — Progress Notes (Addendum)
Opened in error

## 2022-11-26 NOTE — Telephone Encounter (Signed)
Spoke with patient and reviewed his instructions and scheduled appointment for his post cath follow up. He confirmed and had no further questions at this time.

## 2022-11-26 NOTE — Telephone Encounter (Signed)
Pt would like to reschedule his heart cath this Friday on 11/29/22 if its still available

## 2022-11-26 NOTE — Telephone Encounter (Signed)
-----   Message from Eula Listen sent at 11/26/2022  1:00 PM EDT ----- If possible, we should see him back in about 2 weeks post cath. Thanks! ----- Message ----- From: Bryna Colander, RN Sent: 11/26/2022   9:49 AM EDT To: Sondra Barges, PA-C  This patient is scheduled for left heart cath on 8/26. His follow up appointment is 10/18 at 8 am. Do you want him to be seen sooner since he is post cath?

## 2022-11-29 ENCOUNTER — Ambulatory Visit
Admission: RE | Admit: 2022-11-29 | Discharge: 2022-11-29 | Disposition: A | Discharge status: Home / Self Care | Payer: Medicare Other | Attending: Cardiovascular Disease | Admitting: Cardiovascular Disease

## 2022-11-29 ENCOUNTER — Encounter
Admission: RE | Disposition: A | Discharge status: Home / Self Care | Payer: Self-pay | Source: Home / Self Care | Attending: Cardiovascular Disease

## 2022-11-29 ENCOUNTER — Encounter: Payer: Self-pay | Admitting: Cardiovascular Disease

## 2022-11-29 ENCOUNTER — Other Ambulatory Visit: Payer: Self-pay

## 2022-11-29 DIAGNOSIS — I493 Ventricular premature depolarization: Secondary | ICD-10-CM | POA: Diagnosis not present

## 2022-11-29 DIAGNOSIS — I200 Unstable angina: Secondary | ICD-10-CM

## 2022-11-29 DIAGNOSIS — E7800 Pure hypercholesterolemia, unspecified: Secondary | ICD-10-CM | POA: Insufficient documentation

## 2022-11-29 DIAGNOSIS — I25118 Atherosclerotic heart disease of native coronary artery with other forms of angina pectoris: Secondary | ICD-10-CM | POA: Diagnosis not present

## 2022-11-29 DIAGNOSIS — Z955 Presence of coronary angioplasty implant and graft: Secondary | ICD-10-CM | POA: Diagnosis not present

## 2022-11-29 DIAGNOSIS — Z8249 Family history of ischemic heart disease and other diseases of the circulatory system: Secondary | ICD-10-CM | POA: Diagnosis not present

## 2022-11-29 DIAGNOSIS — G4733 Obstructive sleep apnea (adult) (pediatric): Secondary | ICD-10-CM | POA: Insufficient documentation

## 2022-11-29 DIAGNOSIS — Z7902 Long term (current) use of antithrombotics/antiplatelets: Secondary | ICD-10-CM | POA: Insufficient documentation

## 2022-11-29 DIAGNOSIS — Z79899 Other long term (current) drug therapy: Secondary | ICD-10-CM | POA: Insufficient documentation

## 2022-11-29 DIAGNOSIS — I25110 Atherosclerotic heart disease of native coronary artery with unstable angina pectoris: Secondary | ICD-10-CM | POA: Diagnosis not present

## 2022-11-29 DIAGNOSIS — I2582 Chronic total occlusion of coronary artery: Secondary | ICD-10-CM | POA: Insufficient documentation

## 2022-11-29 DIAGNOSIS — I10 Essential (primary) hypertension: Secondary | ICD-10-CM | POA: Diagnosis not present

## 2022-11-29 DIAGNOSIS — I252 Old myocardial infarction: Secondary | ICD-10-CM | POA: Diagnosis not present

## 2022-11-29 HISTORY — PX: LEFT HEART CATH AND CORONARY ANGIOGRAPHY: CATH118249

## 2022-11-29 SURGERY — LEFT HEART CATH AND CORONARY ANGIOGRAPHY
Anesthesia: Moderate Sedation | Laterality: Left

## 2022-11-29 MED ORDER — MIDAZOLAM HCL 2 MG/2ML IJ SOLN
INTRAMUSCULAR | Status: AC
  Filled 2022-11-29: qty 2

## 2022-11-29 MED ORDER — FENTANYL CITRATE (PF) 100 MCG/2ML IJ SOLN
INTRAMUSCULAR | Status: DC | PRN
  Administered 2022-11-29: 25 ug via INTRAVENOUS

## 2022-11-29 MED ORDER — VERAPAMIL HCL 2.5 MG/ML IV SOLN
INTRAVENOUS | Status: AC
  Filled 2022-11-29: qty 2

## 2022-11-29 MED ORDER — MIDAZOLAM HCL 2 MG/2ML IJ SOLN
INTRAMUSCULAR | Status: DC | PRN
  Administered 2022-11-29: 1 mg via INTRAVENOUS

## 2022-11-29 MED ORDER — SODIUM CHLORIDE 0.9 % IV SOLN: 250.0000 mL | INTRAVENOUS | Status: DC | PRN

## 2022-11-29 MED ORDER — SODIUM CHLORIDE 0.9 % WEIGHT BASED INFUSION: 1.0000 mL/kg/h | INTRAVENOUS | Status: DC

## 2022-11-29 MED ORDER — LIDOCAINE HCL (PF) 1 % IJ SOLN
INTRAMUSCULAR | Status: DC | PRN
  Administered 2022-11-29: 2 mL via SUBCUTANEOUS

## 2022-11-29 MED ORDER — LIDOCAINE HCL 1 % IJ SOLN
INTRAMUSCULAR | Status: AC
  Filled 2022-11-29: qty 20

## 2022-11-29 MED ORDER — ONDANSETRON HCL 4 MG/2ML IJ SOLN: 4.0000 mg | Freq: Four times a day (QID) | INTRAMUSCULAR | Status: DC | PRN

## 2022-11-29 MED ORDER — HEPARIN (PORCINE) IN NACL 2000-0.9 UNIT/L-% IV SOLN
INTRAVENOUS | Status: DC | PRN
  Administered 2022-11-29: 1000 mL

## 2022-11-29 MED ORDER — SODIUM CHLORIDE 0.9 % IV SOLN: INTRAVENOUS | Status: DC

## 2022-11-29 MED ORDER — ASPIRIN 81 MG PO CHEW: 81.0000 mg | CHEWABLE_TABLET | ORAL | Status: DC

## 2022-11-29 MED ORDER — HEPARIN SODIUM (PORCINE) 1000 UNIT/ML IJ SOLN
INTRAMUSCULAR | Status: AC
  Filled 2022-11-29: qty 10

## 2022-11-29 MED ORDER — SODIUM CHLORIDE 0.9% FLUSH: 3.0000 mL | INTRAVENOUS | Status: DC | PRN

## 2022-11-29 MED ORDER — HEPARIN (PORCINE) IN NACL 1000-0.9 UT/500ML-% IV SOLN
INTRAVENOUS | Status: AC
  Filled 2022-11-29: qty 1000

## 2022-11-29 MED ORDER — SODIUM CHLORIDE 0.9 % WEIGHT BASED INFUSION
3.0000 mL/kg/h | INTRAVENOUS | Status: AC
  Administered 2022-11-29: 3 mL/kg/h via INTRAVENOUS

## 2022-11-29 MED ORDER — SODIUM CHLORIDE 0.9% FLUSH: 3.0000 mL | Freq: Two times a day (BID) | INTRAVENOUS | Status: DC

## 2022-11-29 MED ORDER — VERAPAMIL HCL 2.5 MG/ML IV SOLN
INTRAVENOUS | Status: DC | PRN
  Administered 2022-11-29: 2.5 mg via INTRA_ARTERIAL

## 2022-11-29 MED ORDER — ACETAMINOPHEN 325 MG PO TABS: 650.0000 mg | ORAL_TABLET | ORAL | Status: DC | PRN

## 2022-11-29 MED ORDER — IOHEXOL 300 MG/ML  SOLN
INTRAMUSCULAR | Status: DC | PRN
  Administered 2022-11-29: 41 mL

## 2022-11-29 MED ORDER — HEPARIN SODIUM (PORCINE) 1000 UNIT/ML IJ SOLN
INTRAMUSCULAR | Status: DC | PRN
  Administered 2022-11-29: 5000 [IU] via INTRAVENOUS

## 2022-11-29 MED ORDER — FENTANYL CITRATE (PF) 100 MCG/2ML IJ SOLN
INTRAMUSCULAR | Status: AC
  Filled 2022-11-29: qty 2

## 2022-11-29 SURGICAL SUPPLY — 10 items
CATH INFINITI 5FR JK (CATHETERS) IMPLANT
DEVICE RAD TR BAND REGULAR (VASCULAR PRODUCTS) IMPLANT
DRAPE BRACHIAL (DRAPES) IMPLANT
GLIDESHEATH SLEND SS 6F .021 (SHEATH) IMPLANT
GUIDEWIRE INQWIRE 1.5J.035X260 (WIRE) IMPLANT
INQWIRE 1.5J .035X260CM (WIRE) ×1
PACK CARDIAC CATH (CUSTOM PROCEDURE TRAY) ×1 IMPLANT
PROTECTION STATION PRESSURIZED (MISCELLANEOUS) ×1
SET ATX-X65L (MISCELLANEOUS) IMPLANT
STATION PROTECTION PRESSURIZED (MISCELLANEOUS) IMPLANT

## 2022-11-29 NOTE — Interval H&P Note (Signed)
History and Physical Interval Note:  11/29/2022 12:12 PM  Terry Cook  has presented today for surgery, with the diagnosis of L Cath   Unstable angina.  The various methods of treatment have been discussed with the patient and family. After consideration of risks, benefits and other options for treatment, the patient has consented to  Procedure(s): LEFT HEART CATH AND CORONARY ANGIOGRAPHY (Left) as a surgical intervention.  The patient's history has been reviewed, patient examined, no change in status, stable for surgery.  I have reviewed the patient's chart and labs.  Questions were answered to the patient's satisfaction.     Lorine Bears

## 2022-12-02 ENCOUNTER — Encounter: Payer: Self-pay | Admitting: Cardiovascular Disease

## 2022-12-02 DIAGNOSIS — I200 Unstable angina: Secondary | ICD-10-CM

## 2022-12-11 ENCOUNTER — Telehealth: Payer: Self-pay | Admitting: Emergency Medicine

## 2022-12-11 ENCOUNTER — Other Ambulatory Visit: Payer: Self-pay | Admitting: Emergency Medicine

## 2022-12-11 DIAGNOSIS — I10 Essential (primary) hypertension: Secondary | ICD-10-CM | POA: Diagnosis not present

## 2022-12-11 DIAGNOSIS — G4733 Obstructive sleep apnea (adult) (pediatric): Secondary | ICD-10-CM | POA: Diagnosis not present

## 2022-12-11 NOTE — Telephone Encounter (Signed)
Just spoke with pt and he has questions:  Alycia Rossetti answered  1- Pt did not open the Zio monitor since he had a heart cath, does Alycia Rossetti want him to do that then reschedule office appt? Put on the monitor and we'll reschedule the office visit for after that Zio results have returned  2- do labs need to be rescheduled? No, since we're evaluating the effect of the potassium med change.  We can call pt about the lab results and adjust meds if needed.  And fasting? No  We will see pt on 10/18 at 0800

## 2022-12-11 NOTE — Telephone Encounter (Signed)
Called and msg left for pt and wife

## 2022-12-13 ENCOUNTER — Ambulatory Visit: Payer: Medicare Other | Admitting: Physician Assistant

## 2022-12-16 ENCOUNTER — Other Ambulatory Visit: Payer: Self-pay

## 2022-12-16 DIAGNOSIS — E876 Hypokalemia: Secondary | ICD-10-CM | POA: Diagnosis not present

## 2022-12-17 ENCOUNTER — Telehealth: Payer: Self-pay | Admitting: Emergency Medicine

## 2022-12-17 LAB — BASIC METABOLIC PANEL
BUN/Creatinine Ratio: 14 (ref 10–24)
BUN: 14 mg/dL (ref 8–27)
CO2: 28 mmol/L (ref 20–29)
Calcium: 9.3 mg/dL (ref 8.6–10.2)
Chloride: 100 mmol/L (ref 96–106)
Creatinine, Ser: 1.01 mg/dL (ref 0.76–1.27)
Glucose: 103 mg/dL — ABNORMAL HIGH (ref 70–99)
Potassium: 3.7 mmol/L (ref 3.5–5.2)
Sodium: 142 mmol/L (ref 134–144)
eGFR: 79 mL/min/{1.73_m2} (ref >59)

## 2022-12-17 NOTE — Telephone Encounter (Signed)
Call to pt today to remind of labs 1 week overdue  Per DPR: msg left for pt to come in for labs and to call this RN if pt has any concerns

## 2022-12-23 ENCOUNTER — Other Ambulatory Visit: Payer: Medicare Other

## 2022-12-23 DIAGNOSIS — Z6831 Body mass index (BMI) 31.0-31.9, adult: Secondary | ICD-10-CM | POA: Diagnosis not present

## 2022-12-23 DIAGNOSIS — F1120 Opioid dependence, uncomplicated: Secondary | ICD-10-CM | POA: Diagnosis not present

## 2022-12-23 DIAGNOSIS — M47816 Spondylosis without myelopathy or radiculopathy, lumbar region: Secondary | ICD-10-CM | POA: Diagnosis not present

## 2022-12-24 ENCOUNTER — Other Ambulatory Visit: Payer: Self-pay

## 2022-12-24 MED ORDER — EZETIMIBE 10 MG PO TABS: 10.0000 mg | ORAL_TABLET | Freq: Every day | ORAL | 2 refills | Status: DC

## 2022-12-31 ENCOUNTER — Ambulatory Visit: Payer: Medicare Other | Attending: Physician Assistant

## 2022-12-31 DIAGNOSIS — I255 Ischemic cardiomyopathy: Secondary | ICD-10-CM

## 2022-12-31 LAB — ECHOCARDIOGRAM COMPLETE
Area-P 1/2: 3.65 cm2
S' Lateral: 3.8 cm

## 2022-12-31 MED ORDER — PERFLUTREN LIPID MICROSPHERE
1.0000 mL | INTRAVENOUS | Status: AC | PRN
Start: 2022-12-31 — End: 2022-12-31
  Administered 2022-12-31: 3 mL via INTRAVENOUS

## 2023-01-08 DIAGNOSIS — E7800 Pure hypercholesterolemia, unspecified: Secondary | ICD-10-CM | POA: Diagnosis not present

## 2023-01-08 DIAGNOSIS — D696 Thrombocytopenia, unspecified: Secondary | ICD-10-CM | POA: Diagnosis not present

## 2023-01-08 DIAGNOSIS — I10 Essential (primary) hypertension: Secondary | ICD-10-CM | POA: Diagnosis not present

## 2023-01-08 DIAGNOSIS — I700 Atherosclerosis of aorta: Secondary | ICD-10-CM | POA: Diagnosis not present

## 2023-01-10 DIAGNOSIS — I2510 Atherosclerotic heart disease of native coronary artery without angina pectoris: Secondary | ICD-10-CM | POA: Diagnosis not present

## 2023-01-10 DIAGNOSIS — G4733 Obstructive sleep apnea (adult) (pediatric): Secondary | ICD-10-CM | POA: Diagnosis not present

## 2023-01-10 DIAGNOSIS — D696 Thrombocytopenia, unspecified: Secondary | ICD-10-CM | POA: Diagnosis not present

## 2023-01-10 DIAGNOSIS — E7800 Pure hypercholesterolemia, unspecified: Secondary | ICD-10-CM | POA: Diagnosis not present

## 2023-01-10 DIAGNOSIS — I10 Essential (primary) hypertension: Secondary | ICD-10-CM | POA: Diagnosis not present

## 2023-01-23 ENCOUNTER — Encounter: Payer: Self-pay | Admitting: Urology

## 2023-01-23 ENCOUNTER — Ambulatory Visit: Payer: Medicare Other | Admitting: Urology

## 2023-01-23 VITALS — BP 145/78 | HR 74 | Ht 66.0 in | Wt 195.0 lb

## 2023-01-23 DIAGNOSIS — N401 Enlarged prostate with lower urinary tract symptoms: Secondary | ICD-10-CM

## 2023-01-23 DIAGNOSIS — R3912 Poor urinary stream: Secondary | ICD-10-CM

## 2023-01-23 DIAGNOSIS — N138 Other obstructive and reflux uropathy: Secondary | ICD-10-CM

## 2023-01-23 LAB — BLADDER SCAN AMB NON-IMAGING

## 2023-01-23 MED ORDER — SILODOSIN 8 MG PO CAPS
8.0000 mg | ORAL_CAPSULE | Freq: Every day | ORAL | 3 refills | Status: AC
Start: 2023-01-23 — End: ?

## 2023-01-23 NOTE — Progress Notes (Signed)
01/23/2023 12:19 PM   Terry Cook 1950/08/03 [id number]  Reason for visit: Follow up BPH  HPI: 72 year old male previously followed by Dr. Mena Goes for long-term BPH.  He currently is on silodosin 8 mg daily with excellent results.  He really has no complaints about the urination today.  PVR is normal at 0mL.  He has sleep apnea and uses CPAP, and is not getting up at all overnight.  He previously underwent a normal cystoscopy in 2020, as well as a normal renal/bladder ultrasound.  No prostate volume available.  If he misses a dose of the Scilla dose and he does notice that his urinary symptoms worsen.  We again reviewed options like outlet procedures including UroLift or HOLEP, but he would like to continue the silodosin.   PSA previously normal at 2.7.  We reviewed the AUA guidelines that do not recommend routine screening in men over age 68.   Silodosin refilled for BPH, can be filled by PCP moving forward, follow-up with urology as needed   Sondra Come, MD  [facility] [address] [place], [place] [address] [phone]

## 2023-01-24 ENCOUNTER — Ambulatory Visit: Payer: Medicare Other | Admitting: Physician Assistant

## 2023-01-28 ENCOUNTER — Other Ambulatory Visit: Payer: Self-pay | Admitting: Physician Assistant

## 2023-01-28 NOTE — Telephone Encounter (Signed)
*  STAT* If patient is at the pharmacy, call can be transferred to refill team.   1. Which medications need to be refilled? (please list name of each medication and dose if known) Losartan    2. Would you like to learn more about the convenience, safety, & potential cost savings by using the [facility]? mYChart Message   3. Are you open to using the [facility] (Type [facility]. Not sure patient sent mychart message   4. Which pharmacy/location (including street and city if local pharmacy) is medication to be sent to?[facility], [place]   5. Do they need a 30 day or 90 day supply? 90

## 2023-01-29 MED ORDER — LOSARTAN POTASSIUM-HCTZ 100-25 MG PO TABS: 1.0000 | ORAL_TABLET | Freq: Every day | ORAL | 3 refills | Status: DC

## 2023-02-05 DIAGNOSIS — M79672 Pain in left foot: Secondary | ICD-10-CM | POA: Diagnosis not present

## 2023-02-07 DIAGNOSIS — D2371 Other benign neoplasm of skin of right lower limb, including hip: Secondary | ICD-10-CM | POA: Diagnosis not present

## 2023-02-07 DIAGNOSIS — M10072 Idiopathic gout, left ankle and foot: Secondary | ICD-10-CM | POA: Diagnosis not present

## 2023-02-07 DIAGNOSIS — Q667 Congenital pes cavus, unspecified foot: Secondary | ICD-10-CM | POA: Diagnosis not present

## 2023-02-10 DIAGNOSIS — G4733 Obstructive sleep apnea (adult) (pediatric): Secondary | ICD-10-CM | POA: Diagnosis not present

## 2023-02-10 DIAGNOSIS — I10 Essential (primary) hypertension: Secondary | ICD-10-CM | POA: Diagnosis not present

## 2023-02-12 DIAGNOSIS — H02132 Senile ectropion of right lower eyelid: Secondary | ICD-10-CM | POA: Diagnosis not present

## 2023-02-20 DIAGNOSIS — H02132 Senile ectropion of right lower eyelid: Secondary | ICD-10-CM | POA: Diagnosis not present

## 2023-02-21 ENCOUNTER — Ambulatory Visit: Payer: Medicare Other | Admitting: Physician Assistant

## 2023-03-11 DIAGNOSIS — I10 Essential (primary) hypertension: Secondary | ICD-10-CM | POA: Diagnosis not present

## 2023-03-11 DIAGNOSIS — G4733 Obstructive sleep apnea (adult) (pediatric): Secondary | ICD-10-CM | POA: Diagnosis not present

## 2023-03-24 ENCOUNTER — Ambulatory Visit: Payer: Medicare Other | Admitting: Physician Assistant

## 2023-03-24 DIAGNOSIS — F1120 Opioid dependence, uncomplicated: Secondary | ICD-10-CM | POA: Diagnosis not present

## 2023-03-24 DIAGNOSIS — M47816 Spondylosis without myelopathy or radiculopathy, lumbar region: Secondary | ICD-10-CM | POA: Diagnosis not present

## 2023-03-26 DIAGNOSIS — G894 Chronic pain syndrome: Secondary | ICD-10-CM | POA: Diagnosis not present

## 2023-03-26 DIAGNOSIS — M67471 Ganglion, right ankle and foot: Secondary | ICD-10-CM | POA: Diagnosis not present

## 2023-03-26 DIAGNOSIS — M79671 Pain in right foot: Secondary | ICD-10-CM | POA: Diagnosis not present

## 2023-03-27 ENCOUNTER — Telehealth: Payer: Self-pay | Admitting: Cardiovascular Disease

## 2023-03-27 ENCOUNTER — Other Ambulatory Visit: Payer: Self-pay | Admitting: *Deleted

## 2023-03-27 MED ORDER — EZETIMIBE 10 MG PO TABS: 10.0000 mg | ORAL_TABLET | Freq: Every day | ORAL | 0 refills | Status: DC

## 2023-03-27 NOTE — Telephone Encounter (Signed)
   Pre-operative Risk Assessment    Patient Name: Terry Cook  DOB: June 01, 1950 MRN: [id number]      Request for Surgical Clearance    Procedure:   Right fourth toe cyst removal  Date of Surgery:  Clearance TBD                                 Surgeon:  Dr. Excell Seltzer Surgeon's Group or Practice Name:  [facility] Phone number:  [phone] Fax number:  [phone]   Type of Clearance Requested:   - Medical    Type of Anesthesia:  Not Indicated   Additional requests/questions:    SignedMaceo Pro Schools   03/27/2023, 1:17 PM

## 2023-03-27 NOTE — Telephone Encounter (Signed)
   Name: Terry Cook  DOB: 23-Nov-1950  MRN: [id number]  Primary Cardiologist: Lorine Bears, MD  Chart reviewed as part of pre-operative protocol coverage. The patient has an upcoming visit scheduled with Eula Listen, PA on 04/29/2023 at which time clearance can be addressed in case there are any issues that would impact surgical recommendations.   I added preop FYI to appointment note so that provider is aware to address at time of outpatient visit.  Per office protocol the cardiology provider should forward their finalized clearance decision and recommendations regarding antiplatelet therapy to the requesting party below.     I will route this message as FYI to requesting party and remove this message from the preop box as separate preop APP input not needed at this time.   Please call with any questions.  Napoleon Form, Leodis Rains, NP  03/27/2023, 1:24 PM

## 2023-04-01 ENCOUNTER — Encounter: Payer: Self-pay | Admitting: Physician Assistant

## 2023-04-01 NOTE — Progress Notes (Signed)
Patient is needing to undergo right 4th toe ganglion cyst removal with severe neuropathic pain associated. From a cardiac perspective, Terry Cook recently underwent cardiac cath on 11/29/2022 that showed stable coronary anatomy with patent stents in the LAD and LCx along wth chronically occluded RCA with left-to-right collaterals. There was no significant change when compared to his cardiac cath in 02/2021.    Recommendations: -Patient may proceed with noncardiac procedure without further cardiac testing at an overall low risk -Recommend Plavix be held for 5 days prior to surgery -Continue aspirin 81 mg daily throughout the perioperative timeframe as long as the bleeding risk is not deemed to be too great -Resume Plavix postoperatively when it is felt to be safe from a bleeding perspective

## 2023-04-15 ENCOUNTER — Encounter: Payer: Self-pay | Admitting: Podiatry

## 2023-04-15 ENCOUNTER — Other Ambulatory Visit: Payer: Self-pay | Admitting: Podiatry

## 2023-04-16 ENCOUNTER — Encounter: Payer: Self-pay | Admitting: Podiatry

## 2023-04-16 NOTE — Discharge Instructions (Signed)
 [facility]  POST OPERATIVE INSTRUCTIONS FOR DR. ASHLEY AND DR. Sayde Lish [facility] PODIATRY DEPARTMENT   Take your medication as prescribed.  Pain medication should be taken only as needed.  May additionally use Tylenol  or ibuprofen as needed.  Keep the dressing clean, dry and intact.  Keep your foot elevated above the heart level for the first 48 hours.  Continue elevation thereafter to improve swelling.  May apply ice pack to the top of the right foot for maximum 10 minutes out of every 1 hour as needed.  Walking to the bathroom and brief periods of walking are acceptable, unless we have instructed you to be non-weight bearing.  Always wear your post-op shoe when walking.  Always use your crutches if you are to be non-weight bearing.  Do not take a shower. Baths are permissible as long as the foot is kept out of the water.   Every hour you are awake:  Bend your knee 15 times. Flex foot 15 times Massage calf 15 times  Call [facility] [phone]) if any of the following problems occur: You develop a temperature or fever. The bandage becomes saturated with blood. Medication does not stop your pain. Injury of the foot occurs. Any symptoms of infection including redness, odor, or red streaks running from wound.

## 2023-04-16 NOTE — Anesthesia Preprocedure Evaluation (Addendum)
 Anesthesia Evaluation  Patient identified by MRN, date of birth, ID band Patient awake    Reviewed: Allergy & Precautions, H&P , NPO status , Patient's Chart, lab work & pertinent test results  Airway Mallampati: IV  TM Distance: >3 FB Neck ROM: Full    Dental no notable dental hx. (+) Poor Dentition, Missing   Pulmonary neg pulmonary ROS, sleep apnea    Pulmonary exam normal breath sounds clear to auscultation       Cardiovascular hypertension, + angina  + CAD and + Past MI  negative cardio ROS Normal cardiovascular exam Rhythm:Regular Rate:Normal  11-29-22     Prox LAD to Mid LAD lesion is 40% stenosed.   Mid Cx lesion is 10% stenosed.   Mid Cx to Dist Cx lesion is 50% stenosed.   Prox RCA to Mid RCA lesion is 100% stenosed.   Ost RCA to Prox RCA lesion is 50% stenosed.   3rd Mrg lesion is 40% stenosed.   1st Mrg lesion is 90% stenosed.   Dist LAD lesion is 30% stenosed.   Non-stenotic Mid LAD lesion was previously treated.   Non-stenotic Prox Cx to Mid Cx lesion was previously treated.   The left ventricular systolic function is normal.   LV end diastolic pressure is mildly elevated.   The left ventricular ejection fraction is 55-65% by visual estimate.   1.  Significant underlying three-vessel coronary artery disease with patent stents in the LAD and left circumflex with no significant restenosis.  Chronically occluded RCA with left-to-right collaterals.  Dual LAD system. 2.  Normal LV systolic function and mildly elevated left ventricular end-diastolic pressure.      12-31-22 1. Left ventricular ejection fraction, by estimation, is 50 to 55%. The  left ventricle has low normal function. The left ventricle has no regional  wall motion abnormalities. There is mild left ventricular hypertrophy.  Left ventricular diastolic  parameters are consistent with Grade I diastolic dysfunction (impaired  relaxation).    2. Right ventricular systolic function is normal. The right ventricular  size is normal.   3. Left atrial size was mildly dilated.   4. The mitral valve is normal in structure. Mild mitral valve  regurgitation. No evidence of mitral stenosis.   5. The aortic valve is tricuspid. Aortic valve regurgitation is not  visualized. No aortic stenosis is present.   6. The inferior vena cava is normal in size with greater than 50%  respiratory variability, suggesting right atrial pressure of 3 mmHg.  Patient is needing to undergo right 4th toe ganglion cyst removal with severe neuropathic pain associated. From a cardiac perspective, Terry Cook recently underwent cardiac cath on 11/29/2022 that showed stable coronary anatomy with patent stents in the LAD and LCx along wth chronically occluded RCA with left-to-right collaterals. There was no significant change when compared to his cardiac cath in 02/2021.     Recommendations: -Patient may proceed with noncardiac procedure without further cardiac testing at an overall low risk -Recommend Plavix  be held for 5 days prior to surgery -Continue aspirin  81 mg daily throughout the perioperative timeframe as long as the bleeding risk is not deemed to be too great -Resume Plavix  postoperatively when it is felt to be safe from a bleeding perspective    PA note: 04-01-23 Patient is needing to undergo right 4th toe ganglion cyst removal with severe neuropathic pain associated. From a cardiac perspective, Terry Cook recently underwent cardiac cath on 11/29/2022 that showed stable coronary anatomy with patent stents  in the LAD and LCx along wth chronically occluded RCA with left-to-right collaterals. There was no significant change when compared to his cardiac cath in 02/2021.     Recommendations: -Patient may proceed with noncardiac procedure without further cardiac testing at an overall low risk -Recommend Plavix  be held for 5 days prior to surgery -Continue aspirin   81 mg daily throughout the perioperative timeframe as long as the bleeding risk is not deemed to be too great -Resume Plavix  postoperatively when it is felt to be safe from a bleeding perspective       Neuro/Psych  PSYCHIATRIC DISORDERS Anxiety Depression     Neuromuscular disease negative neurological ROS  negative psych ROS   GI/Hepatic negative GI ROS, Neg liver ROS,,,  Endo/Other  negative endocrine ROS    Renal/GU negative Renal ROS  negative genitourinary   Musculoskeletal negative musculoskeletal ROS (+) Arthritis ,    Abdominal   Peds negative pediatric ROS (+)  Hematology negative hematology ROS (+)   Anesthesia Other Findings   Hypertension  High cholesterol CAD (coronary artery disease), native coronary artery  Irregular heart beats MI (myocardial infarction) (HCC)  OSA on CPAP Chronic lower back pain  DDD (degenerative disc disease), lumbar Depression  Anxiety History of kidney stones  Gout History of ischemic cardiomyopathy      Reproductive/Obstetrics negative OB ROS                             Anesthesia Physical Anesthesia Plan  ASA: 3  Anesthesia Plan: General   Post-op Pain Management:    Induction: Intravenous  PONV Risk Score and Plan:   Airway Management Planned: LMA  Additional Equipment:   Intra-op Plan:   Post-operative Plan: Extubation in OR  Informed Consent: I have reviewed the patients History and Physical, chart, labs and discussed the procedure including the risks, benefits and alternatives for the proposed anesthesia with the patient or authorized representative who has indicated his/her understanding and acceptance.     Dental Advisory Given  Plan Discussed with: Anesthesiologist, CRNA and Surgeon  Anesthesia Plan Comments: (Patient consented for risks of anesthesia including but not limited to:  - adverse reactions to medications - damage to eyes, teeth, lips or other oral mucosa -  nerve damage due to positioning  - sore throat or hoarseness - Damage to heart, brain, nerves, lungs, other parts of body or loss of life  Patient voiced understanding and assent.)       Anesthesia Quick Evaluation

## 2023-04-17 ENCOUNTER — Other Ambulatory Visit: Payer: Self-pay

## 2023-04-17 ENCOUNTER — Encounter: Payer: Self-pay | Admitting: Podiatry

## 2023-04-17 ENCOUNTER — Encounter
Admission: RE | Disposition: A | Discharge status: Home / Self Care | Payer: Self-pay | Source: Home / Self Care | Attending: Podiatry

## 2023-04-17 ENCOUNTER — Ambulatory Visit: Payer: Medicare HMO | Admitting: Anesthesiology

## 2023-04-17 ENCOUNTER — Ambulatory Visit
Admission: RE | Admit: 2023-04-17 | Discharge: 2023-04-17 | Disposition: A | Discharge status: Home / Self Care | Payer: Medicare HMO | Attending: Podiatry | Admitting: Podiatry

## 2023-04-17 DIAGNOSIS — Z955 Presence of coronary angioplasty implant and graft: Secondary | ICD-10-CM | POA: Insufficient documentation

## 2023-04-17 DIAGNOSIS — M79671 Pain in right foot: Secondary | ICD-10-CM | POA: Insufficient documentation

## 2023-04-17 DIAGNOSIS — G894 Chronic pain syndrome: Secondary | ICD-10-CM | POA: Diagnosis not present

## 2023-04-17 DIAGNOSIS — M799 Soft tissue disorder, unspecified: Secondary | ICD-10-CM | POA: Diagnosis present

## 2023-04-17 DIAGNOSIS — Z7902 Long term (current) use of antithrombotics/antiplatelets: Secondary | ICD-10-CM | POA: Insufficient documentation

## 2023-04-17 DIAGNOSIS — M67471 Ganglion, right ankle and foot: Secondary | ICD-10-CM | POA: Diagnosis not present

## 2023-04-17 DIAGNOSIS — Z7982 Long term (current) use of aspirin: Secondary | ICD-10-CM | POA: Diagnosis not present

## 2023-04-17 HISTORY — DX: Opioid dependence, uncomplicated: F11.20

## 2023-04-17 HISTORY — DX: Nonrheumatic mitral (valve) insufficiency: I34.0

## 2023-04-17 HISTORY — PX: GANGLION CYST EXCISION: SHX1691

## 2023-04-17 HISTORY — DX: Personal history of other diseases of the circulatory system: Z86.79

## 2023-04-17 HISTORY — DX: Gout, unspecified: M10.9

## 2023-04-17 HISTORY — DX: Other ill-defined heart diseases: I51.89

## 2023-04-17 SURGERY — EXCISION, GANGLION CYST, FOOT
Anesthesia: General | Site: Foot | Laterality: Right

## 2023-04-17 MED ORDER — BUPIVACAINE HCL 0.5 % IJ SOLN
INTRAMUSCULAR | Status: DC | PRN
  Administered 2023-04-17: 10 mL

## 2023-04-17 MED ORDER — BUPIVACAINE LIPOSOME 1.3 % IJ SUSP
INTRAMUSCULAR | Status: AC
  Filled 2023-04-17: qty 10

## 2023-04-17 MED ORDER — MIDAZOLAM HCL 2 MG/2ML IJ SOLN
INTRAMUSCULAR | Status: AC
  Filled 2023-04-17: qty 2

## 2023-04-17 MED ORDER — CEFAZOLIN SODIUM-DEXTROSE 2-3 GM-%(50ML) IV SOLR
INTRAVENOUS | Status: AC
  Filled 2023-04-17: qty 50

## 2023-04-17 MED ORDER — SEVOFLURANE IN SOLN
RESPIRATORY_TRACT | Status: AC
  Filled 2023-04-17: qty 250

## 2023-04-17 MED ORDER — ONDANSETRON HCL 4 MG/2ML IJ SOLN
INTRAMUSCULAR | Status: DC | PRN
  Administered 2023-04-17: 4 mg via INTRAVENOUS

## 2023-04-17 MED ORDER — CLOPIDOGREL BISULFATE 75 MG PO TABS: 75.0000 mg | ORAL_TABLET | Freq: Every day | ORAL | Status: DC

## 2023-04-17 MED ORDER — CEFAZOLIN SODIUM-DEXTROSE 2-4 GM/100ML-% IV SOLN
2.0000 g | INTRAVENOUS | Status: AC
  Administered 2023-04-17: 2 g via INTRAVENOUS

## 2023-04-17 MED ORDER — ONDANSETRON HCL 4 MG/2ML IJ SOLN
INTRAMUSCULAR | Status: AC
  Filled 2023-04-17: qty 2

## 2023-04-17 MED ORDER — FENTANYL CITRATE (PF) 100 MCG/2ML IJ SOLN
INTRAMUSCULAR | Status: AC
  Filled 2023-04-17: qty 2

## 2023-04-17 MED ORDER — SODIUM CHLORIDE 0.9 % IV SOLN: INTRAVENOUS | Status: DC | PRN

## 2023-04-17 MED ORDER — LIDOCAINE HCL (PF) 2 % IJ SOLN
INTRAMUSCULAR | Status: AC
  Filled 2023-04-17: qty 5

## 2023-04-17 MED ORDER — FENTANYL CITRATE (PF) 100 MCG/2ML IJ SOLN
INTRAMUSCULAR | Status: DC | PRN
  Administered 2023-04-17: 50 ug via INTRAVENOUS

## 2023-04-17 MED ORDER — BUPIVACAINE LIPOSOME 1.3 % IJ SUSP
INTRAMUSCULAR | Status: DC | PRN
  Administered 2023-04-17: 5 mL

## 2023-04-17 MED ORDER — 0.9 % SODIUM CHLORIDE (POUR BTL) OPTIME
TOPICAL | Status: DC | PRN
  Administered 2023-04-17: 200 mL

## 2023-04-17 MED ORDER — MIDAZOLAM HCL 5 MG/5ML IJ SOLN
INTRAMUSCULAR | Status: DC | PRN
  Administered 2023-04-17: 2 mg via INTRAVENOUS

## 2023-04-17 MED ORDER — ASPIRIN EC 81 MG PO TBEC: 81.0000 mg | DELAYED_RELEASE_TABLET | Freq: Every day | ORAL | Status: AC

## 2023-04-17 MED ORDER — LIDOCAINE HCL (CARDIAC) PF 100 MG/5ML IV SOSY: PREFILLED_SYRINGE | INTRAVENOUS | Status: DC | PRN

## 2023-04-17 MED ORDER — OXYCODONE HCL 5 MG PO TABS: 5.0000 mg | ORAL_TABLET | Freq: Four times a day (QID) | ORAL | 0 refills | Status: AC | PRN

## 2023-04-17 MED ORDER — PROPOFOL 10 MG/ML IV BOLUS
INTRAVENOUS | Status: DC | PRN
  Administered 2023-04-17: 200 mg via INTRAVENOUS

## 2023-04-17 SURGICAL SUPPLY — 22 items
BENZOIN TINCTURE PRP APPL 2/3 (GAUZE/BANDAGES/DRESSINGS) ×1 IMPLANT
BNDG COHESIVE 4X5 TAN STRL LF (GAUZE/BANDAGES/DRESSINGS) ×1 IMPLANT
BNDG ELASTIC 4X5.8 VLCR NS LF (GAUZE/BANDAGES/DRESSINGS) ×1 IMPLANT
BNDG STRETCH 4X75 STRL LF (GAUZE/BANDAGES/DRESSINGS) ×1 IMPLANT
CANISTER SUCT 1200ML W/VALVE (MISCELLANEOUS) ×1 IMPLANT
COVER LIGHT HANDLE UNIVERSAL (MISCELLANEOUS) ×2 IMPLANT
DURAPREP 26ML APPLICATOR (WOUND CARE) ×1 IMPLANT
ELECT REM PT RETURN 9FT ADLT (ELECTROSURGICAL) ×1
ELECTRODE REM PT RTRN 9FT ADLT (ELECTROSURGICAL) ×1 IMPLANT
GAUZE SPONGE 4X4 12PLY STRL (GAUZE/BANDAGES/DRESSINGS) ×1 IMPLANT
GAUZE XEROFORM 1X8 LF (GAUZE/BANDAGES/DRESSINGS) ×1 IMPLANT
GLOVE BIOGEL PI IND STRL 7.5 (GLOVE) ×1 IMPLANT
GLOVE SURG SS PI 7.0 STRL IVOR (GLOVE) ×1 IMPLANT
GOWN STRL REUS W/ TWL LRG LVL3 (GOWN DISPOSABLE) ×2 IMPLANT
KIT TURNOVER KIT A (KITS) ×1 IMPLANT
NS IRRIG 500ML POUR BTL (IV SOLUTION) ×1 IMPLANT
PACK EXTREMITY ARMC (MISCELLANEOUS) ×1 IMPLANT
SHOE POST OP SQ TOE MED (MISCELLANEOUS) IMPLANT
STOCKINETTE IMPERVIOUS LG (DRAPES) ×1 IMPLANT
STRIP CLOSURE SKIN 1/4X4 (GAUZE/BANDAGES/DRESSINGS) ×1 IMPLANT
SUT ETHILON 3-0 FS-10 30 BLK (SUTURE) ×1
SUTURE EHLN 3-0 FS-10 30 BLK (SUTURE) IMPLANT

## 2023-04-17 NOTE — H&P (Signed)
 HISTORY AND PHYSICAL INTERVAL NOTE:  04/17/2023  7:09 AM  Terry Cook  has presented today for surgery, with the diagnosis of M67.471 - Ganglion cyst of right foot M79.671 - Right foot pain G89.4 - Pain syndrome, chronic.  The various methods of treatment have been discussed with the patient.  No guarantees were given.  After consideration of risks, benefits and other options for treatment, the patient has consented to surgery.  I have reviewed the patients' chart and labs.    PROCEDURE: RIGHT FOOT 4TH TOE SOFT TISSUE MASS/CYST RESECTION  A history and physical examination was performed in my office.  The patient was reexamined.  There have been no changes to this history and physical examination.  Prentice Lee, DPM

## 2023-04-17 NOTE — Anesthesia Procedure Notes (Signed)
 Procedure Name: LMA Insertion Date/Time: 04/17/2023 7:35 AM  Performed by: Jarvis Lew, CRNAPre-anesthesia Checklist: Patient identified, Patient being monitored, Timeout performed, Emergency Drugs available and Suction available Patient Re-evaluated:Patient Re-evaluated prior to induction Oxygen Delivery Method: Circle system utilized Preoxygenation: Pre-oxygenation with 100% oxygen Induction Type: IV induction Ventilation: Mask ventilation without difficulty LMA: LMA inserted LMA Size: 5.0 Tube type: Oral Number of attempts: 1 Placement Confirmation: positive ETCO2 and breath sounds checked- equal and bilateral Tube secured with: Tape Dental Injury: Teeth and Oropharynx as per pre-operative assessment

## 2023-04-17 NOTE — Anesthesia Postprocedure Evaluation (Signed)
 Anesthesia Post Note  Patient: Terry Cook  Procedure(s) Performed: 71909 - EXCISION; GANGLION CYST FOURTH TOE (Right: Foot)  Patient location during evaluation: PACU Anesthesia Type: General Level of consciousness: awake and alert Pain management: pain level controlled Vital Signs Assessment: post-procedure vital signs reviewed and stable Respiratory status: spontaneous breathing, nonlabored ventilation, respiratory function stable and patient connected to nasal cannula oxygen Cardiovascular status: blood pressure returned to baseline and stable Postop Assessment: no apparent nausea or vomiting Anesthetic complications: no   No notable events documented.   Last Vitals:  Vitals:   04/17/23 0805 04/17/23 0830  BP: (!) 145/78 132/85  Pulse: 76 65  Resp: 18 20  Temp: 36.6 C (!) 36.4 C  SpO2: 96% 95%    Last Pain:  Vitals:   04/17/23 0830  TempSrc:   PainSc: 0-No pain                 Spencer Peterkin C Jordynne Mccown

## 2023-04-17 NOTE — Op Note (Signed)
 PODIATRY / FOOT AND ANKLE SURGERY OPERATIVE REPORT    SURGEON: Prentice Lee, DPM  PRE-OPERATIVE DIAGNOSIS:  1.  Right fourth toe soft tissue mass  POST-OPERATIVE DIAGNOSIS: Same  PROCEDURE(S): Excision of right fourth toe soft tissue mass  HEMOSTASIS: Right ankle tourniquet  ANESTHESIA: general  ESTIMATED BLOOD LOSS: 1 cc  FINDING(S): 1.  Solitary soft tissue mass right fourth toe measures approximately 1 cm x 2 cm, tissue appears to be yellow and fibrous.    PATHOLOGY/SPECIMEN(S): Soft tissue mass right fourth toe  INDICATIONS:   Terry Cook is a 73 y.o. male who presents with a painful soft tissue mass to the right fourth toe.  He notes it has been going on now for a number of years but recently has gotten larger and is painful now.  He believes it may be pressing against the nerve and causing pain discomfort.  He does have history of chronic pain and does take oxycodone  10 mg regularly.  He does see pain management specialist.  All treatment options have been discussed with the patient both conservative and surgical attempts at correction include potential risks and complications at this time patient is elected for surgical procedure today consisting of right fourth toe soft tissue mass resection.  No guarantees given.  Consent obtained prior to procedure.  DESCRIPTION: After obtaining full informed written consent, the patient was brought back to the operating room and placed supine upon the operating table.  The patient received IV antibiotics prior to induction.  After obtaining adequate anesthesia, the patient was prepped and draped in the standard fashion.  10 cc of half percent Marcaine  plain was injected about the right fourth ray and fourth toe to block the area of the surgical site.  An Esmarch bandage used to exsanguinate the right lower extremity and the pneumatic ankle tourniquet was inflated.  Attention was directed to the right fourth toe where a semielliptical  incision was made and a 3-1 type of ellipse ellipsing the skin and some of the subcutaneous tissue at the operative site over the cyst.  The skin and some of the underlying subcutaneous tissue was resected and passed off the operative site.  The area immediately underneath the skin appeared to be a bulbous type yellow structure consistent with soft tissue mass.  Circumferential dissection was performed to the area and the soft tissue mass was resected and passed off the operative site.  It did not appear to be adhered to any nerve type structures or to tenderness or joint material but appeared to be free-floating within the subcutaneous layer.  The tissue measured approximately 2 cm x 1 cm and appeared to be yellow and nodular in nature.  The surgical site was explored further but no further soft tissue mass remained or other abnormality.  The surgical site was flushed with copious amounts normal sterile saline.  The skin was then reapproximated well coapted with 3-0 nylon in a combination of simple and horizontal mattress type stitching.  The pneumatic ankle tourniquet was deflated during suturing and a prompt hyperemic response was noted to the right fourth toe and all digits to the right foot.  A postoperative dressing was applied consisting of Xeroform followed by 4 x 4 gauze, gauze roll, Ace wrap.  The patient tolerated the procedure and anesthesia well and was transferred to the recovery with vital signs stable vascular status intact all toes the right foot.  Following a period of postoperative monitoring the patient will be discharged home with the  appropriate orders, instructions, medications.  Patient to follow-up in clinic in 1 week for follow-up.  COMPLICATIONS: None  CONDITION: Good, stable  Prentice Lee, DPM

## 2023-04-17 NOTE — Transfer of Care (Signed)
 Immediate Anesthesia Transfer of Care Note  Patient: Terry Cook  Procedure(s) Performed: 71909 - EXCISION; GANGLION CYST FOURTH TOE (Right: Foot)  Patient Location: PACU  Anesthesia Type:General  Level of Consciousness: awake  Airway & Oxygen Therapy: Patient Spontanous Breathing and Patient connected to nasal cannula oxygen  Post-op Assessment: Report given to RN and Post -op Vital signs reviewed and stable  Post vital signs: Reviewed and stable  Last Vitals:  Vitals Value Taken Time  BP 145/78 04/17/23 0805  Temp 36.6 C 04/17/23 0805  Pulse 76 04/17/23 0805  Resp 18 04/17/23 0805  SpO2 96 % 04/17/23 0805    Last Pain:  Vitals:   04/17/23 0649  TempSrc: Temporal  PainSc: 2       Patients Stated Pain Goal: 0 (04/17/23 0649)  Complications: No notable events documented.

## 2023-04-18 ENCOUNTER — Encounter: Payer: Self-pay | Admitting: Podiatry

## 2023-04-18 LAB — SURGICAL PATHOLOGY

## 2023-04-29 ENCOUNTER — Encounter: Payer: Self-pay | Admitting: Physician Assistant

## 2023-04-29 ENCOUNTER — Ambulatory Visit: Payer: Medicare HMO | Attending: Physician Assistant | Admitting: Physician Assistant

## 2023-04-29 VITALS — BP 158/72 | HR 65 | Ht 66.0 in | Wt 195.6 lb

## 2023-04-29 DIAGNOSIS — I255 Ischemic cardiomyopathy: Secondary | ICD-10-CM | POA: Diagnosis not present

## 2023-04-29 DIAGNOSIS — G4733 Obstructive sleep apnea (adult) (pediatric): Secondary | ICD-10-CM

## 2023-04-29 DIAGNOSIS — E785 Hyperlipidemia, unspecified: Secondary | ICD-10-CM

## 2023-04-29 DIAGNOSIS — I5032 Chronic diastolic (congestive) heart failure: Secondary | ICD-10-CM

## 2023-04-29 DIAGNOSIS — I10 Essential (primary) hypertension: Secondary | ICD-10-CM

## 2023-04-29 DIAGNOSIS — Z01810 Encounter for preprocedural cardiovascular examination: Secondary | ICD-10-CM | POA: Diagnosis not present

## 2023-04-29 DIAGNOSIS — I110 Hypertensive heart disease with heart failure: Secondary | ICD-10-CM

## 2023-04-29 DIAGNOSIS — I2510 Atherosclerotic heart disease of native coronary artery without angina pectoris: Secondary | ICD-10-CM

## 2023-04-29 DIAGNOSIS — I493 Ventricular premature depolarization: Secondary | ICD-10-CM

## 2023-04-29 NOTE — Progress Notes (Signed)
Cardiology Office Note    Date:  04/29/2023   ID:  [facility], DOB March 13, 1951, MRN [id number]  PCP:  Pcp, No  Cardiologist:  Lorine Bears, MD  Electrophysiologist:  None   Chief Complaint: Follow-up  History of Present Illness:   Terry Cook is a 73 y.o. male with history of CAD with prior MI in 2013 status post PCI to the LCx with unstable angina in 2015 s/p PCI to the LAD with PCI to the proximal/mid LCx overlapping the previously placed stent in 02/2021, ICM with subsequent improvement in LV systolic function, strong family history of premature coronary artery disease, PVCs, HTN, HLD, anxiety, BPH, OSA on CPAP, arthritis, and chronic low back pain who presents for follow-up of his CAD and ICM.   He was admitted with an MI in 2013.  He underwent PCI to the LCx and was found to have a chronically occluded RCA with left-to-right collaterals at that time.  EF at that time was 40 to 45%.  He presented with unstable angina in 2015 and was found to have a patent LCx stent, though there was 90% stenosis in the mid LAD which was treated successfully with PCI/DES.  He was seen in the office in 04/2020 and was doing reasonably well.  Over the preceding year, he did report 2 brief episodes of chest discomfort.  There were no exertional symptoms with recommendation to continue medical therapy.  He underwent echo in 04/2020 which showed an improvement in his LV systolic function with a low normal EF of 50 to 55%, possible basal inferior wall hypokinesis, mild LVH, grade 1 diastolic dysfunction, normal RV systolic function and ventricular cavity size, moderately dilated left atrium, and no noted significant valvular abnormalities.  He was seen in the office on 01/29/2021 noting intermittent randomly occurring profound fatigue/exhaustion as well as chest pressure. These symptoms felt similar to his prior angina. Given symptoms, he underwent LHC on 02/12/2021 which demonstrated significant 3-vessel CAD  with patent stents in the LAD and LCx. The RCA was chronically occluded with left-to-right collaterals. There was 70% stenosis in the proximal/mid LCx at the proximal edge of the previously placed stent. This was significant by fractional flow reserve evaluation with an iFR ratio of 0.85. There was also moderate disease in the LCx distal to the old stent. This was not significant by fractional flow reserve evaluation after treating the more proximal lesion. The OM1 was small in diameter with severe diffuse disease. Normal LV systolic function and high normal left ventricular end-diastolic pressure. He underwent successful drug-eluting stent placement to the proximal/mid LCx overlapping the previously placed stent.  Post cath, he felt much better.  Renal artery ultrasound in 03/2021 was negative for RAS bilaterally.   In the setting of of substernal chest pain concerning for progressive angina noted in 11/2022, he underwent LHC in 11/2022 that showed significant underlying three-vessel CAD with patent stents in the LAD and LCx with no significant restenosis.  Chronically occluded RCA with left-to-right collaterals.  Dual LAD system.  No significant change when compared to [facility] in 2022.  Echo in 12/2022 showed an EF of 50 to 55%, no regional wall motion abnormalities, mild LVH, grade 1 diastolic dysfunction, normal RV systolic function and ventricular cavity size, mildly dilated left atrium, mild mitral regurgitation, and an estimated right atrial pressure of 3 mmHg.  He comes in doing well from a cardiac perspective and is without symptoms of angina or cardiac decompensation.  No presyncope or syncope.  He notes an improvement in intermittent randomly occurring episodes of fatigue as well.  He has not yet worn Zio patch, and has it at home.  Functional status continues to be limited by back pain and foot pain.  Reports he will be undergoing repair ectropion of bilateral eyelids.  [facility] Activity Status Index: > 4 METs  with main limiting factor secondary to back pain Revised Cardiac Risk Index:   Labs independently reviewed: 12/2022 - BUN 14, serum creatinine 1.01, potassium 3.7 11/2022 - TC 110, TG 127, HDL 31, LDL 56, albumin 4.3, AST/ALT normal, Hgb 15.1, PLT 134  Past Medical History:  Diagnosis Date   Anxiety    CAD (coronary artery disease), native coronary artery 06/04/2011   s/p MI with PCI to left circ   Chronic lower back pain    DDD (degenerative disc disease), lumbar    Depression    Gout    Grade I diastolic dysfunction    High cholesterol    History of ischemic cardiomyopathy    History of kidney stones    Hypertension    Irregular heart beats    MI (myocardial infarction) (HCC) 05/2011   Mild mitral regurgitation by prior echocardiogram    Opioid dependence (HCC)    OSA on CPAP     Past Surgical History:  Procedure Laterality Date   COLONOSCOPY     CORONARY ANGIOPLASTY WITH STENT PLACEMENT  05/2011   "1"   CORONARY PRESSURE/FFR WITH 3D MAPPING N/A 02/12/2021   Procedure: Coronary Pressure Wire/FFR w/3D Mapping;  Surgeon: Iran Ouch, MD;  Location: [facility] IN[facility] CV [facility];  Service: Cardiovascular;  Laterality: N/A;   CORONARY STENT INTERVENTION N/A 02/12/2021   Procedure: CORONARY STENT INTERVENTION;  Surgeon: Iran Ouch, MD;  Location: [facility] INVASIVE CV [facility];  Service: Cardiovascular;  Laterality: N/A;   CYST EXCISION Right ~ 2009   "index finger; in office"   CYSTOSCOPY W/ STONE MANIPULATION  ~ 1975 X 2   GANGLION CYST EXCISION Right 04/17/2023   Procedure: 28090 - EXCISION; GANGLION CYST FOURTH TOE;  Surgeon: Rosetta Posner, DPM;  Location: [facility];  Service: Orthopedics/Podiatry;  Laterality: Right;   LEFT HEART CATH AND CORONARY ANGIOGRAPHY Left 02/12/2021   Procedure: LEFT HEART CATH AND CORONARY ANGIOGRAPHY;  Surgeon: Iran Ouch, MD;  Location: [facility] INV[facility] CV [facility];  Service: Cardiovascular;  Laterality: Left;   LEFT HEART CATH AND CORONARY  ANGIOGRAPHY Left 11/29/2022   Procedure: LEFT HEART CATH AND CORONARY ANGIOGRAPHY;  Surgeon: Iran Ouch, MD;  Location: [facility] INV[facility] CV [facility];  Service: Cardiovascular;  Laterality: Left;   LEFT HEART CATHETERIZATION WITH CORONARY ANGIOGRAM N/A 06/04/2011   Procedure: LEFT HEART CATHETERIZATION WITH CORONARY ANGIOGRAM;  Surgeon: Othella Boyer, MD;  Location: [facility] CATH LAB;  Service: Cardiovascular;  Laterality: N/A;   LEFT HEART CATHETERIZATION WITH CORONARY ANGIOGRAM N/A 11/18/2013   Procedure: LEFT HEART CATHETERIZATION WITH CORONARY ANGIOGRAM;  Surgeon: Lesleigh Noe, MD;  Location: [facility] CATH LAB;  Service: Cardiovascular;  Laterality: N/A;   PERCUTANEOUS CORONARY STENT INTERVENTION (PCI-S)  11/18/2013   Procedure: PERCUTANEOUS CORONARY STENT INTERVENTION (PCI-S);  Surgeon: Lesleigh Noe, MD;  Location: [facility] CATH LAB;  Service: Cardiovascular;;   SHOULDER ARTHROSCOPY WITH OPEN ROTATOR CUFF REPAIR Right 11/24/2018   Procedure: SHOULDER ARTHROSCOPY WITH OPEN MINI ROTATOR CUFF REPAIR, SUBACROMIAL DECOMPRESSION, DISTAL CLAVICLE EXCISION, BICEPS TENODESIS;  Surgeon: Juanell Fairly, MD;  Location: [facility] ORS;  Service: Orthopedics;  Laterality: Right;    Current Medications: Current  Meds  Medication Sig   amLODipine (NORVASC) 10 MG tablet Take 1 tablet (10 mg total) by mouth daily.   aspirin EC 81 MG tablet Take 1 tablet (81 mg total) by mouth daily.   atorvastatin (LIPITOR) 80 MG tablet TAKE ONE TABLET BY MOUTH EVERY EVENING   clopidogrel (PLAVIX) 75 MG tablet Take 1 tablet (75 mg total) by mouth daily.   diclofenac sodium (VOLTAREN) 1 % GEL Apply 1 application topically 3 (three) times daily. Apply to feet   ezetimibe (ZETIA) 10 MG tablet Take 1 tablet (10 mg total) by mouth daily.   hydrOXYzine (VISTARIL) 25 MG capsule Take 50 mg by mouth every 8 (eight) hours as needed.   losartan-hydrochlorothiazide (HYZAAR) 100-25 MG tablet Take 1 tablet by mouth daily.   naloxone (NARCAN) nasal spray  4 mg/0.1 mL    Oxycodone HCl 10 MG TABS Take 10 mg by mouth in the morning, at noon, in the evening, and at bedtime. Break pill in half, takes PRN through out day   polycarbophil (FIBERCON) 625 MG tablet Take 1,250 mg by mouth daily.   potassium chloride (KLOR-CON) 10 MEQ tablet Take 1 tablet (10 mEq total) by mouth daily.   sertraline (ZOLOFT) 25 MG tablet Take 75 mg by mouth daily.   silodosin (RAPAFLO) 8 MG CAPS capsule Take 1 capsule (8 mg total) by mouth daily with breakfast.   zolpidem (AMBIEN) 10 MG tablet Take 10 mg by mouth at bedtime.    Allergies:   Patient has no known allergies.   Social History   Socioeconomic History   Marital status: Married    Spouse name: Not on file   Number of children: Not on file   Years of education: Not on file   Highest education level: Not on file  Occupational History   Not on file  Tobacco Use   Smoking status: Never    Passive exposure: Never   Smokeless tobacco: Never  Vaping Use   Vaping status: Never Used  Substance and Sexual Activity   Alcohol use: Not Currently   Drug use: No   Sexual activity: Yes  Other Topics Concern   Not on file  Social History Narrative   Not on file   Social Drivers of Health   Financial Resource Strain: Not on file  Food Insecurity: Not on file  Transportation Needs: Not on file  Physical Activity: Not on file  Stress: Not on file  Social Connections: Not on file     Family History:  The patient's family history includes Coronary artery disease (age of onset: 36) in his father; Coronary artery disease (age of onset: 27) in his brother; Kidney cancer in his brother; Lupus in his daughter; Non-Hodgkin's lymphoma in his mother.  ROS:   12-point review of systems is negative unless otherwise noted in the HPI.   EKGs/Labs/Other Studies Reviewed:    Studies reviewed were summarized above. The additional studies were reviewed today:  2D echo 12/31/2022: 1. Left ventricular ejection fraction,  by estimation, is 50 to 55%. The  left ventricle has low normal function. The left ventricle has no regional  wall motion abnormalities. There is mild left ventricular hypertrophy.  Left ventricular diastolic  parameters are consistent with Grade I diastolic dysfunction (impaired  relaxation).   2. Right ventricular systolic function is normal. The right ventricular  size is normal.   3. Left atrial size was mildly dilated.   4. The mitral valve is normal in structure. Mild mitral valve  regurgitation. No evidence of mitral stenosis.   5. The aortic valve is tricuspid. Aortic valve regurgitation is not  visualized. No aortic stenosis is present.   6. The inferior vena cava is normal in size with greater than 50%  respiratory variability, suggesting right atrial pressure of 3 mmHg.  __________  LHC 11/29/2022:   Prox LAD to Mid LAD lesion is 40% stenosed.   Mid Cx lesion is 10% stenosed.   Mid Cx to Dist Cx lesion is 50% stenosed.   Prox RCA to Mid RCA lesion is 100% stenosed.   Ost RCA to Prox RCA lesion is 50% stenosed.   3rd Mrg lesion is 40% stenosed.   1st Mrg lesion is 90% stenosed.   Dist LAD lesion is 30% stenosed.   Non-stenotic Mid LAD lesion was previously treated.   Non-stenotic Prox Cx to Mid Cx lesion was previously treated.   The left ventricular systolic function is normal.   LV end diastolic pressure is mildly elevated.   The left ventricular ejection fraction is 55-65% by visual estimate.   1.  Significant underlying three-vessel coronary artery disease with patent stents in the LAD and left circumflex with no significant restenosis.  Chronically occluded RCA with left-to-right collaterals.  Dual LAD system. 2.  Normal LV systolic function and mildly elevated left ventricular end-diastolic pressure.   Recommendations: No significant change since most recent cardiac catheterization.  Continue medical therapy. __________  Renal artery ultrasound  04/05/2021: Right: Normal size right kidney. Normal right Resisitive Index.         Normal cortical thickness of right kidney. No evidence of         right renal artery stenosis. RRV flow present.  Left:  Normal size of left kidney. Normal left Resistive Index.         Normal cortical thickness of the left kidney. No evidence of         left renal artery stenosis. LRV flow present.  Mesenteric:  Normal Celiac artery and Superior Mesenteric artery findings.  __________   LHC 02/12/2021:   Prox RCA to Mid RCA lesion is 100% stenosed.   Ost RCA to Prox RCA lesion is 50% stenosed.   Mid Cx lesion is 10% stenosed.   Mid Cx to Dist Cx lesion is 50% stenosed.   3rd Mrg lesion is 40% stenosed.   1st Mrg lesion is 90% stenosed.   Prox LAD to Mid LAD lesion is 40% stenosed.   Prox Cx to Mid Cx lesion is 70% stenosed.   Previously placed Mid LAD stent (unknown type) is  widely patent.   A drug-eluting stent was successfully placed using a STENT ONYX FRONTIER [id number].   Post intervention, there is a 0% residual stenosis.   The left ventricular systolic function is normal.   LV end diastolic pressure is normal.   The left ventricular ejection fraction is 55-65% by visual estimate.   1.  Significant underlying three-vessel coronary artery disease with patent stents in the left circumflex and LAD.  Chronically occluded right coronary artery with left-to-right collaterals.  There is 70% stenosis in the proximal/mid left circumflex at the proximal edge of the previously placed stent.  This was significant by fractional flow reserve evaluation with an IFR ratio of 0.85.  There is also moderate disease in the left circumflex distal to the old stent.  This was not significant by fractional flow reserve evaluation after treating the more proximal lesion.  OM1 is small in diameter with  severe diffuse disease.  2.  Normal LV systolic function and high normal left ventricular end-diastolic pressure. 3.   Successful drug-eluting stent placement to the proximal/mid left circumflex overlapping the previously placed stent.   Recommendations: Continue dual antiplatelet therapy indefinitely if tolerated given multiple stents. Aggressive treatment of risk factors. __________   2D echo 05/05/2020: 1. Left ventricular ejection fraction, by estimation, is 50 to 55%. The  left ventricle has low normal function. Parasternal short axis images  suggestive of basal inferior wall hypokineis. There is mild left  ventricular hypertrophy. Left ventricular  diastolic parameters are consistent with Grade I diastolic dysfunction  (impaired relaxation).   2. Right ventricular systolic function is normal. The right ventricular  size is normal.   3. Left atrial size was moderately dilated.   4. The aortic valve is normal in structure.Trileaflet. Aortic valve  regurgitation is not visualized. No aortic stenosis is present.   5. Challenging images.    EKG:  EKG is ordered today.  The EKG ordered today demonstrates NSR, 65 bpm, no acute ST-T changes  Recent Labs: 11/25/2022: ALT 24; Hemoglobin 15.1; Platelets 134 12/16/2022: BUN 14; Creatinine, Ser 1.01; Potassium 3.7; Sodium 142  Recent Lipid Panel    Component Value Date/Time   CHOL 110 11/25/2022 0905   TRIG 127 11/25/2022 0905   HDL 31 (L) 11/25/2022 0905   CHOLHDL 3.5 11/25/2022 0905   CHOLHDL 3.7 11/18/2013 0246   VLDL 38 11/18/2013 0246   LDLCALC 56 11/25/2022 0905   LDLDIRECT 59 04/24/2021 1102    PHYSICAL EXAM:    VS:  BP (!) 158/72 (BP Location: Left Arm, Patient Position: Supine, Cuff Size: Normal)   Pulse 65   Ht 5\' 6"  (1.676 m)   Wt 195 lb 9.6 oz (88.7 kg)   SpO2 96%   BMI 31.57 kg/m   BMI: Body mass index is 31.57 kg/m.  Physical Exam Vitals reviewed.  Constitutional:      Appearance: He is well-developed.  HENT:     Head: Normocephalic and atraumatic.  Eyes:     General:        Right eye: No discharge.        Left eye: No  discharge.  Neck:     Vascular: No JVD.  Cardiovascular:     Rate and Rhythm: Normal rate and regular rhythm.     Heart sounds: Normal heart sounds, S1 normal and S2 normal. Heart sounds not distant. No midsystolic click and no opening snap. No murmur heard.    No friction rub.  Pulmonary:     Effort: Pulmonary effort is normal. No respiratory distress.     Breath sounds: Normal breath sounds. No decreased breath sounds, wheezing, rhonchi or rales.  Chest:     Chest wall: No tenderness.  Abdominal:     General: There is no distension.  Musculoskeletal:     Cervical back: Normal range of motion.     Comments: Right foot in postop shoe.  Skin:    General: Skin is warm and dry.     Nails: There is no clubbing.  Neurological:     Mental Status: He is alert and oriented to person, place, and time.  Psychiatric:        Speech: Speech normal.        Behavior: Behavior normal.        Thought Content: Thought content normal.        Judgment: Judgment normal.     Wt Readings from  Last 3 Encounters:  04/29/23 195 lb 9.6 oz (88.7 kg)  04/17/23 197 lb (89.4 kg)  01/23/23 195 lb (88.5 kg)     ASSESSMENT & PLAN:   CAD involving the native coronary arteries without angina: He is doing well and without symptoms concerning for angina or cardiac decompensation.  Continue aggressive risk factor modification including aspirin, clopidogrel, atorvastatin, ezetimibe, and losartan.  No indication for further ischemic testing at this time.  HFimpEF secondary to ICM: He is euvolemic and well compensated.  He remains on losartan.  Not on beta-blocker secondary to resting sinus bradycardia.  Not requiring a standing loop diuretic.  Should he redevelop cardiomyopathy, or develops symptoms of heart failure would look to escalate GDMT at that time.  HTN: Blood pressure is mildly elevated in the office today.  Has historically sometimes skips losartan/HCTZ.  He will check blood pressure at home.  Continue  to monitor.  For now, remains on amlodipine 10 mg and losartan/HCTZ 100/25 mg.  HLD: LDL 56 in 11/2022 with normal AST/ALT at that time.  He remains on atorvastatin 80 mg and ezetimibe.  PVCs: Quiescent.  No longer on beta-blocker given resting heart rates typically in the 50s to 60s bpm.  OSA: CPAP.  Preoperative cardiac risk stratification: He will be undergoing surgical repair of ectropion of the bilateral lower eyelids.  From a cardiac perspective, he is doing well and without symptoms concerning for angina or cardiac decompensation.  He recently underwent a diagnostic cardiac cath on 11/29/2022 that showed stable coronary anatomy with patent stents in the LAD and LCx along with chronically occluded RCA with left-to-right collaterals.  There was no significant change when compared to his cardiac cath in 02/2021.  He may proceed with noncardiac procedure without further cardiac testing at an overall low risk.  Recommend clopidogrel be held 5 days prior to surgery.  Continue aspirin 81 mg daily throughout the perioperative timeframe as long as the bleeding risk is not deemed to be too great.  Resume Plavix postoperatively when it is felt to be safe from a bleeding perspective.    Disposition: F/u with Dr. Kirke Corin or an APP in 6 months.   Medication Adjustments/Labs and Tests Ordered: Current medicines are reviewed at length with the patient today.  Concerns regarding medicines are outlined above. Medication changes, Labs and Tests ordered today are summarized above and listed in the Patient Instructions accessible in Encounters.   Signed, Eula Listen, PA-C 04/29/2023 1:14 PM     [facility] [place] [address] [place], [place] [address] [phone]

## 2023-04-29 NOTE — Patient Instructions (Signed)
Medication Instructions:  Your Physician recommend you continue on your current medication as directed.    *If you need a refill on your cardiac medications before your next appointment, please call your pharmacy*   Lab Work: None ordered at this time  If you have labs (blood work) drawn today and your tests are completely normal, you will receive your results only by: MyChart Message (if you have MyChart) OR A paper copy in the mail If you have any lab test that is abnormal or we need to change your treatment, we will call you to review the results.   Follow-Up: At [facility], you and your health needs are our priority.  As part of our continuing mission to provide you with exceptional heart care, we have created designated Provider Care Teams.  These Care Teams include your primary Cardiologist (physician) and Advanced Practice Providers (APPs -  Physician Assistants and Nurse Practitioners) who all work together to provide you with the care you need, when you need it.   Your next appointment:   6 month(s)  Provider:   You may see Lorine Bears, MD or one of the following Advanced Practice Providers on your designated Care Team:   Eula Listen, [place]

## 2023-05-21 ENCOUNTER — Other Ambulatory Visit: Payer: Self-pay | Admitting: Cardiovascular Disease

## 2023-05-22 NOTE — Telephone Encounter (Signed)
Last office visit:  04/29/23  with plan to f/u in 6 months  Next office visit: none/active recall

## 2023-07-29 ENCOUNTER — Other Ambulatory Visit: Payer: Self-pay | Admitting: Cardiovascular Disease

## 2023-09-15 ENCOUNTER — Other Ambulatory Visit: Payer: Self-pay

## 2023-09-15 MED ORDER — EZETIMIBE 10 MG PO TABS: 10.0000 mg | ORAL_TABLET | Freq: Every day | ORAL | 0 refills | Status: DC

## 2023-10-28 ENCOUNTER — Other Ambulatory Visit: Payer: Self-pay

## 2023-10-28 MED ORDER — AMLODIPINE BESYLATE 10 MG PO TABS: 10.0000 mg | ORAL_TABLET | Freq: Every day | ORAL | 1 refills | Status: DC

## 2023-11-15 ENCOUNTER — Other Ambulatory Visit: Payer: Self-pay | Admitting: Cardiovascular Disease

## 2023-12-03 DIAGNOSIS — G894 Chronic pain syndrome: Secondary | ICD-10-CM | POA: Diagnosis not present

## 2023-12-03 DIAGNOSIS — M47816 Spondylosis without myelopathy or radiculopathy, lumbar region: Secondary | ICD-10-CM | POA: Diagnosis not present

## 2023-12-03 DIAGNOSIS — F1120 Opioid dependence, uncomplicated: Secondary | ICD-10-CM | POA: Diagnosis not present

## 2023-12-09 ENCOUNTER — Other Ambulatory Visit: Payer: Self-pay

## 2023-12-09 DIAGNOSIS — E876 Hypokalemia: Secondary | ICD-10-CM

## 2023-12-10 MED ORDER — POTASSIUM CHLORIDE ER 10 MEQ PO TBCR: 10.0000 meq | EXTENDED_RELEASE_TABLET | Freq: Every day | ORAL | 1 refills | Status: DC

## 2023-12-16 NOTE — Progress Notes (Unsigned)
 Cardiology Office Note    Date:  12/16/2023   ID:  Terry Cook, DOB Dec 15, 1950, MRN [id number]  PCP:  Pcp, No  Cardiologist:  Deatrice Cage, MD  Electrophysiologist:  None   Chief Complaint: Follow-up  History of Present Illness:   Terry Cook is a 73 y.o. male with history of CAD with prior MI in 2013 status post PCI to the LCx with unstable angina in 2015 s/p PCI to the LAD with PCI to the proximal/mid LCx overlapping the previously placed stent in 02/2021, ICM with subsequent improvement in LV systolic function, strong family history of premature coronary artery disease, PVCs, HTN, HLD, anxiety, BPH, OSA on CPAP, arthritis, and chronic low back pain who presents for follow-up of his CAD and ICM.   He was admitted with an MI in 2013.  He underwent PCI to the LCx and was found to have a chronically occluded RCA with left-to-right collaterals at that time.  EF at that time was 40 to 45%.  He presented with unstable angina in 2015 and was found to have a patent LCx stent, though there was 90% stenosis in the mid LAD which was treated successfully with PCI/DES.  He was seen in the office in 04/2020 and was doing reasonably well.  Over the preceding year, he did report 2 brief episodes of chest discomfort.  There were no exertional symptoms with recommendation to continue medical therapy.  He underwent echo in 04/2020 which showed an improvement in his LV systolic function with a low normal EF of 50 to 55%, possible basal inferior wall hypokinesis, mild LVH, grade 1 diastolic dysfunction, normal RV systolic function and ventricular cavity size, moderately dilated left atrium, and no noted significant valvular abnormalities.  He was seen in the office on 01/29/2021 noting intermittent randomly occurring profound fatigue/exhaustion as well as chest pressure. These symptoms felt similar to his prior angina. Given symptoms, he underwent LHC on 02/12/2021 which demonstrated significant 3-vessel CAD  with patent stents in the LAD and LCx. The RCA was chronically occluded with left-to-right collaterals. There was 70% stenosis in the proximal/mid LCx at the proximal edge of the previously placed stent. This was significant by fractional flow reserve evaluation with an iFR ratio of 0.85. There was also moderate disease in the LCx distal to the old stent. This was not significant by fractional flow reserve evaluation after treating the more proximal lesion. The OM1 was small in diameter with severe diffuse disease. Normal LV systolic function and high normal left ventricular end-diastolic pressure. He underwent successful drug-eluting stent placement to the proximal/mid LCx overlapping the previously placed stent.  Post cath, he felt much better.  Renal artery ultrasound in 03/2021 was negative for RAS bilaterally.   In the setting of of substernal chest pain concerning for progressive angina noted in 11/2022, he underwent LHC in 11/2022 that showed significant underlying three-vessel CAD with patent stents in the LAD and LCx with no significant restenosis.  Chronically occluded RCA with left-to-right collaterals.  Dual LAD system.  No significant change when compared to [facility] in 2022.  Echo in 12/2022 showed an EF of 50 to 55%, no regional wall motion abnormalities, mild LVH, grade 1 diastolic dysfunction, normal RV systolic function and ventricular cavity size, mildly dilated left atrium, mild mitral regurgitation, and an estimated right atrial pressure of 3 mmHg.  He was last seen in the office in 04/2023 and doing well from a cardiac perspective.  Functional status continued to be limited  by back pain and foot pain.  He was also risk stratified for ectropion repair.  ***   Labs independently reviewed: 12/2022 - BUN 14, serum creatinine 1.01, potassium 3.7 11/2022 - TC 110, TG 127, HDL 31, LDL 56, albumin 4.3, AST/ALT normal, Hgb 15.1, PLT 134  Past Medical History:  Diagnosis Date   Anxiety    CAD  (coronary artery disease), native coronary artery 06/04/2011   s/p MI with PCI to left circ   Chronic lower back pain    DDD (degenerative disc disease), lumbar    Depression    Gout    Grade I diastolic dysfunction    High cholesterol    History of ischemic cardiomyopathy    History of kidney stones    Hypertension    Irregular heart beats    MI (myocardial infarction) (HCC) 05/2011   Mild mitral regurgitation by prior echocardiogram    Opioid dependence (HCC)    OSA on CPAP     Past Surgical History:  Procedure Laterality Date   COLONOSCOPY     CORONARY ANGIOPLASTY WITH STENT PLACEMENT  05/2011   1   CORONARY PRESSURE/FFR WITH 3D MAPPING N/A 02/12/2021   Procedure: Coronary Pressure Wire/FFR w/3D Mapping;  Surgeon: Darron Deatrice LABOR, MD;  Location: [facility] INVASIVE CV [facility];  Service: Cardiovascular;  Laterality: N/A;   CORONARY STENT INTERVENTION N/A 02/12/2021   Procedure: CORONARY STENT INTERVENTION;  Surgeon: Darron Deatrice LABOR, MD;  Location: [facility] INVAS[facility] CV [facility];  Service: Cardiovascular;  Laterality: N/A;   CYST EXCISION Right ~ 2009   index finger; in office   CYSTOSCOPY W/ STONE MANIPULATION  ~ 1975 X 2   GANGLION CYST EXCISION Right 04/17/2023   Procedure: 28090 - EXCISION; GANGLION CYST FOURTH TOE;  Surgeon: Lennie Barter, DPM;  Location: [facility];  Service: Orthopedics/Podiatry;  Laterality: Right;   LEFT HEART CATH AND CORONARY ANGIOGRAPHY Left 02/12/2021   Procedure: LEFT HEART CATH AND CORONARY ANGIOGRAPHY;  Surgeon: Darron Deatrice LABOR, MD;  Location: [facility] IN[facility] CV [facility];  Service: Cardiovascular;  Laterality: Left;   LEFT HEART CATH AND CORONARY ANGIOGRAPHY Left 11/29/2022   Procedure: LEFT HEART CATH AND CORONARY ANGIOGRAPHY;  Surgeon: Darron Deatrice LABOR, MD;  Location: [facility] CV [facility];  Service: Cardiovascular;  Laterality: Left;   LEFT HEART CATHETERIZATION WITH CORONARY ANGIOGRAM N/A 06/04/2011   Procedure: LEFT HEART CATHETERIZATION WITH CORONARY  ANGIOGRAM;  Surgeon: Elsie GORMAN Somerset, MD;  Location: [facility] CATH LAB;  Service: Cardiovascular;  Laterality: N/A;   LEFT HEART CATHETERIZATION WITH CORONARY ANGIOGRAM N/A 11/18/2013   Procedure: LEFT HEART CATHETERIZATION WITH CORONARY ANGIOGRAM;  Surgeon: Victory LELON Claudene DOUGLAS, MD;  Location: [facility] CATH LAB;  Service: Cardiovascular;  Laterality: N/A;   PERCUTANEOUS CORONARY STENT INTERVENTION (PCI-S)  11/18/2013   Procedure: PERCUTANEOUS CORONARY STENT INTERVENTION (PCI-S);  Surgeon: Victory LELON Claudene DOUGLAS, MD;  Location: [facility] C[facility];  Service: Cardiovascular;;   SHOULDER ARTHROSCOPY WITH OPEN ROTATOR CUFF REPAIR Right 11/24/2018   Procedure: SHOULDER ARTHROSCOPY WITH OPEN MINI ROTATOR CUFF REPAIR, SUBACROMIAL DECOMPRESSION, DISTAL CLAVICLE EXCISION, BICEPS TENODESIS;  Surgeon: Marchia Drivers, MD;  Location: [facility] ORS;  Service: Orthopedics;  Laterality: Right;    Current Medications: No outpatient medications have been marked as taking for the 12/18/23 encounter (Appointment) with Abigail Bernardino HERO, PA-C.    Allergies:   Patient has no known allergies.   Social History   Socioeconomic History   Marital status: Married    Spouse name: Not on file   Number of  children: Not on file   Years of education: Not on file   Highest education level: Not on file  Occupational History   Not on file  Tobacco Use   Smoking status: Never    Passive exposure: Never   Smokeless tobacco: Never  Vaping Use   Vaping status: Never Used  Substance and Sexual Activity   Alcohol use: Not Currently   Drug use: No   Sexual activity: Yes  Other Topics Concern   Not on file  Social History Narrative   Not on file   Social Drivers of Health   Financial Resource Strain: Medium Risk (05/02/2023)   Received from [facility]   Overall Financial Resource Strain (CARDIA)    Difficulty of Paying Living Expenses: Somewhat hard  Food Insecurity: Food Insecurity Present (05/02/2023)   Received from [facility]   Hunger Vital Sign    Within the past 12 months, you worried that your food would run out before you got the money to buy more.: Sometimes true    Within the past 12 months, the food you bought just didn't last and you didn't have money to get more.: Never true  Transportation Needs: No Transportation Needs (05/02/2023)   Received from [facility] - Transportation    In the past 12 months, has lack of transportation kept you from medical appointments or from getting medications?: No    Lack of Transportation (Non-Medical): No  Physical Activity: Not on file  Stress: Not on file  Social Connections: Not on file     Family History:  The patient's family history includes Coronary artery disease (age of onset: 100) in his father; Coronary artery disease (age of onset: 81) in his brother; Kidney cancer in his brother; Lupus in his daughter; Non-Hodgkin's lymphoma in his mother.  ROS:   12-point review of systems is negative unless otherwise noted in the HPI.   EKGs/Labs/Other Studies Reviewed:    Studies reviewed were summarized above. The additional studies were reviewed today:  2D echo 12/31/2022: 1. Left ventricular ejection fraction, by estimation, is 50 to 55%. The  left ventricle has low normal function. The left ventricle has no regional  wall motion abnormalities. There is mild left ventricular hypertrophy.  Left ventricular diastolic  parameters are consistent with Grade I diastolic dysfunction (impaired  relaxation).   2. Right ventricular systolic function is normal. The right ventricular  size is normal.   3. Left atrial size was mildly dilated.   4. The mitral valve is normal in structure. Mild mitral valve  regurgitation. No evidence of mitral stenosis.   5. The aortic valve is tricuspid. Aortic valve regurgitation is not  visualized. No aortic stenosis is present.   6. The inferior vena cava is normal in size with  greater than 50%  respiratory variability, suggesting right atrial pressure of 3 mmHg.  __________   LHC 11/29/2022:   Prox LAD to Mid LAD lesion is 40% stenosed.   Mid Cx lesion is 10% stenosed.   Mid Cx to Dist Cx lesion is 50% stenosed.   Prox RCA to Mid RCA lesion is 100% stenosed.   Ost RCA to Prox RCA lesion is 50% stenosed.   3rd Mrg lesion is 40% stenosed.   1st Mrg lesion is 90% stenosed.   Dist LAD lesion is 30% stenosed.   Non-stenotic Mid LAD lesion was previously treated.   Non-stenotic Prox Cx to Mid Cx lesion  was previously treated.   The left ventricular systolic function is normal.   LV end diastolic pressure is mildly elevated.   The left ventricular ejection fraction is 55-65% by visual estimate.   1.  Significant underlying three-vessel coronary artery disease with patent stents in the LAD and left circumflex with no significant restenosis.  Chronically occluded RCA with left-to-right collaterals.  Dual LAD system. 2.  Normal LV systolic function and mildly elevated left ventricular end-diastolic pressure.   Recommendations: No significant change since most recent cardiac catheterization.  Continue medical therapy. __________   Renal artery ultrasound 04/05/2021: Right: Normal size right kidney. Normal right Resisitive Index.         Normal cortical thickness of right kidney. No evidence of         right renal artery stenosis. RRV flow present.  Left:  Normal size of left kidney. Normal left Resistive Index.         Normal cortical thickness of the left kidney. No evidence of         left renal artery stenosis. LRV flow present.  Mesenteric:  Normal Celiac artery and Superior Mesenteric artery findings.  __________   LHC 02/12/2021:   Prox RCA to Mid RCA lesion is 100% stenosed.   Ost RCA to Prox RCA lesion is 50% stenosed.   Mid Cx lesion is 10% stenosed.   Mid Cx to Dist Cx lesion is 50% stenosed.   3rd Mrg lesion is 40% stenosed.   1st Mrg lesion is 90%  stenosed.   Prox LAD to Mid LAD lesion is 40% stenosed.   Prox Cx to Mid Cx lesion is 70% stenosed.   Previously placed Mid LAD stent (unknown type) is  widely patent.   A drug-eluting stent was successfully placed using a STENT ONYX FRONTIER [id number].   Post intervention, there is a 0% residual stenosis.   The left ventricular systolic function is normal.   LV end diastolic pressure is normal.   The left ventricular ejection fraction is 55-65% by visual estimate.   1.  Significant underlying three-vessel coronary artery disease with patent stents in the left circumflex and LAD.  Chronically occluded right coronary artery with left-to-right collaterals.  There is 70% stenosis in the proximal/mid left circumflex at the proximal edge of the previously placed stent.  This was significant by fractional flow reserve evaluation with an IFR ratio of 0.85.  There is also moderate disease in the left circumflex distal to the old stent.  This was not significant by fractional flow reserve evaluation after treating the more proximal lesion.  OM1 is small in diameter with severe diffuse disease.  2.  Normal LV systolic function and high normal left ventricular end-diastolic pressure. 3.  Successful drug-eluting stent placement to the proximal/mid left circumflex overlapping the previously placed stent.   Recommendations: Continue dual antiplatelet therapy indefinitely if tolerated given multiple stents. Aggressive treatment of risk factors. __________   2D echo 05/05/2020: 1. Left ventricular ejection fraction, by estimation, is 50 to 55%. The  left ventricle has low normal function. Parasternal short axis images  suggestive of basal inferior wall hypokineis. There is mild left  ventricular hypertrophy. Left ventricular  diastolic parameters are consistent with Grade I diastolic dysfunction  (impaired relaxation).   2. Right ventricular systolic function is normal. The right ventricular  size is normal.    3. Left atrial size was moderately dilated.   4. The aortic valve is normal in structure.Trileaflet. Aortic valve  regurgitation  is not visualized. No aortic stenosis is present.   5. Challenging images.    EKG:  EKG is ordered today.  The EKG ordered today demonstrates ***  Recent Labs: 12/16/2022: BUN 14; Creatinine, Ser 1.01; Potassium 3.7; Sodium 142  Recent Lipid Panel    Component Value Date/Time   CHOL 110 11/25/2022 0905   TRIG 127 11/25/2022 0905   HDL 31 (L) 11/25/2022 0905   CHOLHDL 3.5 11/25/2022 0905   CHOLHDL 3.7 11/18/2013 0246   VLDL 38 11/18/2013 0246   LDLCALC 56 11/25/2022 0905   LDLDIRECT 59 04/24/2021 1102    PHYSICAL EXAM:    VS:  There were no vitals taken for this visit.  BMI: There is no height or weight on file to calculate BMI.  Physical Exam  Wt Readings from Last 3 Encounters:  04/29/23 195 lb 9.6 oz (88.7 kg)  04/17/23 197 lb (89.4 kg)  01/23/23 195 lb (88.5 kg)     ASSESSMENT & PLAN:   CAD involving the native coronary arteries without angina:  HFimpEF secondary to ICM:  HTN: Blood pressure  HLD: LDL 56 in 11/2022 with normal AST/ALT at that time.  PVCs:  OSA:   {Are you ordering a CV Procedure (e.g. stress test, cath, DCCV, TEE, etc)?   Press F2        :[id number]}     Disposition: F/u with Dr. Darron or an APP in ***.   Medication Adjustments/Labs and Tests Ordered: Current medicines are reviewed at length with the patient today.  Concerns regarding medicines are outlined above. Medication changes, Labs and Tests ordered today are summarized above and listed in the Patient Instructions accessible in Encounters.   Signed, Bernardino Bring, PA-C 12/16/2023 8:58 AM     [facility] -  [address] [place], [place] [address] [phone]

## 2023-12-18 ENCOUNTER — Ambulatory Visit: Attending: Physician Assistant | Admitting: Physician Assistant

## 2023-12-18 ENCOUNTER — Encounter: Payer: Self-pay | Admitting: Physician Assistant

## 2023-12-18 VITALS — BP 120/60 | HR 60 | Ht 66.0 in | Wt 209.6 lb

## 2023-12-18 DIAGNOSIS — Z01810 Encounter for preprocedural cardiovascular examination: Secondary | ICD-10-CM

## 2023-12-18 DIAGNOSIS — G4733 Obstructive sleep apnea (adult) (pediatric): Secondary | ICD-10-CM | POA: Diagnosis not present

## 2023-12-18 DIAGNOSIS — I493 Ventricular premature depolarization: Secondary | ICD-10-CM | POA: Diagnosis not present

## 2023-12-18 DIAGNOSIS — I5032 Chronic diastolic (congestive) heart failure: Secondary | ICD-10-CM

## 2023-12-18 DIAGNOSIS — I10 Essential (primary) hypertension: Secondary | ICD-10-CM

## 2023-12-18 DIAGNOSIS — E785 Hyperlipidemia, unspecified: Secondary | ICD-10-CM

## 2023-12-18 DIAGNOSIS — Z79899 Other long term (current) drug therapy: Secondary | ICD-10-CM

## 2023-12-18 DIAGNOSIS — I255 Ischemic cardiomyopathy: Secondary | ICD-10-CM

## 2023-12-18 DIAGNOSIS — I2510 Atherosclerotic heart disease of native coronary artery without angina pectoris: Secondary | ICD-10-CM | POA: Diagnosis not present

## 2023-12-18 NOTE — Patient Instructions (Signed)
 Medication Instructions:  Your physician recommends that you continue on your current medications as directed. Please refer to the Current Medication list given to you today.   *If you need a refill on your cardiac medications before your next appointment, please call your pharmacy*  Lab Work: Your provider would like for you to have following labs drawn today Lipid panel and BMP.   If you have labs (blood work) drawn today and your tests are completely normal, you will receive your results only by: MyChart Message (if you have MyChart) OR A paper copy in the mail If you have any lab test that is abnormal or we need to change your treatment, we will call you to review the results.  Follow-Up: At [facility], you and your health needs are our priority.  As part of our continuing mission to provide you with exceptional heart care, our providers are all part of one team.  This team includes your primary Cardiologist (physician) and Advanced Practice Providers or APPs (Physician Assistants and Nurse Practitioners) who all work together to provide you with the care you need, when you need it.  Your next appointment:   6 month(s)  Provider:   You may see Deatrice Cage, MD or Bernardino Bring, PA-C

## 2023-12-19 ENCOUNTER — Ambulatory Visit: Payer: Self-pay | Admitting: Physician Assistant

## 2023-12-19 DIAGNOSIS — Z79899 Other long term (current) drug therapy: Secondary | ICD-10-CM

## 2023-12-19 DIAGNOSIS — E876 Hypokalemia: Secondary | ICD-10-CM

## 2023-12-19 LAB — LIPID PANEL
Chol/HDL Ratio: 3.1 ratio (ref 0.0–5.0)
Cholesterol, Total: 101 mg/dL (ref 100–199)
HDL: 33 mg/dL — ABNORMAL LOW (ref >39)
LDL Chol Calc (NIH): 47 mg/dL (ref 0–99)
Triglycerides: 112 mg/dL (ref 0–149)
VLDL Cholesterol Cal: 21 mg/dL (ref 5–40)

## 2023-12-19 LAB — BASIC METABOLIC PANEL WITH GFR
BUN/Creatinine Ratio: 21 (ref 10–24)
BUN: 22 mg/dL (ref 8–27)
CO2: 28 mmol/L (ref 20–29)
Calcium: 9.3 mg/dL (ref 8.6–10.2)
Chloride: 98 mmol/L (ref 96–106)
Creatinine, Ser: 1.07 mg/dL (ref 0.76–1.27)
Glucose: 84 mg/dL (ref 70–99)
Potassium: 3.2 mmol/L — ABNORMAL LOW (ref 3.5–5.2)
Sodium: 143 mmol/L (ref 134–144)
eGFR: 73 mL/min/1.73 (ref >59)

## 2023-12-22 MED ORDER — POTASSIUM CHLORIDE ER 10 MEQ PO TBCR: EXTENDED_RELEASE_TABLET | ORAL | 3 refills | Status: DC

## 2024-01-10 ENCOUNTER — Other Ambulatory Visit: Payer: Self-pay | Admitting: Cardiology

## 2024-01-15 DIAGNOSIS — D696 Thrombocytopenia, unspecified: Secondary | ICD-10-CM | POA: Diagnosis not present

## 2024-01-15 DIAGNOSIS — N401 Enlarged prostate with lower urinary tract symptoms: Secondary | ICD-10-CM | POA: Diagnosis not present

## 2024-01-15 DIAGNOSIS — E876 Hypokalemia: Secondary | ICD-10-CM | POA: Diagnosis not present

## 2024-01-15 DIAGNOSIS — E7800 Pure hypercholesterolemia, unspecified: Secondary | ICD-10-CM | POA: Diagnosis not present

## 2024-01-15 DIAGNOSIS — G8929 Other chronic pain: Secondary | ICD-10-CM | POA: Diagnosis not present

## 2024-01-15 DIAGNOSIS — I10 Essential (primary) hypertension: Secondary | ICD-10-CM | POA: Diagnosis not present

## 2024-01-15 DIAGNOSIS — F419 Anxiety disorder, unspecified: Secondary | ICD-10-CM | POA: Diagnosis not present

## 2024-01-15 DIAGNOSIS — I2510 Atherosclerotic heart disease of native coronary artery without angina pectoris: Secondary | ICD-10-CM | POA: Diagnosis not present

## 2024-01-15 DIAGNOSIS — G4700 Insomnia, unspecified: Secondary | ICD-10-CM | POA: Diagnosis not present

## 2024-02-24 DIAGNOSIS — G894 Chronic pain syndrome: Secondary | ICD-10-CM | POA: Diagnosis not present

## 2024-02-24 DIAGNOSIS — F1120 Opioid dependence, uncomplicated: Secondary | ICD-10-CM | POA: Diagnosis not present

## 2024-03-02 DIAGNOSIS — M47816 Spondylosis without myelopathy or radiculopathy, lumbar region: Secondary | ICD-10-CM | POA: Diagnosis not present

## 2024-03-08 DIAGNOSIS — M461 Sacroiliitis, not elsewhere classified: Secondary | ICD-10-CM | POA: Diagnosis not present

## 2024-03-08 DIAGNOSIS — Z6833 Body mass index (BMI) 33.0-33.9, adult: Secondary | ICD-10-CM | POA: Diagnosis not present

## 2024-03-12 ENCOUNTER — Telehealth: Payer: Self-pay | Admitting: Pharmacy Technician

## 2024-03-12 ENCOUNTER — Other Ambulatory Visit: Payer: Self-pay

## 2024-03-12 MED ORDER — LOSARTAN POTASSIUM-HCTZ 100-25 MG PO TABS: 1.0000 | ORAL_TABLET | Freq: Every day | ORAL | 3 refills | Status: AC

## 2024-03-18 ENCOUNTER — Other Ambulatory Visit: Payer: Self-pay

## 2024-03-18 DIAGNOSIS — R3912 Poor urinary stream: Secondary | ICD-10-CM

## 2024-03-18 DIAGNOSIS — N138 Other obstructive and reflux uropathy: Secondary | ICD-10-CM

## 2024-05-13 LAB — BASIC METABOLIC PANEL WITH GFR
BUN/Creatinine Ratio: 20 (ref 10–24)
BUN: 23 mg/dL (ref 8–27)
CO2: 25 mmol/L (ref 20–29)
Calcium: 9.5 mg/dL (ref 8.6–10.2)
Chloride: 97 mmol/L (ref 96–106)
Creatinine, Ser: 1.16 mg/dL (ref 0.76–1.27)
Glucose: 98 mg/dL (ref 70–99)
Potassium: 3.6 mmol/L (ref 3.5–5.2)
Sodium: 143 mmol/L (ref 134–144)
eGFR: 67 mL/min/{1.73_m2}

## 2024-05-13 MED ORDER — AMLODIPINE BESYLATE 5 MG PO TABS: 5.0000 mg | ORAL_TABLET | Freq: Every day | ORAL | 3 refills | Status: AC

## 2024-05-13 MED ORDER — POTASSIUM CHLORIDE ER 20 MEQ PO TBCR: 20.0000 meq | EXTENDED_RELEASE_TABLET | Freq: Every day | ORAL | 3 refills | Status: AC

## 2024-05-13 NOTE — Addendum Note (Signed)
 Addended by: HANNAH MARCUS KIDD on: 05/13/2024 12:09 PM   Modules accepted: Orders

## 2024-05-25 ENCOUNTER — Ambulatory Visit: Admitting: Physician Assistant
